# Patient Record
Sex: Female | Born: 1965 | Race: White | Hispanic: No | Marital: Married | Smoking: Former smoker
Health system: Southern US, Community
[De-identification: ages and names within clinical notes are randomized; demographics above are authoritative.]

## PROBLEM LIST (undated history)

## (undated) DIAGNOSIS — K5792 Diverticulitis of intestine, part unspecified, without perforation or abscess without bleeding: Secondary | ICD-10-CM

## (undated) DIAGNOSIS — G40919 Epilepsy, unspecified, intractable, without status epilepticus: Secondary | ICD-10-CM

## (undated) DIAGNOSIS — F32A Depression, unspecified: Secondary | ICD-10-CM

## (undated) DIAGNOSIS — R296 Repeated falls: Secondary | ICD-10-CM

## (undated) DIAGNOSIS — K5190 Ulcerative colitis, unspecified, without complications: Secondary | ICD-10-CM

## (undated) DIAGNOSIS — F329 Major depressive disorder, single episode, unspecified: Secondary | ICD-10-CM

## (undated) DIAGNOSIS — K219 Gastro-esophageal reflux disease without esophagitis: Secondary | ICD-10-CM

## (undated) DIAGNOSIS — R569 Unspecified convulsions: Secondary | ICD-10-CM

## (undated) DIAGNOSIS — R2681 Unsteadiness on feet: Secondary | ICD-10-CM

## (undated) DIAGNOSIS — F4321 Adjustment disorder with depressed mood: Secondary | ICD-10-CM

## (undated) HISTORY — PX: OTHER SURGICAL HISTORY: SHX169

## (undated) HISTORY — PX: APPENDECTOMY: SHX54

## (undated) HISTORY — PX: TONSILLECTOMY: SUR1361

## (undated) HISTORY — PX: CHOLECYSTECTOMY: SHX55

---
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
## 1999-04-26 ENCOUNTER — Emergency Department (HOSPITAL_COMMUNITY): Admission: EM | Admit: 1999-04-26 | Discharge: 1999-04-26 | Payer: Self-pay | Admitting: Emergency Medicine

## 1999-06-02 ENCOUNTER — Inpatient Hospital Stay (HOSPITAL_COMMUNITY): Admission: EM | Admit: 1999-06-02 | Discharge: 1999-06-04 | Payer: Self-pay | Admitting: *Deleted

## 1999-06-02 ENCOUNTER — Encounter: Payer: Self-pay | Admitting: Emergency Medicine

## 2000-01-27 ENCOUNTER — Emergency Department (HOSPITAL_COMMUNITY): Admission: EM | Admit: 2000-01-27 | Discharge: 2000-01-27 | Payer: Self-pay | Admitting: Emergency Medicine

## 2003-01-18 ENCOUNTER — Encounter: Payer: Self-pay | Admitting: Emergency Medicine

## 2003-01-18 ENCOUNTER — Emergency Department (HOSPITAL_COMMUNITY): Admission: EM | Admit: 2003-01-18 | Discharge: 2003-01-18 | Payer: Self-pay | Admitting: Emergency Medicine

## 2003-04-11 ENCOUNTER — Emergency Department (HOSPITAL_COMMUNITY): Admission: EM | Admit: 2003-04-11 | Discharge: 2003-04-11 | Payer: Self-pay | Admitting: Emergency Medicine

## 2003-05-24 ENCOUNTER — Emergency Department (HOSPITAL_COMMUNITY): Admission: EM | Admit: 2003-05-24 | Discharge: 2003-05-24 | Payer: Self-pay | Admitting: Emergency Medicine

## 2003-07-13 ENCOUNTER — Emergency Department (HOSPITAL_COMMUNITY): Admission: AD | Admit: 2003-07-13 | Discharge: 2003-07-13 | Payer: Self-pay | Admitting: Family Medicine

## 2004-01-03 ENCOUNTER — Emergency Department (HOSPITAL_COMMUNITY): Admission: EM | Admit: 2004-01-03 | Discharge: 2004-01-04 | Payer: Self-pay | Admitting: Emergency Medicine

## 2004-01-25 ENCOUNTER — Emergency Department (HOSPITAL_COMMUNITY): Admission: EM | Admit: 2004-01-25 | Discharge: 2004-01-25 | Payer: Self-pay | Admitting: Emergency Medicine

## 2004-06-19 ENCOUNTER — Emergency Department (HOSPITAL_COMMUNITY): Admission: EM | Admit: 2004-06-19 | Discharge: 2004-06-19 | Payer: Self-pay | Admitting: Emergency Medicine

## 2005-02-12 ENCOUNTER — Emergency Department (HOSPITAL_COMMUNITY): Admission: EM | Admit: 2005-02-12 | Discharge: 2005-02-12 | Payer: Self-pay | Admitting: Emergency Medicine

## 2005-05-30 ENCOUNTER — Emergency Department (HOSPITAL_COMMUNITY): Admission: EM | Admit: 2005-05-30 | Discharge: 2005-05-30 | Payer: Self-pay | Admitting: Emergency Medicine

## 2005-07-02 ENCOUNTER — Emergency Department (HOSPITAL_COMMUNITY): Admission: EM | Admit: 2005-07-02 | Discharge: 2005-07-02 | Payer: Self-pay | Admitting: Emergency Medicine

## 2006-10-05 ENCOUNTER — Emergency Department (HOSPITAL_COMMUNITY): Admission: EM | Admit: 2006-10-05 | Discharge: 2006-10-06 | Payer: Self-pay | Admitting: Emergency Medicine

## 2009-06-06 HISTORY — PX: CRANIECTOMY / CRANIOTOMY FOR IMPLANTATION NEUROSTIMULATOR ELECTRODES: SUR321

## 2009-06-06 HISTORY — PX: OTHER SURGICAL HISTORY: SHX169

## 2013-02-10 ENCOUNTER — Emergency Department: Payer: Self-pay | Admitting: Emergency Medicine

## 2013-03-09 ENCOUNTER — Emergency Department: Payer: Self-pay | Admitting: Emergency Medicine

## 2014-02-03 ENCOUNTER — Emergency Department: Payer: Self-pay | Admitting: Emergency Medicine

## 2015-01-29 ENCOUNTER — Emergency Department
Admission: EM | Admit: 2015-01-29 | Discharge: 2015-01-29 | Disposition: A | Discharge status: Home / Self Care | Payer: Medicaid Other | Attending: Emergency Medicine | Admitting: Emergency Medicine

## 2015-01-29 ENCOUNTER — Emergency Department: Payer: Medicaid Other

## 2015-01-29 ENCOUNTER — Encounter: Payer: Self-pay | Admitting: Emergency Medicine

## 2015-01-29 DIAGNOSIS — Y998 Other external cause status: Secondary | ICD-10-CM | POA: Insufficient documentation

## 2015-01-29 DIAGNOSIS — Y9289 Other specified places as the place of occurrence of the external cause: Secondary | ICD-10-CM | POA: Diagnosis not present

## 2015-01-29 DIAGNOSIS — Y9389 Activity, other specified: Secondary | ICD-10-CM | POA: Insufficient documentation

## 2015-01-29 DIAGNOSIS — X58XXXA Exposure to other specified factors, initial encounter: Secondary | ICD-10-CM | POA: Diagnosis not present

## 2015-01-29 DIAGNOSIS — S0990XA Unspecified injury of head, initial encounter: Secondary | ICD-10-CM | POA: Diagnosis not present

## 2015-01-29 DIAGNOSIS — S0093XA Contusion of unspecified part of head, initial encounter: Secondary | ICD-10-CM | POA: Diagnosis not present

## 2015-01-29 DIAGNOSIS — R569 Unspecified convulsions: Secondary | ICD-10-CM

## 2015-01-29 HISTORY — DX: Diverticulitis of intestine, part unspecified, without perforation or abscess without bleeding: K57.92

## 2015-01-29 HISTORY — DX: Gastro-esophageal reflux disease without esophagitis: K21.9

## 2015-01-29 HISTORY — DX: Unspecified convulsions: R56.9

## 2015-01-29 LAB — URINALYSIS COMPLETE WITH MICROSCOPIC (ARMC ONLY)
Bacteria, UA: NONE SEEN
Bilirubin Urine: NEGATIVE
Glucose, UA: NEGATIVE mg/dL
Hgb urine dipstick: NEGATIVE
Ketones, ur: NEGATIVE mg/dL
Leukocytes, UA: NEGATIVE
Nitrite: NEGATIVE
Protein, ur: NEGATIVE mg/dL
Specific Gravity, Urine: 1.025 (ref 1.005–1.030)
pH: 6 (ref 5.0–8.0)

## 2015-01-29 NOTE — Discharge Instructions (Signed)
Head Injury You have a head injury. Headaches and throwing up (vomiting) are common after a head injury. It should be easy to wake up from sleeping. Sometimes you must stay in the hospital. Most problems happen within the first 24 hours. Side effects may occur up to 7-10 days after the injury.  WHAT ARE THE TYPES OF HEAD INJURIES? Head injuries can be as minor as a bump. Some head injuries can be more severe. More severe head injuries include:  A jarring injury to the brain (concussion).  A bruise of the brain (contusion). This mean there is bleeding in the brain that can cause swelling.  A cracked skull (skull fracture).  Bleeding in the brain that collects, clots, and forms a bump (hematoma). WHEN SHOULD I GET HELP RIGHT AWAY?   You are confused or sleepy.  You cannot be woken up.  You feel sick to your stomach (nauseous) or keep throwing up (vomiting).  Your dizziness or unsteadiness is getting worse.  You have very bad, lasting headaches that are not helped by medicine. Take medicines only as told by your doctor.  You cannot use your arms or legs like normal.  You cannot walk.  You notice changes in the black spots in the center of the colored part of your eye (pupil).  You have clear or bloody fluid coming from your nose or ears.  You have trouble seeing. During the next 24 hours after the injury, you must stay with someone who can watch you. This person should get help right away (call 911 in the [place] if you start to shake and are not able to control it (have seizures), you pass out, or you are unable to wake up. HOW CAN I PREVENT A HEAD INJURY IN THE FUTURE?  Wear seat belts.  Wear a helmet while bike riding and playing sports like football.  Stay away from dangerous activities around the house. WHEN CAN I RETURN TO NORMAL ACTIVITIES AND ATHLETICS? See your doctor before doing these activities. You should not do normal activities or play contact sports until 1 week  after the following symptoms have stopped:  Headache that does not go away.  Dizziness.  Poor attention.  Confusion.  Memory problems.  Sickness to your stomach or throwing up.  Tiredness.  Fussiness.  Bothered by bright lights or loud noises.  Anxiousness or depression.  Restless sleep. MAKE SURE YOU:   Understand these instructions.  Will watch your condition.  Will get help right away if you are not doing well or get worse. Document Released: 05/05/2008 Document Revised: 10/07/2013 Document Reviewed: 01/28/2013 [facility] Patient Information 2015 [place], [place]. This information is not intended to replace advice given to you by your health care provider. Make sure you discuss any questions you have with your health care provider.  Seizure, Adult A seizure is abnormal electrical activity in the brain. Seizures usually last from 30 seconds to 2 minutes. There are various types of seizures. Before a seizure, you may have a warning sensation (aura) that a seizure is about to occur. An aura may include the following symptoms:   Fear or anxiety.  Nausea.  Feeling like the room is spinning (vertigo).  Vision changes, such as seeing flashing lights or spots. Common symptoms during a seizure include:  A change in attention or behavior (altered mental status).  Convulsions with rhythmic jerking movements.  Drooling.  Rapid eye movements.  Grunting.  Loss of bladder and bowel control.  Bitter taste in the mouth.  Tongue biting. After a seizure, you may feel confused and sleepy. You may also have an injury resulting from convulsions during the seizure. HOME CARE INSTRUCTIONS   If you are given medicines, take them exactly as prescribed by your health care provider.  Keep all follow-up appointments as directed by your health care provider.  Do not swim or drive or engage in risky activity during which a seizure could cause further injury to you or others until  your health care provider says it is OK.  Get adequate rest.  Teach friends and family what to do if you have a seizure. They should:  Lay you on the ground to prevent a fall.  Put a cushion under your head.  Loosen any tight clothing around your neck.  Turn you on your side. If vomiting occurs, this helps keep your airway clear.  Stay with you until you recover.  Know whether or not you need emergency care. SEEK IMMEDIATE MEDICAL CARE IF:  The seizure lasts longer than 5 minutes.  The seizure is severe or you do not wake up immediately after the seizure.  You have an altered mental status after the seizure.  You are having more frequent or worsening seizures. Someone should drive you to the emergency department or call local emergency services (911 in [place] MAKE SURE YOU:  Understand these instructions.  Will watch your condition.  Will get help right away if you are not doing well or get worse. Document Released: 05/20/2000 Document Revised: 03/13/2013 Document Reviewed: 01/02/2013 [facility] Patient Information 2015 [place], [place]. This information is not intended to replace advice given to you by your health care provider. Make sure you discuss any questions you have with your health care provider.

## 2015-01-29 NOTE — ED Notes (Signed)
Pt. Walked into triage with no difficulty.  Pt. States having 3 seizures today, last one was at 7 pm tonight.  Pt. States she saw her primary today for hives today.  Pt. States she has 5-10 seizures a month.  Pt. States seeing her neurologist within last month.  Pt. States she took hydroxyzine today at around 10 am this morning.  Pt. States she uses a [place] nerve stimulator to help control seizures.

## 2015-01-29 NOTE — ED Provider Notes (Addendum)
[facility] Emergency Department Provider Note  ____________________________________________  Time seen: Approximately 9 PM  I have reviewed the triage vital signs and the nursing notes.   HISTORY  Chief Complaint Seizures    HPI Mandy Deleon is a 49 y.o. female with a history of cluster seizures who is presenting tonight with 4 seizures today as well as left wrist and left sided temple pain. She says that she has multiple types of seizures. Some seizures where she loses consciousness others where she is awake.She says that today she is not sure how she hurt her head. She says that she remembers having a seizure while she was in a chair and then the next time she was aware of her surroundings she was a different close. She suspects that she soiled herself. The patient is on multiple seizure medications as well as having a vagal nerve stimulator. She is seen at [facility] by Dr. Craig Staggers. She says that she hurt her head at about 11 AM this morning and then continued to seize 3 more times after that. She is at her baseline mental status now and is not confused. She says that she also has left-sided wrist pain where she thinks she fell.   Past Medical History  Diagnosis Date  . Seizures   . Diverticulitis   . Acid reflux     There are no active problems to display for this patient.   Past Surgical History  Procedure Laterality Date  . Cholecystectomy    . Vagas nerve stimulator  2011  . Appendectomy    . Tonsillectomy      No current outpatient prescriptions on file.  Allergies Review of patient's allergies indicates not on file.  No family history on file.  Social History Social History  Substance Use Topics  . Smoking status: Never Smoker   . Smokeless tobacco: None  . Alcohol Use: No    Review of Systems Constitutional: No fever/chills Eyes: No visual changes. ENT: No sore throat. Cardiovascular: Denies chest pain. Respiratory: Denies shortness  of breath. Gastrointestinal: No abdominal pain.  No nausea, no vomiting.  No diarrhea.  No constipation. Genitourinary: Negative for dysuria. Musculoskeletal: Negative for back pain. Skin: Excoriations to the bilateral upper extremities. Placed on hydroxyzine and took last dose at 10 AM this morning. Suspects that these lesions are irritation from swimming in a pool. Neurological: Negative for headaches, focal weakness or numbness.  10-point ROS otherwise negative.  ____________________________________________   PHYSICAL EXAM:  VITAL SIGNS: ED Triage Vitals  Enc Vitals Group     BP 01/29/15 2030 113/88 mmHg     Pulse Rate 01/29/15 2030 101     Resp 01/29/15 2030 18     Temp 01/29/15 2030 98.8 F (37.1 C)     Temp Source 01/29/15 2030 Oral     SpO2 01/29/15 2030 96 %     Weight 01/29/15 2030 165 lb (74.844 kg)     Height 01/29/15 2030  (1.6 m)     Head Cir --      Peak Flow --      Pain Score 01/29/15 2032 5     Pain Loc --      Pain Edu? --      Excl. in GC? --     Constitutional: Alert and oriented. Well appearing and in no acute distress. Eyes: Conjunctivae are normal. PERRL. EOMI. Head: Atraumatic. Nose: No congestion/rhinnorhea. Mouth/Throat: Mucous membranes are moist.  Oropharynx non-erythematous. Neck: No stridor.  No tenderness to palpation to the midline C-spine. Ranges freely without any neurologic deficits. Cardiovascular: Normal rate, regular rhythm. Grossly normal heart sounds.  Good peripheral circulation. Respiratory: Normal respiratory effort.  No retractions. Lungs CTAB. Gastrointestinal: Soft with mild suprapubic tenderness palpation. No distention. No abdominal bruits. No CVA tenderness. Musculoskeletal: No lower extremity tenderness nor edema.  No joint effusions. Left sided forearm and wrist without any signs of trauma. 5 out of 5 strength to bilateral upper extremities. No tenderness over the anatomic snuffbox or pain with axial load to the  thumbs. Neurologic:  Normal speech and language. No gross focal neurologic deficits are appreciated. No gait instability. Skin:  Skin is warm, dry and intact. Excoriations which are mild are noted to the bilateral upper extremities. There are no lesions on the trunk. No tracking of the lesions or mite like markings. Psychiatric: Mood and affect are normal. Speech and behavior are normal.  ____________________________________________   LABS (all labs ordered are listed, but only abnormal results are displayed)  Labs Reviewed  URINALYSIS COMPLETEWITH MICROSCOPIC ([facility] ONLY) - Abnormal; Notable for the following:    Color, Urine YELLOW (*)    APPearance HAZY (*)    Squamous Epithelial / LPF 0-5 (*)    All other components within normal limits  CBG MONITORING, ED   CBG of 93. ____________________________________________  EKG   ____________________________________________  RADIOLOGY  Normal head CT. ____________________________________________   PROCEDURES    ____________________________________________   INITIAL IMPRESSION / ASSESSMENT AND PLAN / ED COURSE  Pertinent labs & imaging results that were available during my care of the patient were reviewed by me and considered in my medical decision making (see chart for details).  Ordered urinalysis for this patient. She is not having any abdominal pain that she was aware of until I palpated the suprapubic region. She does have a history of diverticulitis but does not have any left lower quadrant or right lower quadrant tenderness at this time. Denies any nausea vomiting or diarrhea. Has had no appendectomy in the past.  ----------------------------------------- 9:38 PM on 01/29/2015 -----------------------------------------  Discussed the patient's case with Dr.Demarchena of [facility] neurology who reviewed the patient's records. He said that the patient does have clusters of seizures and that when this has happened in the past  she has not had her medications adjusted. He recommended follow-up at the [facility] clinic with Dr. Craig Staggers.   ----------------------------------------- 10:59 PM on 01/29/2015 -----------------------------------------  Patient without any seizures in the emergency department. Resting comfortably, reading on the emergency department bed. Discussed lab as well as imaging results with the patient. We also discussed the need for follow-up with her neurologist at [facility]. She is aware that the on-call neurologist did not recommend any medication changes. We also discussed not swimming as it would be dangerous for her since she has so frequent seizures. The patient understands the plan and is willing to comply. Heart rate 78 on recheck. Patient with no further comply to abdominal pain. Unclear cause. However patient is sipping soda in the room and has no signs of GI distress. ____________________________________________   FINAL CLINICAL IMPRESSION(S) / ED DIAGNOSES  Acute seizure. Acute head contusion. Initial visit.    Myrna Blazer, MD 01/29/15 2300  Myrna Blazer, MD 01/29/15 [phone]

## 2015-01-29 NOTE — ED Notes (Signed)
Pt. 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  Pt. States pain of 7/10.  Pt. Also states landing on lt. Wrist, pt. States pain of 5/10 to lt. Wrist.

## 2015-01-29 NOTE — ED Notes (Signed)
CBG 93 

## 2015-01-29 NOTE — ED Notes (Signed)
Patient transported to CT 

## 2015-01-29 NOTE — ED Notes (Signed)
Seizure pads in place, suction and O2 set up checked

## 2015-01-30 LAB — GLUCOSE, CAPILLARY: GLUCOSE-CAPILLARY: 93 mg/dL (ref 65–99)

## 2015-04-23 LAB — HM COLONOSCOPY

## 2015-08-21 ENCOUNTER — Encounter: Payer: Self-pay | Admitting: Emergency Medicine

## 2015-08-21 ENCOUNTER — Emergency Department
Admission: EM | Admit: 2015-08-21 | Discharge: 2015-08-21 | Disposition: A | Discharge status: Home / Self Care | Payer: Medicaid Other | Attending: Emergency Medicine | Admitting: Emergency Medicine

## 2015-08-21 DIAGNOSIS — Y999 Unspecified external cause status: Secondary | ICD-10-CM | POA: Insufficient documentation

## 2015-08-21 DIAGNOSIS — S01311A Laceration without foreign body of right ear, initial encounter: Secondary | ICD-10-CM | POA: Diagnosis not present

## 2015-08-21 DIAGNOSIS — Y929 Unspecified place or not applicable: Secondary | ICD-10-CM | POA: Insufficient documentation

## 2015-08-21 DIAGNOSIS — W19XXXA Unspecified fall, initial encounter: Secondary | ICD-10-CM | POA: Insufficient documentation

## 2015-08-21 DIAGNOSIS — Y939 Activity, unspecified: Secondary | ICD-10-CM | POA: Insufficient documentation

## 2015-08-21 MED ORDER — LIDOCAINE-EPINEPHRINE (PF) 1 %-1:200000 IJ SOLN
10.0000 mL | Freq: Once | INTRAMUSCULAR | Status: AC
  Administered 2015-08-21: 10 mL
  Filled 2015-08-21: qty 30

## 2015-08-21 NOTE — Discharge Instructions (Signed)
Laceration Care, Adult  A laceration is a cut that goes through all layers of the skin. The cut also goes into the tissue that is right under the skin. Some cuts heal on their own. Others need to be closed with stitches (sutures), staples, skin adhesive strips, or wound glue. Taking care of your cut lowers your risk of infection and helps your cut to heal better.  HOW TO TAKE CARE OF YOUR CUT  For stitches or staples:  · Keep the wound clean and dry.  · If you were given a bandage (dressing), you should change it at least one time per day or as told by your doctor. You should also change it if it gets wet or dirty.  · Keep the wound completely dry for the first 24 hours or as told by your doctor. After that time, you may take a shower or a bath. However, make sure that the wound is not soaked in water until after the stitches or staples have been removed.  · Clean the wound one time each day or as told by your doctor:    Wash the wound with soap and water.    Rinse the wound with water until all of the soap comes off.    Pat the wound dry with a clean towel. Do not rub the wound.  · After you clean the wound, put a thin layer of antibiotic ointment on it as told by your doctor. This ointment:    Helps to prevent infection.    Keeps the bandage from sticking to the wound.  · Have your stitches or staples removed as told by your doctor.  If your doctor used skin adhesive strips:   · Keep the wound clean and dry.  · If you were given a bandage, you should change it at least one time per day or as told by your doctor. You should also change it if it gets dirty or wet.  · Do not get the skin adhesive strips wet. You can take a shower or a bath, but be careful to keep the wound dry.  · If the wound gets wet, pat it dry with a clean towel. Do not rub the wound.  · Skin adhesive strips fall off on their own. You can trim the strips as the wound heals. Do not remove any strips that are still stuck to the wound. They will  fall off after a while.  If your doctor used wound glue:  · Try to keep your wound dry, but you may briefly wet it in the shower or bath. Do not soak the wound in water, such as by swimming.  · After you take a shower or a bath, gently pat the wound dry with a clean towel. Do not rub the wound.  · Do not do any activities that will make you really sweaty until the skin glue has fallen off on its own.  · Do not apply liquid, cream, or ointment medicine to your wound while the skin glue is still on.  · If you were given a bandage, you should change it at least one time per day or as told by your doctor. You should also change it if it gets dirty or wet.  · If a bandage is placed over the wound, do not let the tape for the bandage touch the skin glue.  · Do not pick at the glue. The skin glue usually stays on for 5-10 days. Then, it   or when wound glue stays in place and the wound is healed. Make sure to wear a sunscreen of at least 30 SPF.  Take over-the-counter and prescription medicines only as told by your doctor.  If you were given antibiotic medicine or ointment, take or apply it as told by your doctor. Do not stop using the antibiotic even if your wound is getting better.  Do not scratch or pick at the wound.  Keep all follow-up visits as told by your doctor. This is important.  Check your wound every day for signs of infection. Watch for:  Redness, swelling, or pain.  Fluid, blood, or pus.  Raise (elevate) the injured area above the level of your heart while you are sitting or lying down, if possible. GET HELP IF:  You got a tetanus shot and you have any of these problems at the injection site:  Swelling.  Very bad pain.  Redness.  Bleeding.  You have a fever.  A wound that was  closed breaks open.  You notice a bad smell coming from your wound or your bandage.  You notice something coming out of the wound, such as wood or glass.  Medicine does not help your pain.  You have more redness, swelling, or pain at the site of your wound.  You have fluid, blood, or pus coming from your wound.  You notice a change in the color of your skin near your wound.  You need to change the bandage often because fluid, blood, or pus is coming from the wound.  You start to have a new rash.  You start to have numbness around the wound. GET HELP RIGHT AWAY IF:  You have very bad swelling around the wound.  Your pain suddenly gets worse and is very bad.  You notice painful lumps near the wound or on skin that is anywhere on your body.  You have a red streak going away from your wound.  The wound is on your hand or foot and you cannot move a finger or toe like you usually can.  The wound is on your hand or foot and you notice that your fingers or toes look pale or bluish.   This information is not intended to replace advice given to you by your health care provider. Make sure you discuss any questions you have with your health care provider.   Document Released: 11/09/2007 Document Revised: 10/07/2014 Document Reviewed: 05/19/2014 [employer] Interactive Patient Education 2016 [employer]    WOUND CARE Please return in 10 days to have your stitches/staples removed or sooner if you have concerns.  Keep area clean and dry for 24 hours. Do not remove bandage, if applied.  After 24 hours, remove bandage and wash wound gently with mild soap and warm water. Reapply a new bandage after cleaning wound, if directed.  Continue daily cleansing with soap and water until stitches/staples are removed.  Do not apply any ointments or creams to the wound while stitches/staples are in place, as this may cause delayed healing.  Notify the office if you experience any of the  following signs of infection: Swelling, redness, pus drainage, streaking, fever >101.0 F  Notify the office if you experience excessive bleeding that does not stop after 15-20 minutes of constant, firm pressure.

## 2015-08-21 NOTE — ED Notes (Signed)
States she has a hx of sz and had 1 this afternoon   Laceration to right ear  And pain to right arm

## 2015-08-21 NOTE — ED Notes (Signed)
Pt sts that she cut L ear during seizure this AM.  Pt sts that it is not uncommon for her to have seizures, that she is compliant w/ medications

## 2015-08-21 NOTE — ED Provider Notes (Signed)
[facility] Emergency Department Provider Note ____________________________________________  Time seen: Approximately 3:08 PM  I have reviewed the triage vital signs and the nursing notes.   HISTORY  Chief Complaint Fall   HPI Mandy Deleon is a 50 y.o. female is here after having a seizure this morning. Patient states that she has had some right elbow pain which is more bruised than anything and she still able to move her elbow without any discomfort or restrictions. She does have a laceration to her right ear and and it continues to bleed somewhat. Denies any other injuries.Patient states that her epilepsy has been very difficult to control even with medication. She states that she had as many as over 200 seizures per month and currently with a vagal stimulator implant she is down to 7 per month.   Past Medical History  Diagnosis Date  . Seizures (HCC)   . Diverticulitis   . Acid reflux     There are no active problems to display for this patient.   Past Surgical History  Procedure Laterality Date  . Cholecystectomy    . Vagas nerve stimulator  2011  . Appendectomy    . Tonsillectomy      No current outpatient prescriptions on file.  Allergies Codeine  No family history on file.  Social History Social History  Substance Use Topics  . Smoking status: Never Smoker   . Smokeless tobacco: None  . Alcohol Use: No    Review of Systems Constitutional: No fever/chills Eyes: No visual changes. ENT: No sore throat. Cardiovascular: Denies chest pain. Respiratory: Denies shortness of breath. Gastrointestinal: No abdominal pain.  No nausea, no vomiting.   Musculoskeletal: Negative for back pain.Positive right elbow pain. Skin: Negative for rash. Positive for laceration Neurological: Negative for headaches, focal weakness or numbness.  10-point ROS otherwise negative.  ____________________________________________   PHYSICAL EXAM:  VITAL  SIGNS: ED Triage Vitals  Enc Vitals Group     BP 08/21/15 1451 120/73 mmHg     Pulse Rate 08/21/15 1451 98     Resp 08/21/15 1451 20     Temp 08/21/15 1451 98 F (36.7 C)     Temp Source 08/21/15 1451 Oral     SpO2 08/21/15 1451 100 %     Weight 08/21/15 1451 165 lb (74.844 kg)     Height 08/21/15 1451 5\' 3"  (1.6 m)     Head Cir --      Peak Flow --      Pain Score --      Pain Loc --      Pain Edu? --      Excl. in GC? --     Constitutional: Alert and oriented. Well appearing and in no acute distress. Eyes: Conjunctivae are normal. PERRL. EOMI. Head: Atraumatic. Nose: No congestion/rhinnorhea. Mouth/Throat: Mucous membranes are moist.  Oropharynx non-erythematous. Neck: No stridor.  No cervical tenderness on palpation posteriorly. Range of motion is without pain or restriction. Cardiovascular: Normal rate, regular rhythm. Grossly normal heart sounds.  Good peripheral circulation. Respiratory: Normal respiratory effort.  No retractions. Lungs CTAB. Gastrointestinal: Soft and nontender. No distention.  Musculoskeletal: Patient moves upper and lower extremities without any difficulty. There is a small ecchymotic area to the posterior portion of the right elbow with minimal tenderness on palpation. Range of motion is without restriction or pain. There is no soft tissue edema present. Neurologic:  Normal speech and language. No gross focal neurologic deficits are appreciated. No gait instability.  Skin:  Skin is warm, dry and intact. Laceration to the right earlobe without active bleeding at this time. Psychiatric: Mood and affect are normal. Speech and behavior are normal.  ____________________________________________   LABS (all labs ordered are listed, but only abnormal results are displayed)  Labs Reviewed - No data to display  PROCEDURES  Procedure(s) performed: LACERATION REPAIR Performed by: Tommi Rumps Authorized by: Tommi Rumps Consent: Verbal consent  obtained. Risks and benefits: risks, benefits and alternatives were discussed Consent given by: patient Patient identity confirmed: provided demographic data Prepped and Draped in normal sterile fashion Wound explored  Laceration Location: Right earlobe  Laceration Length: 2 cm  No Foreign Bodies seen or palpated  Anesthesia: local infiltration  Local anesthetic: lidocaine 1 % with epinephrine  Anesthetic total: 2 ml  Irrigation method: syringe Amount of cleaning: standard  Skin closure: 5-0 Ethilon   Number of sutures: 7  Technique: Simple interrupted   Patient tolerance: Patient tolerated the procedure well with no immediate complications.  Critical Care performed: No  ____________________________________________   INITIAL IMPRESSION / ASSESSMENT AND PLAN / ED COURSE  Pertinent labs & imaging results that were available during my care of the patient were reviewed by me and considered in my medical decision making (see chart for details).  Patient is return for suture removal if she is unable to get her family doctor to remove these in 10 days. She'll take Tylenol as needed and use ice for any bruises. ____________________________________________   FINAL CLINICAL IMPRESSION(S) / ED DIAGNOSES  Final diagnoses:  Laceration of right ear lobe, initial encounter      Tommi Rumps, PA-C 08/21/15 1646  Phineas Semen, MD 08/21/15 [phone]

## 2015-09-01 ENCOUNTER — Emergency Department
Admission: EM | Admit: 2015-09-01 | Discharge: 2015-09-01 | Disposition: A | Discharge status: Home / Self Care | Payer: Medicaid Other | Attending: Emergency Medicine | Admitting: Emergency Medicine

## 2015-09-01 ENCOUNTER — Encounter: Payer: Self-pay | Admitting: Emergency Medicine

## 2015-09-01 DIAGNOSIS — Z4802 Encounter for removal of sutures: Secondary | ICD-10-CM | POA: Insufficient documentation

## 2015-09-01 DIAGNOSIS — R569 Unspecified convulsions: Secondary | ICD-10-CM | POA: Diagnosis not present

## 2015-09-01 NOTE — ED Provider Notes (Signed)
[facility] Emergency Department Provider Note  ____________________________________________  Time seen: Approximately 2:14 PM  I have reviewed the triage vital signs and the nursing notes.   HISTORY  Chief Complaint Suture / Staple Removal    HPI Mandy Deleon is a 50 y.o. female presents for suture removal. Patient had 7 stitches placed in her right ear proximal wound control. Denies any complaints at this time.   Past Medical History  Diagnosis Date  . Seizures (HCC)   . Diverticulitis   . Acid reflux     There are no active problems to display for this patient.   Past Surgical History  Procedure Laterality Date  . Cholecystectomy    . Vagas nerve stimulator  2011  . Appendectomy    . Tonsillectomy      No current outpatient prescriptions on file.  Allergies Codeine  No family history on file.  Social History Social History  Substance Use Topics  . Smoking status: Never Smoker   . Smokeless tobacco: None  . Alcohol Use: No    Review of Systems Constitutional: No fever/chills Skin: Positive for stitches right ear. Neurological: Negative for headaches, focal weakness or numbness.  10-point ROS otherwise negative.  ____________________________________________   PHYSICAL EXAM:  VITAL SIGNS: ED Triage Vitals  Enc Vitals Group     BP 09/01/15 1333 123/67 mmHg     Pulse Rate 09/01/15 1333 95     Resp 09/01/15 1333 16     Temp 09/01/15 1333 98.3 F (36.8 C)     Temp Source 09/01/15 1333 Oral     SpO2 09/01/15 1333 100 %     Weight 09/01/15 1333 173 lb (78.472 kg)     Height 09/01/15 1333 5\' 3"  (1.6 m)     Head Cir --      Peak Flow --      Pain Score --      Pain Loc --      Pain Edu? --      Excl. in GC? --     Constitutional: Alert and oriented. Well appearing and in no acute distress. Head: Atraumatic. Right ear with 7 sutures intact. No erythema or drainage or redness noted. Nose: No  congestion/rhinnorhea. Skin:  Skin is warm, dry and intact. No rash noted. Psychiatric: Mood and affect are normal. Speech and behavior are normal.  ____________________________________________   LABS (all labs ordered are listed, but only abnormal results are displayed)  Labs Reviewed - No data to display ____________________________________________    PROCEDURES  Procedure(s) performed: None  Critical Care performed: No  ____________________________________________   INITIAL IMPRESSION / ASSESSMENT AND PLAN / ED COURSE  Pertinent labs & imaging results that were available during my care of the patient were reviewed by me and considered in my medical decision making (see chart for details).  Suture removal right ear. Patient follow-up PCP or return to the ER with any new illnesses. Patient tolerated procedure well. Sutures were removed by ER nurse. ____________________________________________   FINAL CLINICAL IMPRESSION(S) / ED DIAGNOSES  Final diagnoses:  Visit for suture removal     This chart was dictated using voice recognition software/Dragon. Despite best efforts to proofread, errors can occur which can change the meaning. Any change was purely unintentional.   Evangeline Dakinharles M Princessa Lesmeister, PA-C 09/01/15 1706  Rockne MenghiniAnne-Caroline Norman, MD 09/02/15 [phone]

## 2015-09-01 NOTE — Discharge Instructions (Signed)

## 2015-09-01 NOTE — ED Notes (Signed)
Pt here for suture removal

## 2016-06-01 ENCOUNTER — Emergency Department: Payer: Medicaid Other

## 2016-06-01 ENCOUNTER — Emergency Department
Admission: EM | Admit: 2016-06-01 | Discharge: 2016-06-01 | Disposition: A | Discharge status: Home / Self Care | Payer: Medicaid Other | Attending: Emergency Medicine | Admitting: Emergency Medicine

## 2016-06-01 ENCOUNTER — Encounter: Payer: Self-pay | Admitting: Emergency Medicine

## 2016-06-01 DIAGNOSIS — Y929 Unspecified place or not applicable: Secondary | ICD-10-CM | POA: Insufficient documentation

## 2016-06-01 DIAGNOSIS — S0181XA Laceration without foreign body of other part of head, initial encounter: Secondary | ICD-10-CM | POA: Insufficient documentation

## 2016-06-01 DIAGNOSIS — W1800XA Striking against unspecified object with subsequent fall, initial encounter: Secondary | ICD-10-CM | POA: Insufficient documentation

## 2016-06-01 DIAGNOSIS — S0990XA Unspecified injury of head, initial encounter: Secondary | ICD-10-CM | POA: Diagnosis present

## 2016-06-01 DIAGNOSIS — Y999 Unspecified external cause status: Secondary | ICD-10-CM | POA: Diagnosis not present

## 2016-06-01 DIAGNOSIS — G40909 Epilepsy, unspecified, not intractable, without status epilepticus: Secondary | ICD-10-CM | POA: Insufficient documentation

## 2016-06-01 DIAGNOSIS — Y9389 Activity, other specified: Secondary | ICD-10-CM | POA: Insufficient documentation

## 2016-06-01 DIAGNOSIS — Z87891 Personal history of nicotine dependence: Secondary | ICD-10-CM | POA: Insufficient documentation

## 2016-06-01 HISTORY — DX: Ulcerative colitis, unspecified, without complications: K51.90

## 2016-06-01 LAB — CBC
HEMATOCRIT: 40.8 % (ref 35.0–47.0)
Hemoglobin: 14.1 g/dL (ref 12.0–16.0)
MCH: 32.7 pg (ref 26.0–34.0)
MCHC: 34.6 g/dL (ref 32.0–36.0)
MCV: 94.3 fL (ref 80.0–100.0)
PLATELETS: 286 10*3/uL (ref 150–440)
RBC: 4.32 MIL/uL (ref 3.80–5.20)
RDW: 13.2 % (ref 11.5–14.5)
WBC: 7.7 10*3/uL (ref 3.6–11.0)

## 2016-06-01 LAB — BASIC METABOLIC PANEL
Anion gap: 6 (ref 5–15)
BUN: 10 mg/dL (ref 6–20)
CHLORIDE: 103 mmol/L (ref 101–111)
CO2: 26 mmol/L (ref 22–32)
CREATININE: 0.78 mg/dL (ref 0.44–1.00)
Calcium: 8.4 mg/dL — ABNORMAL LOW (ref 8.9–10.3)
GFR calc Af Amer: >60 mL/min (ref >60)
GLUCOSE: 101 mg/dL — AB (ref 65–99)
POTASSIUM: 3.6 mmol/L (ref 3.5–5.1)
Sodium: 135 mmol/L (ref 135–145)

## 2016-06-01 LAB — URINALYSIS, COMPLETE (UACMP) WITH MICROSCOPIC
Bilirubin Urine: NEGATIVE
Glucose, UA: NEGATIVE mg/dL
Ketones, ur: NEGATIVE mg/dL
Leukocytes, UA: NEGATIVE
Nitrite: NEGATIVE
PH: 6 (ref 5.0–8.0)
Protein, ur: NEGATIVE mg/dL
SPECIFIC GRAVITY, URINE: 1.011 (ref 1.005–1.030)

## 2016-06-01 LAB — POC URINE PREG, ED: PREG TEST UR: NEGATIVE

## 2016-06-01 MED ORDER — LEVETIRACETAM 500 MG/5ML IV SOLN
1000.0000 mg | INTRAVENOUS | Status: AC
  Administered 2016-06-01: 1000 mg via INTRAVENOUS
  Filled 2016-06-01: qty 10

## 2016-06-01 NOTE — ED Triage Notes (Signed)
Patient states that she has had 2 seizures since 18:45 tonight. Patient states that she has a history of seizures and that her last seizure was about 2 weeks ago. Patient states that she hit her head during the first seizure. Patient with lacartion above right eye and bruising to right eye. Patient denies taking any anticoagulants.

## 2016-06-01 NOTE — ED Provider Notes (Signed)
[facility] Emergency Department Provider Note   ____________________________________________   First MD Initiated Contact with Patient 06/01/16 2307     (approximate)  I have reviewed the triage vital signs and the nursing notes.   HISTORY  Chief Complaint Seizures and Head Injury    HPI Mandy Deleon is a 50 y.o. female who presents to the ED from home with a chief complaint of seizure. Patient has a long-standing history of seizures, on 3 antiepileptics as well as vagal nerve stimulator. Reports 2 seizures since 82645 PMwhich is not unusual for her. Struck her head above her right eyebrow and presents a laceration and bruising to her right eye. Denies taking anticoagulants. Reports taking medications as directed by her neurologist. Denies recent fever, chills, chest pain, shortness of breath, abdominal pain, nausea, vomiting, diarrhea. Denies recent travel. Nothing makes her symptoms better or worse. Tetanus is up-to-date.   Past Medical History:  Diagnosis Date  . Acid reflux   . Diverticulitis   . Seizures (HCC)   . Ulcerative colitis (HCC)     There are no active problems to display for this patient.   Past Surgical History:  Procedure Laterality Date  . APPENDECTOMY    . CHOLECYSTECTOMY    . TONSILLECTOMY    . vagas nerve stimulator  2011    Prior to Admission medications   Not on File    Allergies Codeine and Morphine and related  No family history on file.  Social History Social History  Substance Use Topics  . Smoking status: Former Games developermoker  . Smokeless tobacco: Never Used  . Alcohol use No    Review of Systems  Constitutional: No fever/chills. Eyes: No visual changes. ENT: Positive for facial laceration. No sore throat. Cardiovascular: Denies chest pain. Respiratory: Denies shortness of breath. Gastrointestinal: No abdominal pain.  No nausea, no vomiting.  No diarrhea.  No constipation. Genitourinary: Negative for  dysuria. Musculoskeletal: Negative for back pain. Skin: Negative for rash. Neurological: Positive for seizure. Negative for headaches, focal weakness or numbness.  10-point ROS otherwise negative.  ____________________________________________   PHYSICAL EXAM:  VITAL SIGNS: ED Triage Vitals  Enc Vitals Group     BP 06/01/16 2037 138/83     Pulse Rate 06/01/16 2037 (!) 114     Resp 06/01/16 2037 18     Temp 06/01/16 2037 98.3 F (36.8 C)     Temp Source 06/01/16 2037 Oral     SpO2 06/01/16 2037 99 %     Weight 06/01/16 2042 170 lb (77.1 kg)     Height 06/01/16 2042 5\' 3"  (1.6 m)     Head Circumference --      Peak Flow --      Pain Score 06/01/16 2042 7     Pain Loc --      Pain Edu? --      Excl. in GC? --     Constitutional: Alert and oriented. Well appearing and in no acute distress. Eyes: Conjunctivae are normal. PERRL. EOMI. Head: 1 cm well approximated superficial laceration above right lateral eyebrow which is nonbleeding and non-gaping. Nose: No congestion/rhinnorhea. Mouth/Throat: Mucous membranes are moist.  Oropharynx non-erythematous. Neck: No stridor.  No cervical spine tenderness to palpation. Cardiovascular: Normal rate, regular rhythm. Grossly normal heart sounds.  Good peripheral circulation. Respiratory: Normal respiratory effort.  No retractions. Lungs CTAB. Gastrointestinal: Soft and nontender. No distention. No abdominal bruits. No CVA tenderness. Musculoskeletal: No lower extremity tenderness nor edema.  No  joint effusions. Neurologic:  Normal speech and language. No gross focal neurologic deficits are appreciated. No gait instability. Ambulates to commode with steady gait. Skin:  Skin is warm, dry and intact. No rash noted. Psychiatric: Mood and affect are normal. Speech and behavior are normal.  ____________________________________________   LABS (all labs ordered are listed, but only abnormal results are displayed)  Labs Reviewed  BASIC  METABOLIC PANEL - Abnormal; Notable for the following:       Result Value   Glucose, Bld 101 (*)    Calcium 8.4 (*)    All other components within normal limits  URINALYSIS, COMPLETE (UACMP) WITH MICROSCOPIC - Abnormal; Notable for the following:    Color, Urine YELLOW (*)    APPearance CLEAR (*)    Hgb urine dipstick SMALL (*)    Bacteria, UA RARE (*)    Squamous Epithelial / LPF 0-5 (*)    All other components within normal limits  CBC  POC URINE PREG, ED   ____________________________________________  EKG  ED ECG REPORT I, Blakley Michna J, the attending physician, personally viewed and interpreted this ECG.   Date: 06/01/2016  EKG Time: 2128  Rate: 93  Rhythm: normal EKG, normal sinus rhythm  Axis: Normal  Intervals:none  ST&T Change: Nonspecific  ____________________________________________  RADIOLOGY  CT head without contrast interpreted per Dr. Manus GunningEhinger: No acute intracranial abnormality. ____________________________________________   PROCEDURES  Procedure(s) performed: None  Procedures  Critical Care performed: No  ____________________________________________   INITIAL IMPRESSION / ASSESSMENT AND PLAN / ED COURSE  Pertinent labs & imaging results that were available during my care of the patient were reviewed by me and considered in my medical decision making (see chart for details).  50 year old female with an extensive history of seizure disorder who presents with seizure. Laboratory, urinalysis and imaging studies are unremarkable. She was given IV Keppra prior to my arrival. Discussed with patient increasing her Keppra dose; she is unwilling to change any of her medications at this time. She tells me her neurologist is trying to titrate her off of Keppra while increasing her other medications. She has a scheduled follow-up appointment on January 10 and wishes to maintain her current medications until she sees her neurologist. Superficial laceration was  cleansed and Steri-Strip applied by nurse. Strict return precautions given. Patient verbalizes understanding and agrees with plan of care.  Clinical Course      ____________________________________________   FINAL CLINICAL IMPRESSION(S) / ED DIAGNOSES  Final diagnoses:  Seizure disorder (HCC)  Minor head injury, initial encounter  Facial laceration, initial encounter      NEW MEDICATIONS STARTED DURING THIS VISIT:  New Prescriptions   No medications on file     Note:  This document was prepared using Dragon voice recognition software and may include unintentional dictation errors.    Irean HongJade J Laasya Peyton, MD 06/02/16 [phone]

## 2016-06-01 NOTE — ED Notes (Signed)
Dr. Sung at bedside.  

## 2016-06-01 NOTE — ED Notes (Signed)
Pt up to toilet and reports dizziness, pt reports dizziness earlier today as well

## 2016-06-01 NOTE — ED Notes (Signed)
Blankets placed on side rails for safety, suction and oxygen available in room

## 2016-06-01 NOTE — Discharge Instructions (Signed)
Continue all medications as directed by your neurologist. Steri-Strip will fall off by itself. Return to the ER for recurrent or worsening symptoms, persistent vomiting, difficult breathing or other concerns.

## 2016-06-01 NOTE — ED Notes (Signed)
Forehead cleaned with sterile saline, 2 steri-strips applied

## 2016-06-27 ENCOUNTER — Emergency Department: Payer: Medicaid Other

## 2016-06-27 ENCOUNTER — Emergency Department
Admission: EM | Admit: 2016-06-27 | Discharge: 2016-06-28 | Disposition: A | Discharge status: Home / Self Care | Payer: Medicaid Other | Attending: Emergency Medicine | Admitting: Emergency Medicine

## 2016-06-27 ENCOUNTER — Encounter: Payer: Self-pay | Admitting: Emergency Medicine

## 2016-06-27 DIAGNOSIS — W1809XA Striking against other object with subsequent fall, initial encounter: Secondary | ICD-10-CM | POA: Insufficient documentation

## 2016-06-27 DIAGNOSIS — G40909 Epilepsy, unspecified, not intractable, without status epilepticus: Secondary | ICD-10-CM | POA: Diagnosis not present

## 2016-06-27 DIAGNOSIS — Z87891 Personal history of nicotine dependence: Secondary | ICD-10-CM | POA: Diagnosis not present

## 2016-06-27 DIAGNOSIS — Y999 Unspecified external cause status: Secondary | ICD-10-CM | POA: Insufficient documentation

## 2016-06-27 DIAGNOSIS — S0990XA Unspecified injury of head, initial encounter: Secondary | ICD-10-CM | POA: Diagnosis not present

## 2016-06-27 DIAGNOSIS — R569 Unspecified convulsions: Secondary | ICD-10-CM

## 2016-06-27 DIAGNOSIS — Y92031 Bathroom in apartment as the place of occurrence of the external cause: Secondary | ICD-10-CM | POA: Insufficient documentation

## 2016-06-27 DIAGNOSIS — Y939 Activity, unspecified: Secondary | ICD-10-CM | POA: Diagnosis not present

## 2016-06-27 LAB — CBC WITH DIFFERENTIAL/PLATELET
BASOS ABS: 0 10*3/uL (ref 0–0.1)
Basophils Relative: 1 %
EOS ABS: 0 10*3/uL (ref 0–0.7)
EOS PCT: 0 %
HCT: 41.4 % (ref 35.0–47.0)
Hemoglobin: 14.2 g/dL (ref 12.0–16.0)
Lymphocytes Relative: 35 %
Lymphs Abs: 2 10*3/uL (ref 1.0–3.6)
MCH: 32.5 pg (ref 26.0–34.0)
MCHC: 34.3 g/dL (ref 32.0–36.0)
MCV: 94.8 fL (ref 80.0–100.0)
MONO ABS: 0.6 10*3/uL (ref 0.2–0.9)
Monocytes Relative: 11 %
Neutro Abs: 3.1 10*3/uL (ref 1.4–6.5)
Neutrophils Relative %: 53 %
PLATELETS: 296 10*3/uL (ref 150–440)
RBC: 4.37 MIL/uL (ref 3.80–5.20)
RDW: 12.9 % (ref 11.5–14.5)
WBC: 5.7 10*3/uL (ref 3.6–11.0)

## 2016-06-27 LAB — URINALYSIS, COMPLETE (UACMP) WITH MICROSCOPIC
Bilirubin Urine: NEGATIVE
GLUCOSE, UA: NEGATIVE mg/dL
KETONES UR: NEGATIVE mg/dL
Leukocytes, UA: NEGATIVE
Nitrite: NEGATIVE
Protein, ur: NEGATIVE mg/dL
SPECIFIC GRAVITY, URINE: 1.017 (ref 1.005–1.030)
pH: 5 (ref 5.0–8.0)

## 2016-06-27 LAB — BASIC METABOLIC PANEL
ANION GAP: 10 (ref 5–15)
BUN: 11 mg/dL (ref 6–20)
CO2: 22 mmol/L (ref 22–32)
Calcium: 8.5 mg/dL — ABNORMAL LOW (ref 8.9–10.3)
Chloride: 101 mmol/L (ref 101–111)
Creatinine, Ser: 0.73 mg/dL (ref 0.44–1.00)
GFR calc Af Amer: >60 mL/min (ref >60)
Glucose, Bld: 95 mg/dL (ref 65–99)
POTASSIUM: 3.3 mmol/L — AB (ref 3.5–5.1)
SODIUM: 133 mmol/L — AB (ref 135–145)

## 2016-06-27 MED ORDER — LEVETIRACETAM 500 MG PO TABS
1500.0000 mg | ORAL_TABLET | Freq: Once | ORAL | Status: AC
  Administered 2016-06-27: 1500 mg via ORAL
  Filled 2016-06-27: qty 3

## 2016-06-27 NOTE — ED Provider Notes (Signed)
[facility] Emergency Department Provider Note   ____________________________________________   First MD Initiated Contact with Patient 06/27/16 2224     (approximate)  I have reviewed the triage vital signs and the nursing notes.   HISTORY  Chief Complaint Seizures and Head Injury    HPI Mandy Deleon is a 51 y.o. female who presents to the emergency department for evaluation after having 2 seizures within the past 12 hours. One seizure was last night where she must have fallen and hit her head in the bathroom on the counter or bathtub. She has a long-standing history of epilepsy and denies missing any doses of her medications. She does state that her neurologist is attempting to decrease her dose of Keppra, but denies any recent changes to her medication regimen. She states that she did follow up with him on January 10 as scheduled. No medication changes at that visit. Since the most recent seizure at around 420 this afternoon, she has returned to her baseline and now denies confusion, nausea, vomiting, dizziness, or any symptom outside of her normal postictal state. She is mainly concerned about a contusion and "dent" on the left side of her forehead.   Past Medical History:  Diagnosis Date  . Acid reflux   . Diverticulitis   . Seizures (HCC)   . Ulcerative colitis (HCC)     There are no active problems to display for this patient.   Past Surgical History:  Procedure Laterality Date  . APPENDECTOMY    . CHOLECYSTECTOMY    . TONSILLECTOMY    . vagas nerve stimulator  2011    Prior to Admission medications   Not on File    Allergies Adhesive [tape]; Codeine; Morphine and related; and Iodine  No family history on file.  Social History Social History  Substance Use Topics  . Smoking status: Former Games developer  . Smokeless tobacco: Never Used  . Alcohol use No    Review of Systems Constitutional: No fever/chills Eyes: No visual  changes. ENT: No sore throat. Cardiovascular: Denies chest pain. Respiratory: Denies shortness of breath. Gastrointestinal: No abdominal pain.  No nausea, no vomiting.  No diarrhea. Genitourinary: Negative for dysuria. Musculoskeletal: Negative for back pain. Skin: Negative for rash, lesion, or wound. Neurological: Negative for headaches, focal weakness or numbness. ____________________________________________   PHYSICAL EXAM:  VITAL SIGNS: ED Triage Vitals [06/27/16 1849]  Enc Vitals Group     BP 127/67     Pulse Rate 95     Resp 18     Temp 98.1 F (36.7 C)     Temp Source Oral     SpO2 100 %     Weight 170 lb (77.1 kg)     Height 5\' 3"  (1.6 m)     Head Circumference      Peak Flow      Pain Score 5     Pain Loc      Pain Edu?      Excl. in GC?     Constitutional: Alert and oriented. Well appearing and in no acute distress. Eyes: Conjunctivae are normal. PERRL. EOMI. Head: Small contusion divided by a depression in the center. No bony  Nose: No congestion/rhinnorhea. Mouth/Throat: Mucous membranes are moist.  Oropharynx non-erythematous. Neck: No stridor.   Cardiovascular: Normal rate, regular rhythm. Grossly normal heart sounds.  Good peripheral circulation. Respiratory: Normal respiratory effort.  No retractions. Lungs CTAB. Gastrointestinal: Soft and nontender. No distention. No abdominal bruits. No CVA tenderness.  Musculoskeletal: No lower extremity tenderness nor edema.  No joint effusions. Neurologic:  Normal speech and language. No gross focal neurologic deficits are appreciated. No gait instability. Skin:  Skin is warm, dry and intact. No rash noted. Psychiatric: Mood and affect are normal. Speech and behavior are normal.  ____________________________________________   LABS (all labs ordered are listed, but only abnormal results are displayed)  Labs Reviewed  BASIC METABOLIC PANEL - Abnormal; Notable for the following:       Result Value   Sodium 133  (*)    Potassium 3.3 (*)    Calcium 8.5 (*)    All other components within normal limits  URINALYSIS, COMPLETE (UACMP) WITH MICROSCOPIC - Abnormal; Notable for the following:    Color, Urine YELLOW (*)    APPearance CLEAR (*)    Hgb urine dipstick SMALL (*)    Bacteria, UA RARE (*)    Squamous Epithelial / LPF 0-5 (*)    All other components within normal limits  CBC WITH DIFFERENTIAL/PLATELET   ____________________________________________  EKG   ____________________________________________  RADIOLOGY  Facial bones negative for displaced skull fracture per radiology. ____________________________________________   PROCEDURES  Procedure(s) performed: None  Procedures  Critical Care performed: No  ____________________________________________   INITIAL IMPRESSION / ASSESSMENT AND PLAN / ED COURSE  Pertinent labs & imaging results that were available during my care of the patient were reviewed by me and considered in my medical decision making (see chart for details).  Patient was given 1500 mg of Keppra while in the emergency department and advised that she will need to contact her neurologist for further instruction on any medication adjustments. She states that she is unable to tolerate diazepam and most other aniti-seizure medications. She states it causes her to feel like she is floating. She was instructed to return to the ER for symptoms that change or worsen if unable to schedule an appointment.      ____________________________________________   FINAL CLINICAL IMPRESSION(S) / ED DIAGNOSES  Final diagnoses:  Seizure (HCC)  Closed head injury, initial encounter      NEW MEDICATIONS STARTED DURING THIS VISIT:  There are no discharge medications for this patient.    Note:  This document was prepared using Dragon voice recognition software and may include unintentional dictation errors.    Chinita PesterCari B Crist Kruszka, FNP 06/29/16 1124    Jennye MoccasinBrian S Quigley,  MD 07/01/16 1444

## 2016-06-27 NOTE — ED Notes (Signed)
Pt. States during seizure last night must have fallen onto counter or tub in bathroom across from of throat, c/o pain, no difficulty swallowing. Appears WNL  Pt. States hitting head today during seizure, bruising present to left forehead.

## 2016-06-27 NOTE — ED Triage Notes (Signed)
Patient states she has had two seizures in the past 12 hours.  Last one was today around 1620.  Patient states today she hit head.  Patient has history of epilepsy and usually will have 7-8 seizures per month.  Reports no medicine changes.

## 2016-06-27 NOTE — Discharge Instructions (Signed)
Please call your neurologist's office tomorrow and advise him of your most recent seizure events. Return to the ER for symptoms that change or worsen or for new concerns.

## 2019-02-16 ENCOUNTER — Emergency Department
Admission: EM | Admit: 2019-02-16 | Discharge: 2019-02-16 | Disposition: A | Discharge status: Home / Self Care | Payer: Medicare Other | Attending: Emergency Medicine | Admitting: Emergency Medicine

## 2019-02-16 ENCOUNTER — Other Ambulatory Visit: Payer: Self-pay

## 2019-02-16 ENCOUNTER — Emergency Department: Payer: Medicare Other

## 2019-02-16 DIAGNOSIS — X58XXXA Exposure to other specified factors, initial encounter: Secondary | ICD-10-CM | POA: Diagnosis not present

## 2019-02-16 DIAGNOSIS — Z87891 Personal history of nicotine dependence: Secondary | ICD-10-CM | POA: Diagnosis not present

## 2019-02-16 DIAGNOSIS — S0181XA Laceration without foreign body of other part of head, initial encounter: Secondary | ICD-10-CM | POA: Diagnosis not present

## 2019-02-16 DIAGNOSIS — Y929 Unspecified place or not applicable: Secondary | ICD-10-CM | POA: Insufficient documentation

## 2019-02-16 DIAGNOSIS — R569 Unspecified convulsions: Secondary | ICD-10-CM | POA: Diagnosis not present

## 2019-02-16 DIAGNOSIS — Y999 Unspecified external cause status: Secondary | ICD-10-CM | POA: Diagnosis not present

## 2019-02-16 DIAGNOSIS — S01311A Laceration without foreign body of right ear, initial encounter: Secondary | ICD-10-CM | POA: Diagnosis not present

## 2019-02-16 DIAGNOSIS — Y9389 Activity, other specified: Secondary | ICD-10-CM | POA: Insufficient documentation

## 2019-02-16 LAB — CBC WITH DIFFERENTIAL/PLATELET
Abs Immature Granulocytes: 0.03 10*3/uL (ref 0.00–0.07)
Basophils Absolute: 0 10*3/uL (ref 0.0–0.1)
Basophils Relative: 0 %
Eosinophils Absolute: 0.1 10*3/uL (ref 0.0–0.5)
Eosinophils Relative: 1 %
HCT: 38.7 % (ref 36.0–46.0)
Hemoglobin: 13.1 g/dL (ref 12.0–15.0)
Immature Granulocytes: 0 %
Lymphocytes Relative: 12 %
Lymphs Abs: 1.1 10*3/uL (ref 0.7–4.0)
MCH: 32.4 pg (ref 26.0–34.0)
MCHC: 33.9 g/dL (ref 30.0–36.0)
MCV: 95.8 fL (ref 80.0–100.0)
Monocytes Absolute: 0.7 10*3/uL (ref 0.1–1.0)
Monocytes Relative: 8 %
Neutro Abs: 7.1 10*3/uL (ref 1.7–7.7)
Neutrophils Relative %: 79 %
Platelets: 283 10*3/uL (ref 150–400)
RBC: 4.04 MIL/uL (ref 3.87–5.11)
RDW: 12.5 % (ref 11.5–15.5)
WBC: 9.1 10*3/uL (ref 4.0–10.5)
nRBC: 0 % (ref 0.0–0.2)

## 2019-02-16 LAB — BASIC METABOLIC PANEL
Anion gap: 8 (ref 5–15)
BUN: 17 mg/dL (ref 6–20)
CO2: 23 mmol/L (ref 22–32)
Calcium: 9.2 mg/dL (ref 8.9–10.3)
Chloride: 106 mmol/L (ref 98–111)
Creatinine, Ser: 0.95 mg/dL (ref 0.44–1.00)
GFR calc Af Amer: >60 mL/min (ref >60)
GFR calc non Af Amer: >60 mL/min (ref >60)
Glucose, Bld: 104 mg/dL — ABNORMAL HIGH (ref 70–99)
Potassium: 4.4 mmol/L (ref 3.5–5.1)
Sodium: 137 mmol/L (ref 135–145)

## 2019-02-16 NOTE — ED Triage Notes (Signed)
As per ems patient called ems with c/o of having sz. Upon ems arrival patient sitting on front porch awaiting ems. Non postictal upon ed arrival. Patient reports had a sz about 1800 today. Presents with small lac to right eyebrow was also bleeding from her tongue. Patient reports compliant with sz meds.

## 2019-02-16 NOTE — ED Provider Notes (Signed)
[facility] Emergency Department Provider Note  ____________________________________________   First MD Initiated Contact with Patient 02/16/19 1952     (approximate)  I have reviewed the triage vital signs and the nursing notes.   HISTORY  Chief Complaint Seizures    HPI Mandy Deleon is a 53 y.o. female with history of seizure disorder, ulcerative colitis, vagus nerve stimulator, here with seizure.  The patient states she has an approximately 1 seizure per week.  Her last seizure was approximately 2 weeks ago.  She was in her usual state of health.  She states that she took her dog for a walk.  She returned home.  She then felt something in her ear and face, as well as some mild neck pain, so she  checked in the mirror and noticed that she had bleeding from her right forehead and ear.  She reports associated sharp, stabbing pain whenever she touches it.  Chest is mild anterior neck pain that seems slightly worse with swallowing but no actual difficulty swallowing.  No difficulty breathing.  She is otherwise well-appearing.  She has not had any preceding fever, chills, or infectious symptoms.  She denies any recent missed doses of her medication.  As mentioned, she has seizures approximately once per week, and states this is not necessarily abnormal for her.  She would like to defer any changes in her medication.       Past Medical History:  Diagnosis Date  . Acid reflux   . Diverticulitis   . Seizures ([place])   . Ulcerative colitis ([place])     There are no active problems to display for this patient.   Past Surgical History:  Procedure Laterality Date  . APPENDECTOMY    . CHOLECYSTECTOMY    . TONSILLECTOMY    . vagas nerve stimulator  2011    Prior to Admission medications   Not on File    Allergies Adhesive [tape], Codeine, Morphine and related, and Iodine  History reviewed. No pertinent family history.  Social History Social History    Tobacco Use  . Smoking status: Former Research scientist (life sciences)  . Smokeless tobacco: Never Used  Substance Use Topics  . Alcohol use: No  . Drug use: No    Review of Systems  Review of Systems  Constitutional: Negative for fatigue and fever.  HENT: Negative for congestion and sore throat.   Eyes: Negative for visual disturbance.  Respiratory: Negative for cough and shortness of breath.   Cardiovascular: Negative for chest pain.  Gastrointestinal: Negative for abdominal pain, diarrhea, nausea and vomiting.  Genitourinary: Negative for flank pain.  Musculoskeletal: Negative for back pain and neck pain.  Skin: Positive for wound. Negative for rash.  Neurological: Positive for seizures. Negative for weakness.  All other systems reviewed and are negative.    ____________________________________________  PHYSICAL EXAM:      VITAL SIGNS: ED Triage Vitals [02/16/19 1957]  Enc Vitals Group     BP 125/78     Pulse Rate 94     Resp 18     Temp 98.6 F (37 C)     Temp Source Oral     SpO2 100 %     Weight 165 lb (74.8 kg)     Height 5\' 3"  (1.6 m)     Head Circumference      Peak Flow      Pain Score 5     Pain Loc      Pain Edu?  Excl. in GC?      Physical Exam Vitals signs and nursing note reviewed.  Constitutional:      General: She is not in acute distress.    Appearance: She is well-developed.  HENT:     Head: Normocephalic and atraumatic.     Comments: Approximately 1.5 cm linear laceration on the right lateral brow.  No obvious crepitance or deformity.  A avulsion/superficial laceration to the right earlobe at the area of her previous earring.  No active bleeding.  No laceration on the posterior aspect of the ear. Eyes:     Conjunctiva/sclera: Conjunctivae normal.  Neck:     Musculoskeletal: Neck supple.     Comments: Mild tenderness to palpation over the paraspinal areas.  No midline tenderness.  No anterior neck bruising, ecchymosis, hematoma, or abnormality on exam.  Cardiovascular:     Rate and Rhythm: Normal rate and regular rhythm.     Heart sounds: Normal heart sounds. No murmur. No friction rub.  Pulmonary:     Effort: Pulmonary effort is normal. No respiratory distress.     Breath sounds: Normal breath sounds. No wheezing or rales.  Abdominal:     General: There is no distension.     Palpations: Abdomen is soft.     Tenderness: There is no abdominal tenderness.  Skin:    General: Skin is warm.     Capillary Refill: Capillary refill takes less than 2 seconds.  Neurological:     Mental Status: She is alert and oriented to person, place, and time.     Motor: No abnormal muscle tone.       ____________________________________________   LABS (all labs ordered are listed, but only abnormal results are displayed)  Labs Reviewed  BASIC METABOLIC PANEL - Abnormal; Notable for the following components:      Result Value   Glucose, Bld 104 (*)    All other components within normal limits  CBC WITH DIFFERENTIAL/PLATELET    ____________________________________________  EKG: None ________________________________________  RADIOLOGY All imaging, including plain films, CT scans, and ultrasounds, independently reviewed by me, and interpretations confirmed via formal radiology reads.  ED MD interpretation:   CT Head: Neg CT Spine: Neg  Official radiology report(s): Ct Head Wo Contrast  Result Date: 02/16/2019 CLINICAL DATA:  Headache seizure EXAM: CT HEAD WITHOUT CONTRAST CT CERVICAL SPINE WITHOUT CONTRAST TECHNIQUE: Multidetector CT imaging of the head and cervical spine was performed following the standard protocol without intravenous contrast. Multiplanar CT image reconstructions of the cervical spine were also generated. COMPARISON:  CT 06/02/2016 FINDINGS: CT HEAD FINDINGS Brain: Extensive artifact from right-sided stimulator generator limits the exam. No gross territorial infarct hemorrhage or intracranial mass. Lead tips visible at the  medial temporal lobes. Suspicion of small amount of hypodensity/encephalomalacia at the right temporal lobe. Mildly prominent ventricles, similar compared to prior. Mild atrophy. Vascular: No hyperdense vessels.  No unexpected calcification. Skull: Interval right craniotomy changes and placement of generator. Sinuses/Orbits: No acute finding. Other: None CT CERVICAL SPINE FINDINGS Alignment: Straightening of the cervical spine. No subluxation. Facet alignment within normal limits. Skull base and vertebrae: No acute fracture. No primary bone lesion or focal pathologic process. Soft tissues and spinal canal: Within normal limits Disc levels:  No significant disc space narrowing Upper chest: Negative. Other: None IMPRESSION: 1. Limited by extensive artifact from right-sided generator. Negative for acute intracranial hemorrhage or mass. 2. Right-sided generator has been placed in the interim. There are 2 intracranial leads that enter via posterior  burr holes with the lead tips terminating at the medial temporal lobes bilaterally. Suspect small amount of encephalomalacia in the right temporal lobe. Interval extensive right craniotomy changes. 3. Straightening of the cervical spine. No acute osseous abnormality. Electronically Signed   By: Jasmine Pang M.D.   On: 02/16/2019 21:54   Ct Cervical Spine Wo Contrast  Result Date: 02/16/2019 CLINICAL DATA:  Headache seizure EXAM: CT HEAD WITHOUT CONTRAST CT CERVICAL SPINE WITHOUT CONTRAST TECHNIQUE: Multidetector CT imaging of the head and cervical spine was performed following the standard protocol without intravenous contrast. Multiplanar CT image reconstructions of the cervical spine were also generated. COMPARISON:  CT 06/02/2016 FINDINGS: CT HEAD FINDINGS Brain: Extensive artifact from right-sided stimulator generator limits the exam. No gross territorial infarct hemorrhage or intracranial mass. Lead tips visible at the medial temporal lobes. Suspicion of small amount  of hypodensity/encephalomalacia at the right temporal lobe. Mildly prominent ventricles, similar compared to prior. Mild atrophy. Vascular: No hyperdense vessels.  No unexpected calcification. Skull: Interval right craniotomy changes and placement of generator. Sinuses/Orbits: No acute finding. Other: None CT CERVICAL SPINE FINDINGS Alignment: Straightening of the cervical spine. No subluxation. Facet alignment within normal limits. Skull base and vertebrae: No acute fracture. No primary bone lesion or focal pathologic process. Soft tissues and spinal canal: Within normal limits Disc levels:  No significant disc space narrowing Upper chest: Negative. Other: None IMPRESSION: 1. Limited by extensive artifact from right-sided generator. Negative for acute intracranial hemorrhage or mass. 2. Right-sided generator has been placed in the interim. There are 2 intracranial leads that enter via posterior burr holes with the lead tips terminating at the medial temporal lobes bilaterally. Suspect small amount of encephalomalacia in the right temporal lobe. Interval extensive right craniotomy changes. 3. Straightening of the cervical spine. No acute osseous abnormality. Electronically Signed   By: Jasmine Pang M.D.   On: 02/16/2019 21:54    ____________________________________________  PROCEDURES   Procedure(s) performed (including Critical Care):  Marland KitchenMarland KitchenLaceration Repair  Date/Time: 02/17/2019 1:17 AM Performed by: Shaune Pollack, MD Authorized by: Jene Every, MD   Consent:    Consent obtained:  Verbal   Consent given by:  Patient   Risks discussed:  Infection, need for additional repair, pain, tendon damage, retained foreign body, vascular damage, poor cosmetic result, poor wound healing and nerve damage   Alternatives discussed:  Referral and delayed treatment Anesthesia (see MAR for exact dosages):    Anesthesia method:  None Laceration details:    Location: Right brow.   Length (cm):  1.5 Repair  type:    Repair type:  Simple Pre-procedure details:    Preparation:  Patient was prepped and draped in usual sterile fashion and imaging obtained to evaluate for foreign bodies Exploration:    Hemostasis achieved with:  Direct pressure   Wound exploration: wound explored through full range of motion and entire depth of wound probed and visualized     Wound extent: no areolar tissue violation noted, no fascia violation noted, no nerve damage noted, no tendon damage noted and no underlying fracture noted   Treatment:    Area cleansed with:  Betadine   Amount of cleaning:  Extensive   Irrigation solution:  Sterile water   Irrigation method:  Pressure wash Skin repair:    Repair method:  Tissue adhesive Approximation:    Approximation:  Close Post-procedure details:    Dressing:  Open (no dressing)   Patient tolerance of procedure:  Tolerated well, no immediate complications  ____________________________________________  INITIAL IMPRESSION / MDM / ASSESSMENT AND PLAN / ED COURSE  As part of my medical decision making, I reviewed the following data within the electronic MEDICAL RECORD NUMBER Notes from prior ED visits and [place] Controlled Substance Database      *Toney RakesMartha L Brill was evaluated in Emergency Department on 02/17/2019 for the symptoms described in the history of present illness. She was evaluated in the context of the global COVID-19 pandemic, which necessitated consideration that the patient might be at risk for infection with the SARS-CoV-2 virus that causes COVID-19. Institutional protocols and algorithms that pertain to the evaluation of patients at risk for COVID-19 are in a state of rapid change based on information released by regulatory bodies including the CDC and federal and state organizations. These policies and algorithms were followed during the patient's care in the ED.  Some ED evaluations and interventions may be delayed as a result of limited staffing during the  pandemic.*      Medical Decision Making: 53 yo F here with seizure, R brow lac and wound to R ear. History of recurrent seizures, approx once weekly. No signs of traumatic fx or intracranial injury. She is back to her baseline. Her labs are reassuring. Wounds repaired and tetanus is UTD. Given her very complex seizure history, she would like to discuss w/ neurologist re: any med changes. Return precautions given.   ____________________________________________  FINAL CLINICAL IMPRESSION(S) / ED DIAGNOSES  Final diagnoses:  Seizure (HCC)  Laceration of forehead, initial encounter  Laceration of right ear lobe, initial encounter     MEDICATIONS GIVEN DURING THIS VISIT:  Medications - No data to display   ED Discharge Orders    None       Note:  This document was prepared using Dragon voice recognition software and may include unintentional dictation errors.   Shaune PollackIsaacs, Ozetta Flatley, MD 02/17/19 0120

## 2019-06-07 HISTORY — PX: PARS PLANA VITRECTOMY: SHX2166

## 2019-09-12 ENCOUNTER — Ambulatory Visit (INDEPENDENT_AMBULATORY_CARE_PROVIDER_SITE_OTHER): Payer: Medicare Other | Admitting: Obstetrics and Gynecology

## 2019-09-12 ENCOUNTER — Other Ambulatory Visit (HOSPITAL_COMMUNITY)
Admission: RE | Admit: 2019-09-12 | Discharge: 2019-09-12 | Disposition: A | Discharge status: Home / Self Care | Payer: Medicare Other | Source: Ambulatory Visit | Attending: Certified Nurse Midwife | Admitting: Certified Nurse Midwife

## 2019-09-12 ENCOUNTER — Encounter: Payer: Self-pay | Admitting: Obstetrics and Gynecology

## 2019-09-12 ENCOUNTER — Other Ambulatory Visit: Payer: Self-pay

## 2019-09-12 VITALS — BP 101/76 | HR 67 | Ht 63.0 in | Wt 166.3 lb

## 2019-09-12 DIAGNOSIS — N950 Postmenopausal bleeding: Secondary | ICD-10-CM

## 2019-09-12 NOTE — Addendum Note (Signed)
Addended by: Dorian Pod on: 09/12/2019 11:20 AM   Modules accepted: Orders

## 2019-09-12 NOTE — Progress Notes (Signed)
HPI:      Mandy Deleon is a 54 y.o. G0P0000 who LMP was No LMP recorded. (Menstrual status: Perimenopausal).  Subjective:   She presents today because she has had greater than 6 months of postmenopausal bleeding.  She says her bleeding stopped 2 years ago and she went for a year and a half without any bleeding and was diagnosed as being in menopause.  In October 2020 patient had a significant worsening of her epilepsy and was hospitalized for a medication change.  During this time she began having vaginal bleeding.  She has had bleeding off and on each month since that time.  She says sometimes it is a pink discharge and sometimes it is very heavy worse than a menstrual period. 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    Hx: The following portions of the patient's history were reviewed and updated as appropriate:             She  has a past medical history of Acid reflux, Diverticulitis, Seizures (HCC), and Ulcerative colitis (HCC). She does not have a problem list on file. She  has a past surgical history that includes Cholecystectomy; vagas nerve stimulator (2011); Appendectomy; and Tonsillectomy. Her family history is not on file. She  reports that she has quit smoking. She has never used smokeless tobacco. She reports that she does not drink alcohol or use drugs. She has a current medication list which includes the following prescription(s): acetaminophen, cetirizine, famotidine, levetiracetam, melatonin, celontin, oxcarbazepine, and zonisamide. She is allergic to adhesive [tape]; codeine; morphine and related; and iodine.       Review of Systems:  Review of Systems  Constitutional: Denied constitutional symptoms, night sweats, recent illness, fatigue, fever, insomnia and weight loss.  Eyes: Denied eye symptoms, eye pain, photophobia, vision change  and visual disturbance.  Ears/Nose/Throat/Neck: Denied ear, nose, throat or neck symptoms, hearing loss, nasal discharge, sinus congestion and sore throat.  Cardiovascular: Denied cardiovascular symptoms, arrhythmia, chest pain/pressure, edema, exercise intolerance, orthopnea and palpitations.  Respiratory: Denied pulmonary symptoms, asthma, pleuritic pain, productive sputum, cough, dyspnea and wheezing.  Gastrointestinal: Denied, gastro-esophageal reflux, melena, nausea and vomiting.  Genitourinary: See HPI for additional information.  Musculoskeletal: Denied musculoskeletal symptoms, stiffness, swelling, muscle weakness and myalgia.  Dermatologic: Denied dermatology symptoms, rash and scar.  Neurologic: Denied neurology symptoms, dizziness, headache, neck pain and syncope.  Psychiatric: Denied psychiatric symptoms, anxiety and depression.  Endocrine: Denied endocrine symptoms including hot flashes and night sweats.   Meds:   Current Outpatient Medications on File Prior to Visit  Medication Sig Dispense Refill  . acetaminophen (TYLENOL) 500 MG tablet Take by mouth.    . cetirizine (ZYRTEC) 10 MG tablet Take by mouth.    . famotidine (PEPCID) 20 MG tablet Take by mouth.    . levETIRAcetam (KEPPRA) 750 MG tablet Take by mouth.    . Melatonin 5 MG CAPS Take by mouth.    . Methsuximide (CELONTIN) 300 MG CAPS Take by mouth.    . Oxcarbazepine (TRILEPTAL) 300 MG tablet Take by mouth.    . zonisamide (ZONEGRAN) 100 MG capsule Take by mouth.     No current facility-administered medications on file prior to visit.    Objective:     Vitals:   09/12/19 1017  BP: 101/76  Pulse: 67  Physical examination   Pelvic:   Vulva: Normal appearance.  No lesions.  Vagina: No lesions or abnormalities noted.  Moderate vaginal atrophy  Support: Normal pelvic support.  Urethra No masses tenderness or scarring.  Meatus Normal size without lesions or prolapse.  Cervix: Normal appearance.   No lesions.  Cervical stenosis  Anus: Normal exam.  No lesions.  Perineum: Normal exam.  No lesions.   Endometrial Biopsy After discussion with the patient regarding her abnormal uterine bleeding I recommended that she proceed with an endometrial biopsy for further diagnosis. The risks, benefits, alternatives, and indications for an endometrial biopsy were discussed with the patient in detail. She understood the risks including infection, bleeding, cervical laceration and uterine perforation.  Verbal consent was obtained.   PROCEDURE NOTE:  Vacurette endometrial biopsy was performed using aseptic technique with iodine preparation.  The uterus was sounded to a length of 7 cm.  Adequate sampling was obtained with minimal blood loss.  Despite good biopsy scant tissue was obtained.  The patient tolerated the procedure well.  Disposition will be pending pathology    Assessment:    G0P0000 There are no problems to display for this patient.    1. Postmenopausal bleeding     Patient has significant cervical stenosis but no endocervical polyps.  She has vaginal atrophy but it does not appear to be the source of her bleeding.   Plan:            1.  Endometrial biopsy performed when results return we will contact the patient and discuss future management based on those results.  Orders No orders of the defined types were placed in this encounter.   No orders of the defined types were placed in this encounter.     F/U  Return for We will contact her with any abnormal test results. I spent 33 minutes involved in the care of this patient preparing to see the patient by obtaining and reviewing her medical history (including labs, imaging tests and prior procedures), documenting clinical information in the electronic health record (EHR), counseling and coordinating care plans, writing and sending prescriptions, ordering tests or procedures and directly communicating with the patient by discussing  pertinent items from her history and physical exam as well as detailing my assessment and plan as noted above so that she has an informed understanding.  All of her questions were answered.  Finis Bud, M.D. 09/12/2019 11:10 AM

## 2019-09-13 LAB — SURGICAL PATHOLOGY

## 2019-09-17 ENCOUNTER — Encounter: Payer: Self-pay | Admitting: Obstetrics and Gynecology

## 2019-09-20 ENCOUNTER — Other Ambulatory Visit: Payer: Self-pay

## 2019-09-20 ENCOUNTER — Ambulatory Visit (INDEPENDENT_AMBULATORY_CARE_PROVIDER_SITE_OTHER): Payer: Medicare Other | Admitting: Obstetrics and Gynecology

## 2019-09-20 ENCOUNTER — Encounter: Payer: Self-pay | Admitting: Obstetrics and Gynecology

## 2019-09-20 VITALS — BP 108/74 | HR 78 | Ht 63.0 in | Wt 169.3 lb

## 2019-09-20 DIAGNOSIS — N840 Polyp of corpus uteri: Secondary | ICD-10-CM | POA: Diagnosis not present

## 2019-09-20 DIAGNOSIS — N950 Postmenopausal bleeding: Secondary | ICD-10-CM

## 2019-09-20 DIAGNOSIS — N939 Abnormal uterine and vaginal bleeding, unspecified: Secondary | ICD-10-CM

## 2019-09-20 NOTE — Progress Notes (Signed)
HPI:      Ms. KATALEAH BEJAR is a 54 y.o. G0P0000 who LMP was No LMP recorded. (Menstrual status: Perimenopausal).  Subjective:   She presents today for follow-up of postmenopausal bleeding.  She underwent endometrial biopsy.    Hx: The following portions of the patient's history were reviewed and updated as appropriate:             She  has a past medical history of Acid reflux, Diverticulitis, Seizures (HCC), and Ulcerative colitis (HCC). She does not have a problem list on file. She  has a past surgical history that includes Cholecystectomy; vagas nerve stimulator (2011); Appendectomy; and Tonsillectomy. Her family history is not on file. She  reports that she has quit smoking. She has never used smokeless tobacco. She reports that she does not drink alcohol or use drugs. She has a current medication list which includes the following prescription(s): acetaminophen, cetirizine, famotidine, levetiracetam, melatonin, celontin, oxcarbazepine, and zonisamide. She is allergic to adhesive [tape]; codeine; morphine and related; and iodine.       Review of Systems:  Review of Systems  Constitutional: Denied constitutional symptoms, night sweats, recent illness, fatigue, fever, insomnia and weight loss.  Eyes: Denied eye symptoms, eye pain, photophobia, vision change and visual disturbance.  Ears/Nose/Throat/Neck: Denied ear, nose, throat or neck symptoms, hearing loss, nasal discharge, sinus congestion and sore throat.  Cardiovascular: Denied cardiovascular symptoms, arrhythmia, chest pain/pressure, edema, exercise intolerance, orthopnea and palpitations.  Respiratory: Denied pulmonary symptoms, asthma, pleuritic pain, productive sputum, cough, dyspnea and wheezing.  Gastrointestinal: Denied, gastro-esophageal reflux, melena, nausea and vomiting.  Genitourinary: Denied genitourinary symptoms including symptomatic vaginal discharge, pelvic relaxation issues, and urinary complaints.   Musculoskeletal: Denied musculoskeletal symptoms, stiffness, swelling, muscle weakness and myalgia.  Dermatologic: Denied dermatology symptoms, rash and scar.  Neurologic: Denied neurology symptoms, dizziness, headache, neck pain and syncope.  Psychiatric: Denied psychiatric symptoms, anxiety and depression.  Endocrine: Denied endocrine symptoms including hot flashes and night sweats.   Meds:   Current Outpatient Medications on File Prior to Visit  Medication Sig Dispense Refill  . acetaminophen (TYLENOL) 500 MG tablet Take by mouth.    . cetirizine (ZYRTEC) 10 MG tablet Take by mouth.    . famotidine (PEPCID) 20 MG tablet Take by mouth.    . levETIRAcetam (KEPPRA) 750 MG tablet Take by mouth.    . Melatonin 5 MG CAPS Take by mouth.    . Methsuximide (CELONTIN) 300 MG CAPS Take by mouth.    . Oxcarbazepine (TRILEPTAL) 300 MG tablet Take by mouth.    . zonisamide (ZONEGRAN) 100 MG capsule Take by mouth.     No current facility-administered medications on file prior to visit.    Objective:     Vitals:   09/20/19 1131  BP: 108/74  Pulse: 78              Endometrial biopsy results reveal endometrial polyp  Assessment:    G0P0000 There are no problems to display for this patient.    1. Postmenopausal bleeding   2. Abnormal uterine bleeding due to endometrial polyp        Plan:            1.  Because the patient is menopausal recommend D&C/hysteroscopy/polypectomy.  Patient to schedule preop exam at her convenience.  2.  Anesthesia to be made aware of patient's history of epilepsy.   Orders No orders of the defined types were placed in this encounter.   No orders of  the defined types were placed in this encounter.     F/U  Return in about 4 weeks (around 10/18/2019) for Patient to schedule preop at her convenience within the next 4 weeks. I spent 13 minutes involved in the care of this patient preparing to see the patient by obtaining and reviewing her medical  history (including labs, imaging tests and prior procedures), documenting clinical information in the electronic health record (EHR), counseling and coordinating care plans, writing and sending prescriptions, ordering tests or procedures and directly communicating with the patient by discussing pertinent items from her history and physical exam as well as detailing my assessment and plan as noted above so that she has an informed understanding.  All of her questions were answered.  Finis Bud, M.D. 09/20/2019 11:41 AM

## 2019-10-07 ENCOUNTER — Encounter: Payer: Self-pay | Admitting: Certified Nurse Midwife

## 2019-10-18 ENCOUNTER — Encounter: Payer: Self-pay | Admitting: Obstetrics and Gynecology

## 2019-10-18 ENCOUNTER — Ambulatory Visit (INDEPENDENT_AMBULATORY_CARE_PROVIDER_SITE_OTHER): Payer: Medicare Other | Admitting: Obstetrics and Gynecology

## 2019-10-18 ENCOUNTER — Other Ambulatory Visit: Payer: Self-pay

## 2019-10-18 VITALS — BP 108/71 | HR 73 | Ht 63.0 in | Wt 171.3 lb

## 2019-10-18 DIAGNOSIS — N950 Postmenopausal bleeding: Secondary | ICD-10-CM

## 2019-10-18 DIAGNOSIS — N939 Abnormal uterine and vaginal bleeding, unspecified: Secondary | ICD-10-CM | POA: Diagnosis not present

## 2019-10-18 DIAGNOSIS — N840 Polyp of corpus uteri: Secondary | ICD-10-CM | POA: Diagnosis not present

## 2019-10-18 NOTE — Progress Notes (Signed)
PRE-OPERATIVE HISTORY AND PHYSICAL EXAM  PCP:  Zolotor, Coralee North, MD Subjective:   HPI:  Mandy Deleon is a 54 y.o. G0P0000.  No LMP recorded. (Menstrual status: Perimenopausal).  She presents today for a pre-op discussion and PE.  She has the following symptoms: Patient was found to have postmenopausal bleeding and endometrial biopsy revealed endometrial polyp.  Patient is not currently having further bleeding.  Review of Systems:   Constitutional: Denied constitutional symptoms, night sweats, recent illness, fatigue, fever, insomnia and weight loss.  Eyes: Denied eye symptoms, eye pain, photophobia, vision change and visual disturbance.  Ears/Nose/Throat/Neck: Denied ear, nose, throat or neck symptoms, hearing loss, nasal discharge, sinus congestion and sore throat.  Cardiovascular: Denied cardiovascular symptoms, arrhythmia, chest pain/pressure, edema, exercise intolerance, orthopnea and palpitations.  Respiratory: Denied pulmonary symptoms, asthma, pleuritic pain, productive sputum, cough, dyspnea and wheezing.  Gastrointestinal: Denied, gastro-esophageal reflux, melena, nausea and vomiting.  Genitourinary: Denied genitourinary symptoms including symptomatic vaginal discharge, pelvic relaxation issues, and urinary complaints.  Musculoskeletal: Denied musculoskeletal symptoms, stiffness, swelling, muscle weakness and myalgia.  Dermatologic: Denied dermatology symptoms, rash and scar.  Neurologic: Denied neurology symptoms, dizziness, headache, neck pain and syncope.  Psychiatric: Denied psychiatric symptoms, anxiety and depression.  Endocrine: Denied endocrine symptoms including hot flashes and night sweats.   OB History  Gravida Para Term Preterm AB Living  0 0 0 0 0 0  SAB TAB Ectopic Multiple Live Births  0 0 0 0      Past Medical History:  Diagnosis Date  . Acid reflux   . Diverticulitis   . Seizures (HCC)   . Ulcerative colitis [facility])     Past Surgical History:    Procedure Laterality Date  . APPENDECTOMY    . CHOLECYSTECTOMY    . TONSILLECTOMY    . vagas nerve stimulator  2011      SOCIAL HISTORY:  Social History   Tobacco Use  Smoking Status Former Smoker  Smokeless Tobacco Never Used   Social History   Substance and Sexual Activity  Alcohol Use No    Social History   Substance and Sexual Activity  Drug Use No    History reviewed. No pertinent family history.  ALLERGIES:  Adhesive [tape], Codeine, Morphine and related, and Iodine  MEDS:   Current Outpatient Medications on File Prior to Visit  Medication Sig Dispense Refill  . acetaminophen (TYLENOL) 500 MG tablet Take by mouth.    . cetirizine (ZYRTEC) 10 MG tablet Take by mouth.    . levETIRAcetam (KEPPRA) 750 MG tablet Take by mouth.    . Melatonin 5 MG CAPS Take by mouth.    . Methsuximide (CELONTIN) 300 MG CAPS Take by mouth.    . Oxcarbazepine (TRILEPTAL) 300 MG tablet Take by mouth.    . zonisamide (ZONEGRAN) 100 MG capsule Take by mouth.    . famotidine (PEPCID) 20 MG tablet Take by mouth.     No current facility-administered medications on file prior to visit.    No orders of the defined types were placed in this encounter.    Physical examination BP 108/71   Pulse 73   Ht 5\' 3"  (1.6 m)   Wt 171 lb 4.8 oz (77.7 kg)   BMI 30.34 kg/m   General NAD, Conversant  HEENT Atraumatic; Op clear with mmm.  Normo-cephalic. Pupils reactive. Anicteric sclerae  Thyroid/Neck Smooth without nodularity or enlargement. Normal ROM.  Neck Supple.  Skin No rashes, lesions or ulceration.  Normal palpated skin turgor. No nodularity.  Breasts: No masses or discharge.  Symmetric.  No axillary adenopathy.  Lungs: Clear to auscultation.No rales or wheezes. Normal Respiratory effort, no retractions.  Heart: NSR.  No murmurs or rubs appreciated. No periferal edema  Abdomen: Soft.  Non-tender.  No masses.  No HSM. No hernia  Extremities: Moves all appropriately.  Normal ROM for  age. No lymphadenopathy.  Neuro: Oriented to PPT.  Normal mood. Normal affect.   Pelvic: See previous examination with endometrial biopsy.  Pelvic exam deferred to OR immediately prior to [facility] hysteroscopy  Assessment:   G0P0000 There are no problems to display for this patient.   1. Postmenopausal bleeding   2. Abnormal uterine bleeding due to endometrial polyp      Plan:   Orders: No orders of the defined types were placed in this encounter.    1.  D&C hysteroscopy for postmenopausal bleeding endometrial polyp. D&C The procedure and the risks and benefits of dilation and curettage/evacuation have been explained to the patient.  The specific risks of bleeding, infection, anesthesia, uterine perforation, and damage to bowel or bladder  have been specifically discussed.  I have answered all of her questions and I believe that she has an adequate and informed understanding of this procedure.  Covid testing discussed-NPO before surgery discussed.  I spent 32 minutes involved in the care of this patient preparing to see the patient by obtaining and reviewing her medical history (including labs, imaging tests and prior procedures), documenting clinical information in the electronic health record (EHR), counseling and coordinating care plans, writing and sending prescriptions, ordering tests or procedures and directly communicating with the patient by discussing pertinent items from her history and physical exam as well as detailing my assessment and plan as noted above so that she has an informed understanding.  All of her questions were answered.    Elonda Husky, M.D. 10/18/2019 12:10 PM

## 2019-10-18 NOTE — H&P (View-Only) (Signed)
PRE-OPERATIVE HISTORY AND PHYSICAL EXAM  PCP:  Zolotor, Coralee North, MD Subjective:   HPI:  Mandy Deleon is a 54 y.o. G0P0000.  No LMP recorded. (Menstrual status: Perimenopausal).  She presents today for a pre-op discussion and PE.  She has the following symptoms: Patient was found to have postmenopausal bleeding and endometrial biopsy revealed endometrial polyp.  Patient is not currently having further bleeding.  Review of Systems:   Constitutional: Denied constitutional symptoms, night sweats, recent illness, fatigue, fever, insomnia and weight loss.  Eyes: Denied eye symptoms, eye pain, photophobia, vision change and visual disturbance.  Ears/Nose/Throat/Neck: Denied ear, nose, throat or neck symptoms, hearing loss, nasal discharge, sinus congestion and sore throat.  Cardiovascular: Denied cardiovascular symptoms, arrhythmia, chest pain/pressure, edema, exercise intolerance, orthopnea and palpitations.  Respiratory: Denied pulmonary symptoms, asthma, pleuritic pain, productive sputum, cough, dyspnea and wheezing.  Gastrointestinal: Denied, gastro-esophageal reflux, melena, nausea and vomiting.  Genitourinary: Denied genitourinary symptoms including symptomatic vaginal discharge, pelvic relaxation issues, and urinary complaints.  Musculoskeletal: Denied musculoskeletal symptoms, stiffness, swelling, muscle weakness and myalgia.  Dermatologic: Denied dermatology symptoms, rash and scar.  Neurologic: Denied neurology symptoms, dizziness, headache, neck pain and syncope.  Psychiatric: Denied psychiatric symptoms, anxiety and depression.  Endocrine: Denied endocrine symptoms including hot flashes and night sweats.   OB History  Gravida Para Term Preterm AB Living  0 0 0 0 0 0  SAB TAB Ectopic Multiple Live Births  0 0 0 0      Past Medical History:  Diagnosis Date  . Acid reflux   . Diverticulitis   . Seizures (HCC)   . Ulcerative colitis [facility])     Past Surgical History:    Procedure Laterality Date  . APPENDECTOMY    . CHOLECYSTECTOMY    . TONSILLECTOMY    . vagas nerve stimulator  2011      SOCIAL HISTORY:  Social History   Tobacco Use  Smoking Status Former Smoker  Smokeless Tobacco Never Used   Social History   Substance and Sexual Activity  Alcohol Use No    Social History   Substance and Sexual Activity  Drug Use No    History reviewed. No pertinent family history.  ALLERGIES:  Adhesive [tape], Codeine, Morphine and related, and Iodine  MEDS:   Current Outpatient Medications on File Prior to Visit  Medication Sig Dispense Refill  . acetaminophen (TYLENOL) 500 MG tablet Take by mouth.    . cetirizine (ZYRTEC) 10 MG tablet Take by mouth.    . levETIRAcetam (KEPPRA) 750 MG tablet Take by mouth.    . Melatonin 5 MG CAPS Take by mouth.    . Methsuximide (CELONTIN) 300 MG CAPS Take by mouth.    . Oxcarbazepine (TRILEPTAL) 300 MG tablet Take by mouth.    . zonisamide (ZONEGRAN) 100 MG capsule Take by mouth.    . famotidine (PEPCID) 20 MG tablet Take by mouth.     No current facility-administered medications on file prior to visit.    No orders of the defined types were placed in this encounter.    Physical examination BP 108/71   Pulse 73   Ht 5\' 3"  (1.6 m)   Wt 171 lb 4.8 oz (77.7 kg)   BMI 30.34 kg/m   General NAD, Conversant  HEENT Atraumatic; Op clear with mmm.  Normo-cephalic. Pupils reactive. Anicteric sclerae  Thyroid/Neck Smooth without nodularity or enlargement. Normal ROM.  Neck Supple.  Skin No rashes, lesions or ulceration.  Normal palpated skin turgor. No nodularity.  Breasts: No masses or discharge.  Symmetric.  No axillary adenopathy.  Lungs: Clear to auscultation.No rales or wheezes. Normal Respiratory effort, no retractions.  Heart: NSR.  No murmurs or rubs appreciated. No periferal edema  Abdomen: Soft.  Non-tender.  No masses.  No HSM. No hernia  Extremities: Moves all appropriately.  Normal ROM for  age. No lymphadenopathy.  Neuro: Oriented to PPT.  Normal mood. Normal affect.   Pelvic: See previous examination with endometrial biopsy.  Pelvic exam deferred to OR immediately prior to [facility] hysteroscopy  Assessment:   G0P0000 There are no problems to display for this patient.   1. Postmenopausal bleeding   2. Abnormal uterine bleeding due to endometrial polyp      Plan:   Orders: No orders of the defined types were placed in this encounter.    1.  D&C hysteroscopy for postmenopausal bleeding endometrial polyp. D&C The procedure and the risks and benefits of dilation and curettage/evacuation have been explained to the patient.  The specific risks of bleeding, infection, anesthesia, uterine perforation, and damage to bowel or bladder  have been specifically discussed.  I have answered all of her questions and I believe that she has an adequate and informed understanding of this procedure.  Covid testing discussed-NPO before surgery discussed.

## 2019-10-18 NOTE — Progress Notes (Signed)
Patient comes in today for pre op appointment.  

## 2019-10-18 NOTE — H&P (Signed)
PRE-OPERATIVE HISTORY AND PHYSICAL EXAM  PCP:  Zolotor, Coralee North, MD Subjective:   HPI:  Mandy Deleon is a 54 y.o. G0P0000.  No LMP recorded. (Menstrual status: Perimenopausal).  She presents today for a pre-op discussion and PE.  She has the following symptoms: Patient was found to have postmenopausal bleeding and endometrial biopsy revealed endometrial polyp.  Patient is not currently having further bleeding.  Review of Systems:   Constitutional: Denied constitutional symptoms, night sweats, recent illness, fatigue, fever, insomnia and weight loss.  Eyes: Denied eye symptoms, eye pain, photophobia, vision change and visual disturbance.  Ears/Nose/Throat/Neck: Denied ear, nose, throat or neck symptoms, hearing loss, nasal discharge, sinus congestion and sore throat.  Cardiovascular: Denied cardiovascular symptoms, arrhythmia, chest pain/pressure, edema, exercise intolerance, orthopnea and palpitations.  Respiratory: Denied pulmonary symptoms, asthma, pleuritic pain, productive sputum, cough, dyspnea and wheezing.  Gastrointestinal: Denied, gastro-esophageal reflux, melena, nausea and vomiting.  Genitourinary: Denied genitourinary symptoms including symptomatic vaginal discharge, pelvic relaxation issues, and urinary complaints.  Musculoskeletal: Denied musculoskeletal symptoms, stiffness, swelling, muscle weakness and myalgia.  Dermatologic: Denied dermatology symptoms, rash and scar.  Neurologic: Denied neurology symptoms, dizziness, headache, neck pain and syncope.  Psychiatric: Denied psychiatric symptoms, anxiety and depression.  Endocrine: Denied endocrine symptoms including hot flashes and night sweats.   OB History  Gravida Para Term Preterm AB Living  0 0 0 0 0 0  SAB TAB Ectopic Multiple Live Births  0 0 0 0      Past Medical History:  Diagnosis Date  . Acid reflux   . Diverticulitis   . Seizures (HCC)   . Ulcerative colitis [facility])     Past Surgical History:    Procedure Laterality Date  . APPENDECTOMY    . CHOLECYSTECTOMY    . TONSILLECTOMY    . vagas nerve stimulator  2011      SOCIAL HISTORY:  Social History   Tobacco Use  Smoking Status Former Smoker  Smokeless Tobacco Never Used   Social History   Substance and Sexual Activity  Alcohol Use No    Social History   Substance and Sexual Activity  Drug Use No    History reviewed. No pertinent family history.  ALLERGIES:  Adhesive [tape], Codeine, Morphine and related, and Iodine  MEDS:   Current Outpatient Medications on File Prior to Visit  Medication Sig Dispense Refill  . acetaminophen (TYLENOL) 500 MG tablet Take by mouth.    . cetirizine (ZYRTEC) 10 MG tablet Take by mouth.    . levETIRAcetam (KEPPRA) 750 MG tablet Take by mouth.    . Melatonin 5 MG CAPS Take by mouth.    . Methsuximide (CELONTIN) 300 MG CAPS Take by mouth.    . Oxcarbazepine (TRILEPTAL) 300 MG tablet Take by mouth.    . zonisamide (ZONEGRAN) 100 MG capsule Take by mouth.    . famotidine (PEPCID) 20 MG tablet Take by mouth.     No current facility-administered medications on file prior to visit.    No orders of the defined types were placed in this encounter.    Physical examination BP 108/71   Pulse 73   Ht 5\' 3"  (1.6 m)   Wt 171 lb 4.8 oz (77.7 kg)   BMI 30.34 kg/m   General NAD, Conversant  HEENT Atraumatic; Op clear with mmm.  Normo-cephalic. Pupils reactive. Anicteric sclerae  Thyroid/Neck Smooth without nodularity or enlargement. Normal ROM.  Neck Supple.  Skin No rashes, lesions or ulceration.  Normal palpated skin turgor. No nodularity.  Breasts: No masses or discharge.  Symmetric.  No axillary adenopathy.  Lungs: Clear to auscultation.No rales or wheezes. Normal Respiratory effort, no retractions.  Heart: NSR.  No murmurs or rubs appreciated. No periferal edema  Abdomen: Soft.  Non-tender.  No masses.  No HSM. No hernia  Extremities: Moves all appropriately.  Normal ROM for  age. No lymphadenopathy.  Neuro: Oriented to PPT.  Normal mood. Normal affect.   Pelvic: See previous examination with endometrial biopsy.  Pelvic exam deferred to OR immediately prior to D&C hysteroscopy  Assessment:   G0P0000 There are no problems to display for this patient.   1. Postmenopausal bleeding   2. Abnormal uterine bleeding due to endometrial polyp      Plan:   Orders: No orders of the defined types were placed in this encounter.    1.  D&C hysteroscopy for postmenopausal bleeding endometrial polyp. D&C The procedure and the risks and benefits of dilation and curettage/evacuation have been explained to the patient.  The specific risks of bleeding, infection, anesthesia, uterine perforation, and damage to bowel or bladder  have been specifically discussed.  I have answered all of her questions and I believe that she has an adequate and informed understanding of this procedure.  Covid testing discussed-NPO before surgery discussed.      

## 2019-10-30 ENCOUNTER — Other Ambulatory Visit: Payer: Medicare Other

## 2019-11-05 ENCOUNTER — Other Ambulatory Visit: Payer: Self-pay

## 2019-11-05 ENCOUNTER — Encounter
Admission: RE | Admit: 2019-11-05 | Discharge: 2019-11-05 | Disposition: A | Discharge status: Home / Self Care | Payer: Medicare Other | Source: Ambulatory Visit | Attending: Obstetrics and Gynecology | Admitting: Obstetrics and Gynecology

## 2019-11-05 HISTORY — DX: Adjustment disorder with depressed mood: F43.21

## 2019-11-05 HISTORY — DX: Repeated falls: R29.6

## 2019-11-05 HISTORY — DX: Epilepsy, unspecified, intractable, without status epilepticus: G40.919

## 2019-11-05 HISTORY — DX: Unsteadiness on feet: R26.81

## 2019-11-05 HISTORY — DX: Depression, unspecified: F32.A

## 2019-11-05 NOTE — Pre-Procedure Instructions (Signed)
ECG 12 Lead11/01/2019 [facility] Component Name Value Ref Range  EKG Systolic BP  mmHg  EKG Diastolic BP  mmHg  EKG Ventricular Rate 71 BPM  EKG Atrial Rate 71 BPM  EKG P-R Interval 136 ms  EKG QRS Duration 80 ms  EKG Q-T Interval 398 ms  EKG QTC Calculation 432 ms  EKG Calculated P Axis 39 degrees  EKG Calculated R Axis 22 degrees  EKG Calculated T Axis 50 degrees  QTC Fredericia 421 ms  Result Narrative  NORMAL SINUS RHYTHM NORMAL ECG WHEN COMPARED WITH ECG OF 05-Nov-2015 22:42, NO SIGNIFICANT CHANGE WAS FOUND Confirmed by Schuyler Amor ([id number]) on 04/14/2019 10:36:04 AM  Other Result Information  Interface, Rad Results In - 04/14/2019 10:36 AM EST NORMAL SINUS RHYTHM NORMAL ECG WHEN COMPARED WITH ECG OF 05-Nov-2015 22:42, NO SIGNIFICANT CHANGE WAS FOUND Confirmed by Schuyler Amor ([id number]) on 04/14/2019 10:36:04 AM

## 2019-11-05 NOTE — Pre-Procedure Instructions (Signed)
Contacted Dr. Noralyn Pick to inform him of the needs of this patient in regards to her seizures and the VNS and RNS.  Explained that her neurologists from [facility] are very involved in her care and are aware of her upcoming surgery.  I will

## 2019-11-05 NOTE — Patient Instructions (Addendum)
INSTRUCTIONS FOR SURGERY     Your surgery is scheduled for:   Friday, June 4TH     To find out your arrival time for the day of surgery,          please call [phone] between 1 pm and 3 pm on : Thursday, June 3RD     When you arrive for surgery, report to the [facility].       Do NOT stop on the first floor to register.    REMEMBER: Instructions that are not followed completely may result in serious medical risk,  up to and including death, or upon the discretion of your surgeon and anesthesiologist,            your surgery may need to be rescheduled.  __X__ 1. Do not eat food after midnight the night before your procedure.                    No gum, candy, lozenger, tic tacs, tums or hard candies.                  ABSOLUTELY NOTHING SOLID IN YOUR MOUTH AFTER MIDNIGHT                    You may drink unlimited clear liquids up to 2 hours before you are scheduled to arrive for surgery.                   Do not drink anything within those 2 hours unless you need to take medicine, then take the                   smallest amount you need.  Clear liquids include:  water, apple juice without pulp,                   any flavor Gatorade, Black coffee, black tea.  Sugar may be added but no dairy/ honey /lemon.                        Broth and jello is not considered a clear liquid.  __x__  2. On the morning of surgery, please brush your teeth with toothpaste and water. You may rinse with                  mouthwash if you wish but DO NOT SWALLOW TOOTHPASTE OR MOUTHWASH  __X___3. NO alcohol for 24 hours before or after surgery.  __x___ 4.  Do NOT smoke or use e-cigarettes for 24 HOURS PRIOR TO SURGERY.                      DO NOT Use any chewable tobacco products for at least 6 hours prior to surgery.  __x___ 5. If you start any new medication after this appointment and prior to surgery, please  Bring it with you on the day of surgery.  ___x__ 6. Notify your doctor if there is any change in your medical condition, such as fever, infection, vomitting,  Diarrhea or any open sores.  __x___ 7.  USE antibacterial SOAP as instructed, the night before surgery and the day of surgery.                   Once you have washed with this soap, do NOT use any of the following: Powders, perfumes                    or lotions. Please do not wear make up, hairpins, clips or nail polish. You may wear deodorant.                   Men may shave their face and neck.  Women need to shave 48 hours prior to surgery.                   DO NOT wear ANY jewelry on the day of surgery. If there are rings that are too tight to                    remove easily, please address this prior to the surgery day. Piercings need to be removed.                                                                     NO METAL ON YOUR BODY.                    Do NOT bring any valuables.  If you came to Pre-Admit testing then you will not need license,                     insurance card or credit card.  If you will be staying overnight, please either leave your things in                     the car or have your family be responsible for these items.                     [facility] IS NOT RESPONSIBLE FOR BELONGINGS OR VALUABLES.  ___X__ 8. DO NOT wear contact lenses on surgery day.  You may not have dentures,                     Hearing aides, contacts or glasses in the operating room. These items can be                    Placed in the Recovery Room to receive immediately after surgery.  __x___ 9. IF YOU ARE SCHEDULED TO GO HOME ON THE SAME DAY, YOU MUST                   Have someone to drive you home and to stay with you  for the first 24 hours.                    Have an arrangement prior to arriving on surgery day.  ___x__ 10. Take the following medications on the morning of surgery with a sip of water:  1. PEPCID                     2. KEPPRA                     3. ZONEGRAN                     4. CELONTIN                               __X___ 11.  Follow any instructions provided to you by your surgeon.                        Such as enema, clear liquid bowel prep                      ##PLEASE CONSUME CARBOHYDRATE SURGICAL DRINK ON THE MORNING                           OF SURGERY.  HAVE IT COMPLETED 2 HOURS PRIOR TO ARRIVAL TO                           THE HOSPITAL.##  __X__  12. STOP ALL ASPIRIN PRODUCTS IMMEDIATELY.   (11/05/19) __x___ 13. STOP Anti-inflammatories IMMEDIATELY   (11/05/19)                      This includes IBUPROFEN / MOTRIN / ADVIL / ALEVE/ NAPROXYN                    YOU MAY TAKE TYLENOL ANY TIME PRIOR TO SURGERY.  ______18. If staying overnight, please have appropriate shoes to wear to be able to walk around the unit.                   Wear clean and comfortable clothing to the hospital.  HAVE LOOSE CLOTHING AROUND YOUR BELLY. BRING PHONE NUMBERS FOR CONTACTS.   CONTINUE TAKING ALL EVENING MEDICATIONS AS USUAL.

## 2019-11-06 ENCOUNTER — Other Ambulatory Visit
Admission: RE | Admit: 2019-11-06 | Discharge: 2019-11-06 | Disposition: A | Discharge status: Home / Self Care | Payer: Medicare Other | Source: Ambulatory Visit | Attending: Obstetrics and Gynecology | Admitting: Obstetrics and Gynecology

## 2019-11-06 DIAGNOSIS — Z20822 Contact with and (suspected) exposure to covid-19: Secondary | ICD-10-CM | POA: Diagnosis not present

## 2019-11-06 DIAGNOSIS — Z01812 Encounter for preprocedural laboratory examination: Secondary | ICD-10-CM | POA: Insufficient documentation

## 2019-11-06 LAB — CBC
HCT: 41.4 % (ref 36.0–46.0)
Hemoglobin: 14 g/dL (ref 12.0–15.0)
MCH: 33.1 pg (ref 26.0–34.0)
MCHC: 33.8 g/dL (ref 30.0–36.0)
MCV: 97.9 fL (ref 80.0–100.0)
Platelets: 263 10*3/uL (ref 150–400)
RBC: 4.23 MIL/uL (ref 3.87–5.11)
RDW: 13.1 % (ref 11.5–15.5)
WBC: 5.8 10*3/uL (ref 4.0–10.5)
nRBC: 0 % (ref 0.0–0.2)

## 2019-11-06 LAB — TYPE AND SCREEN
ABO/RH(D): A POS
Antibody Screen: NEGATIVE

## 2019-11-06 LAB — SARS CORONAVIRUS 2 (TAT 6-24 HRS): SARS Coronavirus 2: NEGATIVE

## 2019-11-07 ENCOUNTER — Telehealth: Payer: Self-pay | Admitting: Obstetrics and Gynecology

## 2019-11-07 NOTE — Telephone Encounter (Signed)
Pt called back in and stated that she hasnt heard anything. I called back to JW. JW stated that she is is aware and that Dr. Logan Bores has to check the staff. Jw stated she will call the pt when she finds out. I told the pt what JW stated. The pt verbally understood that someone will give them a call.

## 2019-11-07 NOTE — Telephone Encounter (Signed)
Pt called she has surgery tomorrow morning. The pt is requesting that Dr. Logan Bores keeps her in the hospital.  The pt stated she has no one to watch or take care of her after the surgery. I informed the pt that a nurse would be in touch. Please advise

## 2019-11-07 NOTE — Telephone Encounter (Signed)
Spoke with patient and her brother is going to take her home from the procedure. Dr. Logan Bores is aware that procedure is still on.

## 2019-11-08 ENCOUNTER — Other Ambulatory Visit: Payer: Self-pay

## 2019-11-08 ENCOUNTER — Encounter
Admission: RE | Disposition: A | Discharge status: Home / Self Care | Payer: Self-pay | Source: Home / Self Care | Attending: Obstetrics and Gynecology

## 2019-11-08 ENCOUNTER — Ambulatory Visit: Payer: Medicare Other | Admitting: Anesthesiology

## 2019-11-08 ENCOUNTER — Telehealth: Payer: Self-pay | Admitting: Obstetrics and Gynecology

## 2019-11-08 ENCOUNTER — Encounter: Payer: Self-pay | Admitting: Obstetrics and Gynecology

## 2019-11-08 ENCOUNTER — Ambulatory Visit
Admission: RE | Admit: 2019-11-08 | Discharge: 2019-11-08 | Disposition: A | Discharge status: Home / Self Care | Payer: Medicare Other | Attending: Obstetrics and Gynecology | Admitting: Obstetrics and Gynecology

## 2019-11-08 DIAGNOSIS — Z79899 Other long term (current) drug therapy: Secondary | ICD-10-CM | POA: Insufficient documentation

## 2019-11-08 DIAGNOSIS — E669 Obesity, unspecified: Secondary | ICD-10-CM | POA: Insufficient documentation

## 2019-11-08 DIAGNOSIS — F329 Major depressive disorder, single episode, unspecified: Secondary | ICD-10-CM | POA: Insufficient documentation

## 2019-11-08 DIAGNOSIS — N950 Postmenopausal bleeding: Secondary | ICD-10-CM | POA: Diagnosis present

## 2019-11-08 DIAGNOSIS — N840 Polyp of corpus uteri: Secondary | ICD-10-CM | POA: Insufficient documentation

## 2019-11-08 DIAGNOSIS — G40909 Epilepsy, unspecified, not intractable, without status epilepticus: Secondary | ICD-10-CM | POA: Insufficient documentation

## 2019-11-08 DIAGNOSIS — F4321 Adjustment disorder with depressed mood: Secondary | ICD-10-CM | POA: Insufficient documentation

## 2019-11-08 DIAGNOSIS — Z87891 Personal history of nicotine dependence: Secondary | ICD-10-CM | POA: Diagnosis not present

## 2019-11-08 DIAGNOSIS — K219 Gastro-esophageal reflux disease without esophagitis: Secondary | ICD-10-CM | POA: Insufficient documentation

## 2019-11-08 DIAGNOSIS — Z6829 Body mass index (BMI) 29.0-29.9, adult: Secondary | ICD-10-CM | POA: Insufficient documentation

## 2019-11-08 HISTORY — PX: HYSTEROSCOPY WITH D & C: SHX1775

## 2019-11-08 LAB — POCT PREGNANCY, URINE: Preg Test, Ur: NEGATIVE

## 2019-11-08 SURGERY — DILATATION AND CURETTAGE /HYSTEROSCOPY
Anesthesia: General | Laterality: Bilateral

## 2019-11-08 MED ORDER — ONDANSETRON HCL 4 MG/2ML IJ SOLN
INTRAMUSCULAR | Status: DC | PRN
  Administered 2019-11-08: 4 mg via INTRAVENOUS

## 2019-11-08 MED ORDER — FENTANYL CITRATE (PF) 100 MCG/2ML IJ SOLN
25.0000 ug | INTRAMUSCULAR | Status: DC | PRN
  Administered 2019-11-08: 25 ug via INTRAVENOUS

## 2019-11-08 MED ORDER — LACTATED RINGERS IV SOLN: INTRAVENOUS | Status: DC

## 2019-11-08 MED ORDER — ONDANSETRON HCL 4 MG/2ML IJ SOLN
INTRAMUSCULAR | Status: AC
  Filled 2019-11-08: qty 2

## 2019-11-08 MED ORDER — KETOROLAC TROMETHAMINE 30 MG/ML IJ SOLN
INTRAMUSCULAR | Status: AC
  Filled 2019-11-08: qty 1

## 2019-11-08 MED ORDER — LIDOCAINE HCL (CARDIAC) PF 100 MG/5ML IV SOSY
PREFILLED_SYRINGE | INTRAVENOUS | Status: DC | PRN
  Administered 2019-11-08: 60 mg via INTRAVENOUS

## 2019-11-08 MED ORDER — KETOROLAC TROMETHAMINE 30 MG/ML IJ SOLN
30.0000 mg | Freq: Once | INTRAMUSCULAR | Status: AC
  Administered 2019-11-08: 30 mg via INTRAVENOUS

## 2019-11-08 MED ORDER — DEXTROSE IN LACTATED RINGERS 5 % IV SOLN: INTRAVENOUS | Status: DC

## 2019-11-08 MED ORDER — CHLORHEXIDINE GLUCONATE 0.12 % MT SOLN
OROMUCOSAL | Status: AC
  Filled 2019-11-08: qty 15

## 2019-11-08 MED ORDER — FENTANYL CITRATE (PF) 100 MCG/2ML IJ SOLN
INTRAMUSCULAR | Status: DC | PRN
  Administered 2019-11-08: 25 ug via INTRAVENOUS
  Administered 2019-11-08: 50 ug via INTRAVENOUS
  Administered 2019-11-08: 25 ug via INTRAVENOUS

## 2019-11-08 MED ORDER — CHLORHEXIDINE GLUCONATE 0.12 % MT SOLN: 15.0000 mL | Freq: Once | OROMUCOSAL | Status: DC

## 2019-11-08 MED ORDER — ONDANSETRON HCL 4 MG/2ML IJ SOLN
4.0000 mg | Freq: Once | INTRAMUSCULAR | Status: AC | PRN
  Administered 2019-11-08: 4 mg via INTRAVENOUS

## 2019-11-08 MED ORDER — ORAL CARE MOUTH RINSE: 15.0000 mL | Freq: Once | OROMUCOSAL | Status: DC

## 2019-11-08 MED ORDER — ONDANSETRON HCL 4 MG/2ML IJ SOLN: 4.0000 mg | Freq: Four times a day (QID) | INTRAMUSCULAR | Status: DC | PRN

## 2019-11-08 MED ORDER — PROPOFOL 10 MG/ML IV BOLUS
INTRAVENOUS | Status: AC
  Filled 2019-11-08: qty 40

## 2019-11-08 MED ORDER — PROPOFOL 10 MG/ML IV BOLUS
INTRAVENOUS | Status: DC | PRN
  Administered 2019-11-08: 50 mg via INTRAVENOUS
  Administered 2019-11-08: 150 mg via INTRAVENOUS

## 2019-11-08 MED ORDER — FENTANYL CITRATE (PF) 100 MCG/2ML IJ SOLN
INTRAMUSCULAR | Status: AC
  Administered 2019-11-08: 25 ug via INTRAVENOUS
  Filled 2019-11-08: qty 2

## 2019-11-08 MED ORDER — FENTANYL CITRATE (PF) 100 MCG/2ML IJ SOLN
INTRAMUSCULAR | Status: AC
  Filled 2019-11-08: qty 2

## 2019-11-08 MED ORDER — ONDANSETRON 4 MG PO TBDP: 4.0000 mg | ORAL_TABLET | Freq: Four times a day (QID) | ORAL | Status: DC | PRN

## 2019-11-08 SURGICAL SUPPLY — 19 items
COVER WAND RF STERILE (DRAPES) IMPLANT
DRSG TELFA 3X8 NADH (GAUZE/BANDAGES/DRESSINGS) ×3 IMPLANT
GAUZE 4X4 16PLY RFD (DISPOSABLE) ×3 IMPLANT
GLOVE BIOGEL PI ORTHO PRO 7.5 (GLOVE) ×2
GLOVE PI ORTHO PRO STRL 7.5 (GLOVE) ×1 IMPLANT
GOWN STRL REUS W/ TWL LRG LVL3 (GOWN DISPOSABLE) ×1 IMPLANT
GOWN STRL REUS W/TWL LRG LVL3 (GOWN DISPOSABLE) ×3
KIT PROCEDURE FLUENT (KITS) ×3 IMPLANT
KIT TURNOVER CYSTO (KITS) ×3 IMPLANT
NS IRRIG 500ML POUR BTL (IV SOLUTION) ×3 IMPLANT
PACK DNC HYST (MISCELLANEOUS) ×3 IMPLANT
PAD DRESSING TELFA 3X8 NADH (GAUZE/BANDAGES/DRESSINGS) ×1 IMPLANT
PAD OB MATERNITY 4.3X12.25 (PERSONAL CARE ITEMS) ×3 IMPLANT
PAD PREP 24X41 OB/GYN DISP (PERSONAL CARE ITEMS) ×3 IMPLANT
SEAL ROD LENS SCOPE MYOSURE (ABLATOR) ×3 IMPLANT
SOL .9 NS 3000ML IRR  AL (IV SOLUTION) ×3
SOL .9 NS 3000ML IRR AL (IV SOLUTION) ×1
SOL .9 NS 3000ML IRR UROMATIC (IV SOLUTION) ×1 IMPLANT
TOWEL OR 17X26 4PK STRL BLUE (TOWEL DISPOSABLE) ×3 IMPLANT

## 2019-11-08 NOTE — Anesthesia Procedure Notes (Signed)
Procedure Name: LMA Insertion Date/Time: 11/08/2019 8:23 AM Performed by: Clyde Lundborg, CRNA Pre-anesthesia Checklist: Patient identified, Emergency Drugs available, Suction available and Patient being monitored Patient Re-evaluated:Patient Re-evaluated prior to induction Oxygen Delivery Method: Circle system utilized Preoxygenation: Pre-oxygenation with 100% oxygen Induction Type: IV induction Ventilation: Mask ventilation without difficulty LMA: LMA flexible inserted LMA Size: 3.5 Tube type: Oral Number of attempts: 1 Placement Confirmation: positive ETCO2,  breath sounds checked- equal and bilateral and CO2 detector Tube secured with: Tape Dental Injury: Teeth and Oropharynx as per pre-operative assessment

## 2019-11-08 NOTE — Discharge Instructions (Signed)
RESUME REGULAR ACTIVITIES TOMORROW    Hysteroscopy, Care After This sheet gives you information about how to care for yourself after your procedure. Your health care provider may also give you more specific instructions. If you have problems or questions, contact your health care provider. What can I expect after the procedure? After the procedure, it is common to have:  Cramping.  Bleeding. This can vary from light spotting to menstrual-like bleeding. Follow these instructions at home: Activity  Rest for 1-2 days after the procedure.  Do not douche, use tampons, or have sex for 2 weeks after the procedure, or until your health care provider approves.  Do not drive for 24 hours after the procedure, or for as long as told by your health care provider.  Do not drive, use heavy machinery, or drink alcohol while taking prescription pain medicines. Medicines   Take over-the-counter and prescription medicines only as told by your health care provider.  Do not take aspirin during recovery. It can increase the risk of bleeding. General instructions  Do not take baths, swim, or use a hot tub until your health care provider approves. Take showers instead of baths for 2 weeks, or for as long as told by your health care provider.  To prevent or treat constipation while you are taking prescription pain medicine, your health care provider may recommend that you: ? Drink enough fluid to keep your urine clear or pale yellow. ? Take over-the-counter or prescription medicines. ? Eat foods that are high in fiber, such as fresh fruits and vegetables, whole grains, and beans. ? Limit foods that are high in fat and processed sugars, such as fried and sweet foods.  Keep all follow-up visits as told by your health care provider. This is important. Contact a health care provider if:  You feel dizzy or lightheaded.  You feel nauseous.  You have abnormal vaginal discharge.  You have a  rash.  You have pain that does not get better with medicine.  You have chills. Get help right away if:  You have bleeding that is heavier than a normal menstrual period.  You have a fever.  You have pain or cramps that get worse.  You develop new abdominal pain.  You faint.  You have pain in your shoulders.  You have shortness of breath. Summary  After the procedure, you may have cramping and some vaginal bleeding.  Do not douche, use tampons, or have sex for 2 weeks after the procedure, or until your health care provider approves.  Do not take baths, swim, or use a hot tub until your health care provider approves. Take showers instead of baths for 2 weeks, or for as long as told by your health care provider.  Report any unusual symptoms to your health care provider.  Keep all follow-up visits as told by your health care provider. This is important. This information is not intended to replace advice given to you by your health care provider. Make sure you discuss any questions you have with your health care provider. Document Revised: 05/05/2017 Document Reviewed: 06/21/2016 Elsevier Patient Education  2020 [employer]    AMBULATORY SURGERY  DISCHARGE INSTRUCTIONS   1) The drugs that you were given will stay in your system until tomorrow so for the next 24 hours you should not:  A) Drive an automobile B) Make any legal decisions C) Drink any alcoholic beverage   2) You may resume regular meals tomorrow.  Today it is better to  start with liquids and gradually work up to solid foods.  You may eat anything you prefer, but it is better to start with liquids, then soup and crackers, and gradually work up to solid foods.   3) Please notify your doctor immediately if you have any unusual bleeding, trouble breathing, redness and pain at the surgery site, drainage, fever, or pain not relieved by medication.    4) Additional Instructions:       Please contact  your physician with any problems or Same Day Surgery at [phone], Monday through Friday 6 am to 4 pm, or [place] at [facility] number at [phone].

## 2019-11-08 NOTE — Anesthesia Postprocedure Evaluation (Signed)
Anesthesia Post Note  Patient: Mandy Deleon  Procedure(s) Performed: DILATATION AND CURETTAGE /HYSTEROSCOPY (Bilateral )  Patient location during evaluation: PACU Anesthesia Type: General Level of consciousness: awake and alert and oriented Pain management: pain level controlled Vital Signs Assessment: post-procedure vital signs reviewed and stable Respiratory status: spontaneous breathing, nonlabored ventilation and respiratory function stable Cardiovascular status: blood pressure returned to baseline and stable Postop Assessment: no signs of nausea or vomiting Anesthetic complications: no     Last Vitals:  Vitals:   11/08/19 1004 11/08/19 1023  BP: 95/62 108/67  Pulse: 72 68  Resp: 12 18  Temp:  (!) 36.2 C  SpO2: 98% 100%    Last Pain:  Vitals:   11/08/19 1023  TempSrc: Temporal  PainSc: 3                  Chrysta Fulcher

## 2019-11-08 NOTE — Op Note (Signed)
    OPERATIVE NOTE 11/08/2019 9:12 AM  PRE-OPERATIVE DIAGNOSIS:  1) Postmenopausal bleeding-endometrial polyp  POST-OPERATIVE DIAGNOSIS:  same  OPERATION:  D&C  SURGEON(S): Surgeon(s) and Role:    Linzie Collin, MD - Primary   ANESTHESIA: General  ESTIMATED BLOOD LOSS: 10 mL  OPERATIVE FINDINGS: endometrial currettings  SPECIMEN:  ID Type Source Tests Collected by Time Destination  1 : endocervical currettings Tissue PATH Gyn biopsy SURGICAL PATHOLOGY Linzie Collin, MD 11/08/2019 [id number]   2 : endometrial currettings Tissue PATH Gyn biopsy SURGICAL PATHOLOGY Linzie Collin, MD 11/08/2019 [phone]     COMPLICATIONS: None  DRAINS: Foley to gravity  DISPOSITION: Stable to recovery room  DESCRIPTION OF PROCEDURE:      The patient was prepped and draped in the dorsal lithotomy position and placed under general anesthesia. Her cervix was grasped with a Jacob's tenaculum. A curettage of the endocervical canal was performed. Then respecting the position and curvature of her uterus, the cervix was dilated to size 8. The hysteroscope was used to view the endometrial cavity. A systematic curettage was performed in all quadrants until a gritty texture was accomplished and no additional tissue was noted. This was sent as a separate specimen.  The tenaculum was removed from the cervix and hemostasis was noted. The weighted speculum was removed and the patient went to recovery room in stable condition.  No follow-up provider specified.  Elonda Husky, M.D. 11/08/2019 9:12 AM

## 2019-11-08 NOTE — Telephone Encounter (Signed)
Error

## 2019-11-08 NOTE — Transfer of Care (Signed)
Immediate Anesthesia Transfer of Care Note  Patient: Mandy Deleon  Procedure(s) Performed: DILATATION AND CURETTAGE /HYSTEROSCOPY (Bilateral )  Patient Location: PACU  Anesthesia Type:General  Level of Consciousness: awake, alert  and oriented  Airway & Oxygen Therapy: Patient Spontanous Breathing and Patient connected to face mask oxygen  Post-op Assessment: Report given to RN and Post -op Vital signs reviewed and stable  Post vital signs: Reviewed and stable  Last Vitals:  Vitals Value Taken Time  BP    Temp    Pulse    Resp    SpO2      Last Pain:  Vitals:   11/08/19 0635  TempSrc: Tympanic  PainSc: 0-No pain         Complications: No apparent anesthesia complications

## 2019-11-08 NOTE — Interval H&P Note (Signed)
History and Physical Interval Note:  11/08/2019 7:15 AM  Mandy Deleon  has presented today for surgery, with the diagnosis of Postmenopausal bleeding-endometrial polyp.  The various methods of treatment have been discussed with the patient and family. After consideration of risks, benefits and other options for treatment, the patient has consented to  Procedure(s): DILATATION AND CURETTAGE /HYSTEROSCOPY (Bilateral) as a surgical intervention.  The patient's history has been reviewed, patient examined, no change in status, stable for surgery.  I have reviewed the patient's chart and labs.  Questions were answered to the patient's satisfaction.     Brennan Bailey

## 2019-11-08 NOTE — Anesthesia Preprocedure Evaluation (Addendum)
Anesthesia Evaluation  Patient identified by MRN, date of birth, ID band Patient awake    Reviewed: Allergy & Precautions, NPO status , Patient's Chart, lab work & pertinent test results  History of Anesthesia Complications Negative for: history of anesthetic complications  Airway Mallampati: II  TM Distance: >3 FB Neck ROM: Full    Dental no notable dental hx.    Pulmonary neg sleep apnea, neg COPD, former smoker,    breath sounds clear to auscultation- rhonchi (-) wheezing      Cardiovascular Exercise Tolerance: Good (-) hypertension(-) CAD, (-) Past MI, (-) Cardiac Stents and (-) CABG  Rhythm:Regular Rate:Normal - Systolic murmurs and - Diastolic murmurs    Neuro/Psych Seizures - (reports a couple seizures per month),  PSYCHIATRIC DISORDERS Depression    GI/Hepatic Neg liver ROS, PUD, GERD  ,  Endo/Other  negative endocrine ROSneg diabetes  Renal/GU negative Renal ROS     Musculoskeletal negative musculoskeletal ROS (+)   Abdominal (+) - obese,   Peds  Hematology negative hematology ROS (+)   Anesthesia Other Findings Past Medical History: No date: Acid reflux No date: Adjustment disorder with depressed mood No date: Depression No date: Diverticulitis No date: 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 No date: Frequent falls     Comment:  d/t seizures No date: Gait instability     Comment:  specifically w/ seizure 1980: History of viral encephalitis     Comment:  causing current seizure disorder No date: Seizures (HCC) No date: Ulcerative colitis (HCC)   Reproductive/Obstetrics                           Anesthesia Physical Anesthesia Plan  ASA: III  Anesthesia Plan: General   Post-op Pain Management:    Induction: Intravenous  PONV Risk Score and Plan: 2 and Ondansetron, Dexamethasone and Midazolam  Airway  Management Planned: LMA  Additional Equipment:   Intra-op Plan:   Post-operative Plan:   Informed Consent: I have reviewed the patients History and Physical, chart, labs and discussed the procedure including the risks, benefits and alternatives for the proposed anesthesia with the patient or authorized representative who has indicated his/her understanding and acceptance.     Dental advisory given  Plan Discussed with: CRNA and Anesthesiologist  Anesthesia Plan Comments:         Anesthesia Quick Evaluation

## 2019-11-11 LAB — SURGICAL PATHOLOGY

## 2019-11-26 ENCOUNTER — Other Ambulatory Visit: Payer: Self-pay

## 2019-11-26 ENCOUNTER — Encounter: Payer: Self-pay | Admitting: Obstetrics and Gynecology

## 2019-11-26 ENCOUNTER — Ambulatory Visit (INDEPENDENT_AMBULATORY_CARE_PROVIDER_SITE_OTHER): Payer: Medicare Other | Admitting: Obstetrics and Gynecology

## 2019-11-26 VITALS — BP 109/72 | HR 80 | Ht 63.0 in | Wt 177.1 lb

## 2019-11-26 DIAGNOSIS — Z9889 Other specified postprocedural states: Secondary | ICD-10-CM

## 2019-11-26 NOTE — Progress Notes (Signed)
HPI:      Mandy Deleon is a 54 y.o. G0P0000 who LMP was No LMP recorded. (Menstrual status: Perimenopausal).  Subjective:   Mandy Deleon presents today approximately 2 weeks postop from [facility] for this menopausal bleeding and polyp.  Pathology reveals no evidence of malignancy.  Patient says Mandy Deleon is not having any bleeding at this time. 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 Mandy Deleon does not have a problem list on file. Mandy Deleon  has a past surgical history that includes Cholecystectomy; vagas nerve stimulator (2011); Appendectomy; Tonsillectomy; Craniectomy / craniotomy for implantation neurostimulator electrodes (2011); Pars plana vitrectomy (Left, 2021); RNS; and Hysteroscopy with D & C (Bilateral, 11/08/2019). Her family history is not on file. Mandy Deleon  reports that Mandy Deleon quit smoking about 46 years ago. Her smoking use included cigarettes. Mandy Deleon has never used smokeless tobacco. Mandy Deleon reports that Mandy Deleon does not drink alcohol and does not use drugs. Mandy Deleon has a current medication list which includes the following prescription(s): acetaminophen, xcopri, cetirizine, famotidine, levetiracetam, melatonin, celontin, nitrofurantoin, oxcarbazepine, and zonisamide. Mandy Deleon is allergic to avocado, aspirin, divalproex sodium, hydroxyzine, lamotrigine, morphine and related, phenobarbital, phenytoin sodium extended, prednisone, zoloft [sertraline], adhesive [tape], ativan [lorazepam], carbamazepine, codeine, iodine, montelukast, and topiramate.       Review of Systems:  Review of  Systems  Constitutional: Denied constitutional symptoms, night sweats, recent illness, fatigue, fever, insomnia and weight loss.  Eyes: Denied eye symptoms, eye pain, photophobia, vision change and visual disturbance.  Ears/Nose/Throat/Neck: Denied ear, nose, throat or neck symptoms, hearing loss, nasal discharge, sinus congestion and sore throat.  Cardiovascular: Denied cardiovascular symptoms, arrhythmia, chest pain/pressure, edema, exercise intolerance, orthopnea and palpitations.  Respiratory: Denied pulmonary symptoms, asthma, pleuritic pain, productive sputum, cough, dyspnea and wheezing.  Gastrointestinal: Denied, gastro-esophageal reflux, melena, nausea and vomiting.  Genitourinary: Denied genitourinary symptoms including symptomatic vaginal discharge, pelvic relaxation issues, and urinary complaints.  Musculoskeletal: Denied musculoskeletal symptoms, stiffness, swelling, muscle weakness and myalgia.  Dermatologic: Denied dermatology symptoms, rash and scar.  Neurologic: Denied neurology symptoms, dizziness, headache, neck pain and syncope.  Psychiatric: Denied psychiatric symptoms, anxiety and depression.  Endocrine: Denied endocrine symptoms including hot flashes and night sweats.   Meds:   Current Outpatient Medications on File Prior to Visit  Medication Sig Dispense Refill  . acetaminophen (TYLENOL) 500 MG tablet Take 1,000 mg by mouth every 6 (six) hours as needed (pain.).     Marland Kitchen Cenobamate (XCOPRI) 100 MG TABS Take 100 mg by mouth at bedtime.    . cetirizine (ZYRTEC) 10 MG tablet Take 10 mg by mouth at bedtime.     . famotidine (PEPCID) 20 MG tablet Take 20 mg by mouth in the morning and at bedtime.     . levETIRAcetam (KEPPRA) 750 MG tablet Take 750 mg by mouth in the morning and at bedtime.     . Melatonin 5 MG CAPS Take 5 mg by mouth at bedtime.     . Methsuximide (CELONTIN) 300 MG CAPS Take 300 mg by mouth 2 (two) times daily.     Marland Kitchen  nitrofurantoin (MACRODANTIN) 50 MG  capsule Take 50 mg by mouth at bedtime. Used to prevent uti's.    . Oxcarbazepine (TRILEPTAL) 300 MG tablet Take 150 mg by mouth at bedtime.     Marland Kitchen zonisamide (ZONEGRAN) 100 MG capsule Take 100 mg by mouth in the morning, at noon, and at bedtime.      No current facility-administered medications on file prior to visit.    Objective:     Vitals:   11/26/19 1044  BP: 109/72  Pulse: 80                Assessment:    G0P0000 There are no problems to display for this patient.    1. Post-operative state     Patient doing well postop   Plan:            1.  Reviewed pathology results in detail with the patient.  2.  Patient to follow-up if Mandy Deleon again begins having vaginal bleeding Orders No orders of the defined types were placed in this encounter.   No orders of the defined types were placed in this encounter.     F/U  Return for Annual Physical, Pt to contact us if symptoms worsen.  Finis Bud, M.D. 11/26/2019 11:00 AM

## 2019-12-04 ENCOUNTER — Other Ambulatory Visit: Payer: Self-pay

## 2019-12-04 ENCOUNTER — Emergency Department: Payer: Medicare Other

## 2019-12-04 ENCOUNTER — Emergency Department
Admission: EM | Admit: 2019-12-04 | Discharge: 2019-12-05 | Disposition: A | Discharge status: Home / Self Care | Payer: Medicare Other | Attending: Emergency Medicine | Admitting: Emergency Medicine

## 2019-12-04 DIAGNOSIS — Y929 Unspecified place or not applicable: Secondary | ICD-10-CM | POA: Diagnosis not present

## 2019-12-04 DIAGNOSIS — Y999 Unspecified external cause status: Secondary | ICD-10-CM | POA: Diagnosis not present

## 2019-12-04 DIAGNOSIS — Y9301 Activity, walking, marching and hiking: Secondary | ICD-10-CM | POA: Diagnosis not present

## 2019-12-04 DIAGNOSIS — M545 Low back pain: Secondary | ICD-10-CM | POA: Diagnosis not present

## 2019-12-04 DIAGNOSIS — W19XXXA Unspecified fall, initial encounter: Secondary | ICD-10-CM | POA: Insufficient documentation

## 2019-12-04 DIAGNOSIS — S0990XA Unspecified injury of head, initial encounter: Secondary | ICD-10-CM | POA: Diagnosis present

## 2019-12-04 DIAGNOSIS — Z87891 Personal history of nicotine dependence: Secondary | ICD-10-CM | POA: Diagnosis not present

## 2019-12-04 DIAGNOSIS — G40909 Epilepsy, unspecified, not intractable, without status epilepticus: Secondary | ICD-10-CM | POA: Insufficient documentation

## 2019-12-04 DIAGNOSIS — S0191XA Laceration without foreign body of unspecified part of head, initial encounter: Secondary | ICD-10-CM | POA: Diagnosis not present

## 2019-12-04 DIAGNOSIS — R569 Unspecified convulsions: Secondary | ICD-10-CM

## 2019-12-04 NOTE — ED Notes (Signed)
Pt refusing EKG because "I'm allergic".

## 2019-12-04 NOTE — ED Notes (Signed)
Pt reports she has a VNS and HNS

## 2019-12-04 NOTE — ED Provider Notes (Signed)
[facility] Emergency Department Provider Note   ____________________________________________   First MD Initiated Contact with Patient 12/04/19 2229     (approximate)  I have reviewed the triage vital signs and the nursing notes.   HISTORY  Chief Complaint Seizures and Head Injury    HPI Mandy Deleon is a 54 y.o. female who has a history of seizures and allergies to multiple antiepileptics.  She also has a VNS and a brain stimulator.  She is seen by [facility] neurology.  She reports she usually has 1 seizure a month.  She had one seizure earlier this month but she does not remember when and had another seizure today.  After having the seizure she got up and was walking and fell and hit her head with loss of consciousness.  She had low back pain earlier but when I see her does not complain of low back pain and is sitting up without any difficulty.         Past Medical History:  Diagnosis Date  . Acid reflux   . Adjustment disorder with depressed mood   . Depression   . Diverticulitis   . 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 Frequent falls    d/t seizures  . Gait instability    specifically w/ seizure  . 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 Seizures (HCC)   . Ulcerative colitis (HCC)     There are no problems to display for this patient.   Past Surgical History:  Procedure Laterality Date  . APPENDECTOMY    . CHOLECYSTECTOMY    . CRANIECTOMY / CRANIOTOMY FOR IMPLANTATION NEUROSTIMULATOR ELECTRODES  2011  . HYSTEROSCOPY WITH D & C Bilateral 11/08/2019   Procedure: DILATATION AND CURETTAGE /HYSTEROSCOPY;  Surgeon: Linzie Collin, MD;  Location: [facility] ORS;  Service: Gynecology;  Laterality: Bilateral;  . PARS PLANA VITRECTOMY Left 2021  . RNS     responsive neurostimulation for seizures. (brain electrodes)  . TONSILLECTOMY    . vagas nerve stimulator   2011    Prior to Admission medications   Medication Sig Start Date End Date Taking? Authorizing Provider  acetaminophen (TYLENOL) 500 MG tablet Take 1,000 mg by mouth every 6 (six) hours as needed (pain.).     [provider]  Cenobamate (XCOPRI) 100 MG TABS Take 100 mg by mouth at bedtime.    [provider]  cetirizine (ZYRTEC) 10 MG tablet Take 10 mg by mouth at bedtime.  03/07/18   [provider]  famotidine (PEPCID) 20 MG tablet Take 20 mg by mouth in the morning and at bedtime.  10/01/18 10/28/20  [provider]  levETIRAcetam (KEPPRA) 750 MG tablet Take 750 mg by mouth in the morning and at bedtime.  04/19/19 04/13/20  [provider]  Melatonin 5 MG CAPS Take 5 mg by mouth at bedtime.     [provider]  Methsuximide (CELONTIN) 300 MG CAPS Take 300 mg by mouth 2 (two) times daily.  05/13/19   [provider]  nitrofurantoin (MACRODANTIN) 50 MG capsule Take 50 mg by mouth at bedtime. Used to prevent uti's.    [provider]  Oxcarbazepine (TRILEPTAL) 300 MG tablet Take 150 mg by mouth at bedtime.  06/29/19 06/23/20  [provider]  zonisamide (ZONEGRAN) 100 MG capsule Take 100 mg by mouth in the morning, at noon, and at bedtime.  05/13/19  05/07/20  [provider]    Allergies Avocado, Aspirin, Divalproex sodium, Hydroxyzine, Lamotrigine, Morphine and related, Phenobarbital, Phenytoin sodium extended, Prednisone, Zoloft [sertraline], Adhesive [tape], Ativan [lorazepam], Carbamazepine, Codeine, Iodine, Montelukast, and Topiramate  No family history on file.  Social History Social History   Tobacco Use  . Smoking status: Former Smoker    Types: Cigarettes    Quit date: 1975    Years since quitting: 46.5  . Smokeless tobacco: Never Used  Vaping Use  . Vaping Use: Never used  Substance Use Topics  . Alcohol use: No  . Drug use: No    Review of Systems  Constitutional: No  fever/chills Eyes: No visual changes. ENT: No sore throat. Cardiovascular: Denies chest pain. Respiratory: Denies shortness of breath. Gastrointestinal: No abdominal pain.  No nausea, no vomiting.  No diarrhea.  No constipation. Genitourinary: Negative for dysuria. Musculoskeletal: Negative for back pain. Skin: Negative for rash. Neurological: Negative for  focal weakness    ____________________________________________   PHYSICAL EXAM:  VITAL SIGNS: ED Triage Vitals  Enc Vitals Group     BP 12/04/19 2220 118/88     Pulse Rate 12/04/19 2220 94     Resp 12/04/19 2220 17     Temp 12/04/19 2220 99 F (37.2 C)     Temp Source 12/04/19 2220 Oral     SpO2 12/04/19 2220 100 %     Weight 12/04/19 2213 165 lb (74.8 kg)     Height 12/04/19 2213 5\' 3"  (1.6 m)     Head Circumference --      Peak Flow --      Pain Score 12/04/19 2213 8     Pain Loc --      Pain Edu? --      Excl. in GC? --     Constitutional: Alert and oriented. Well appearing and in no acute distress. Eyes: Conjunctivae are normal. PER. EOMI. Head: Laceration right anterior head. Nose: No congestion/rhinnorhea. Mouth/Throat: Mucous membranes are moist.  Oropharynx non-erythematous. Neck: No stridor. Cardiovascular: Normal rate, regular rhythm. Grossly normal heart sounds.  Good peripheral circulation. Respiratory: Normal respiratory effort.  No retractions. Lungs CTAB. Gastrointestinal: Soft and nontender. No distention. No abdominal bruits. No CVA tenderness. Musculoskeletal: No lower extremity tenderness nor edema. Neurologic:  Normal speech and language. No gross focal neurologic deficits are appreciated.  Annual nerves II through XII are intact cerebellar finger-nose rapid alternating movements are normal motor strength is 5/5 throughout patient does not report any numbness. Skin:  Skin is warm, dry and intact. No rash noted. Psychiatric: Mood and affect are normal. Speech and behavior are  normal.  ____________________________________________   LABS (all labs ordered are listed, but only abnormal results are displayed)  Labs Reviewed  BASIC METABOLIC PANEL  CBC   ____________________________________________  EKG  EKG read interpreted by me shows normal sinus rhythm rate of 73 normal axis no acute ST-T wave changes ____________________________________________  RADIOLOGY  ED MD interpretation: CT of the head neck read by radiology reviewed by me showed no acute disease  Official radiology report(s): CT Head Wo Contrast  Result Date: 12/04/2019 CLINICAL DATA:  Seizure, fall. EXAM: CT HEAD WITHOUT CONTRAST TECHNIQUE: Contiguous axial images were obtained from the base of the skull through the vertex without intravenous contrast. COMPARISON:  02/16/2019 FINDINGS: Brain: Extensive artifact again noted from right-sided stimulator limits the exam. Lead tips in the temporal lobes bilaterally. No visible acute intracranial process. No visible hemorrhage, hydrocephalus or acute infarct. Vascular: No hyperdense  vessel or unexpected calcification. Skull: Prior right craniotomy.  No acute calvarial abnormality. Sinuses/Orbits: Gas seen within the left globe, presumably postoperative. Recommend clinical correlation. Paranasal sinuses are clear. Other: None IMPRESSION: Neural stimulators within the temporal lobes, stable. Artifact from the stimulator pack on the right causes artifact and limits study. No definite acute intracranial abnormality. Electronically Signed   By: Charlett Nose M.D.   On: 12/04/2019 23:28   CT Cervical Spine Wo Contrast  Result Date: 12/04/2019 CLINICAL DATA:  Mental status changes, seizure EXAM: CT CERVICAL SPINE WITHOUT CONTRAST TECHNIQUE: Multidetector CT imaging of the cervical spine was performed without intravenous contrast. Multiplanar CT image reconstructions were also generated. COMPARISON:  None. FINDINGS: Alignment: Normal Skull base and vertebrae: No  acute fracture. No primary bone lesion or focal pathologic process. Soft tissues and spinal canal: No prevertebral fluid or swelling. No visible canal hematoma. Disc levels: Early spurring anteriorly. Mild to moderate diffuse degenerative facet disease bilaterally. Upper chest: Negative Other: None IMPRESSION: Degenerative changes.  No acute bony abnormality. Electronically Signed   By: Charlett Nose M.D.   On: 12/04/2019 23:23    ____________________________________________   PROCEDURES  Procedure(s) performed (including Critical Care): Patient with a 1-1/2 cm vertical laceration on the right side of the forehead.  This was anesthetized with 1% lidocaine with epi.  Patient reports she gets lightheaded with epi but she did well with this injection of epinephrine.  The wound was irrigated with saline and explored I did not see any foreign bodies.  The galea was not penetrated.  The wound was closed with 4 stitches of 4-0 nylon.  Patient tolerated very well.  Wound came together nicely.  Procedures   ____________________________________________   INITIAL IMPRESSION / ASSESSMENT AND PLAN / ED COURSE  Patient reports her tetanus shot is up-to-date.  She is taking all of her medicine. I am waiting for [facility] to call back to double check with them and make sure that there is no changes they want to make in her antiepileptic regimen.  I will sign the patient out to Dr. Colon Branch.  Aside from talking to [facility] she should be ready to go.             ____________________________________________   FINAL CLINICAL IMPRESSION(S) / ED DIAGNOSES  Final diagnoses:  Seizure (HCC)  Laceration of head without foreign body, unspecified part of head, initial encounter     ED Discharge Orders    None       Note:  This document was prepared using Dragon voice recognition software and may include unintentional dictation errors.    Arnaldo Natal, MD 12/05/19 [phone]

## 2019-12-04 NOTE — ED Triage Notes (Signed)
Pt arrives to ED via [facility] from home with c/o seizures and head injury. Pt reports h/x of seizures and takes medications as directed. Pt reports falling and hitting her head with (+) LOC. Pt also with c/o lower back pain. Pt is A&O, in NAD; RR even, regular, and unlabored. No recent illness; pt denies N/V/D or fever.

## 2019-12-04 NOTE — ED Notes (Signed)
Had seizure today hx of epilepsy, fall with lac to right eye.

## 2019-12-05 ENCOUNTER — Emergency Department: Payer: Medicare Other

## 2019-12-05 DIAGNOSIS — S0191XA Laceration without foreign body of unspecified part of head, initial encounter: Secondary | ICD-10-CM | POA: Diagnosis not present

## 2019-12-05 LAB — CBC
HCT: 38.1 % (ref 36.0–46.0)
Hemoglobin: 13.1 g/dL (ref 12.0–15.0)
MCH: 32.9 pg (ref 26.0–34.0)
MCHC: 34.4 g/dL (ref 30.0–36.0)
MCV: 95.7 fL (ref 80.0–100.0)
Platelets: 256 10*3/uL (ref 150–400)
RBC: 3.98 MIL/uL (ref 3.87–5.11)
RDW: 12.7 % (ref 11.5–15.5)
WBC: 9.8 10*3/uL (ref 4.0–10.5)
nRBC: 0 % (ref 0.0–0.2)

## 2019-12-05 LAB — BASIC METABOLIC PANEL
Anion gap: 9 (ref 5–15)
BUN: 26 mg/dL — ABNORMAL HIGH (ref 6–20)
CO2: 21 mmol/L — ABNORMAL LOW (ref 22–32)
Calcium: 8.7 mg/dL — ABNORMAL LOW (ref 8.9–10.3)
Chloride: 109 mmol/L (ref 98–111)
Creatinine, Ser: 1.35 mg/dL — ABNORMAL HIGH (ref 0.44–1.00)
GFR calc Af Amer: 51 mL/min — ABNORMAL LOW (ref >60)
GFR calc non Af Amer: 44 mL/min — ABNORMAL LOW (ref >60)
Glucose, Bld: 104 mg/dL — ABNORMAL HIGH (ref 70–99)
Potassium: 4.4 mmol/L (ref 3.5–5.1)
Sodium: 139 mmol/L (ref 135–145)

## 2019-12-05 MED ORDER — BACITRACIN ZINC 500 UNIT/GM EX OINT
TOPICAL_OINTMENT | CUTANEOUS | Status: AC
  Administered 2019-12-05: 1
  Filled 2019-12-05: qty 0.9

## 2019-12-05 MED ORDER — BACITRACIN-NEOMYCIN-POLYMYXIN 400-5-5000 EX OINT: TOPICAL_OINTMENT | Freq: Once | CUTANEOUS | Status: DC

## 2019-12-05 MED ORDER — ACETAMINOPHEN 500 MG PO TABS
1000.0000 mg | ORAL_TABLET | Freq: Once | ORAL | Status: AC
  Administered 2019-12-05: 1000 mg via ORAL
  Filled 2019-12-05: qty 2

## 2019-12-05 MED ORDER — LIDOCAINE-EPINEPHRINE 2 %-1:100000 IJ SOLN
20.0000 mL | Freq: Once | INTRAMUSCULAR | Status: AC
  Administered 2019-12-05: 20 mL via INTRADERMAL

## 2019-12-05 NOTE — ED Notes (Signed)
Pt sister Kriste Basque in chart contacted, will transport home. ETA 40 min

## 2019-12-05 NOTE — Discharge Instructions (Addendum)
Keep the wound clean and dry.  You can use triple antibiotic ointment to cover it.  Have the wound checked in 2 days to make sure there is no signs of infection like increasing swelling pain redness or pus.  Stitches out in 5 days.  You can shower and pat the area dry.  Do not rub it.  Do not submerge in standing water.  Please give your neurologist a call in the morning and arrange follow-up.

## 2019-12-05 NOTE — ED Provider Notes (Signed)
XR of the hip is unremarkable.  Discussed case with [facility] Neurology.  At this time, they do not recommend any immediate changes to her medication regimen.  They will follow up in clinic.  Updated patient on results and plan of care.  She voices understanding and is comfortable with the plan and discharge.  Given return precautions.   Miguel Aschoff., MD 12/05/19 [phone]

## 2019-12-13 ENCOUNTER — Ambulatory Visit (INDEPENDENT_AMBULATORY_CARE_PROVIDER_SITE_OTHER): Payer: Medicare Other | Admitting: Internal Medicine

## 2019-12-13 ENCOUNTER — Other Ambulatory Visit: Payer: Self-pay

## 2019-12-13 ENCOUNTER — Encounter: Payer: Self-pay | Admitting: Internal Medicine

## 2019-12-13 VITALS — BP 103/66 | HR 88 | Ht 63.0 in | Wt 176.8 lb

## 2019-12-13 DIAGNOSIS — S060X1A Concussion with loss of consciousness of 30 minutes or less, initial encounter: Secondary | ICD-10-CM | POA: Insufficient documentation

## 2019-12-13 DIAGNOSIS — S060X1D Concussion with loss of consciousness of 30 minutes or less, subsequent encounter: Secondary | ICD-10-CM

## 2019-12-13 DIAGNOSIS — S062X9A Diffuse traumatic brain injury with loss of consciousness of unspecified duration, initial encounter: Secondary | ICD-10-CM | POA: Insufficient documentation

## 2019-12-13 DIAGNOSIS — S062X1D Diffuse traumatic brain injury with loss of consciousness of 30 minutes or less, subsequent encounter: Secondary | ICD-10-CM | POA: Diagnosis not present

## 2019-12-13 DIAGNOSIS — G40309 Generalized idiopathic epilepsy and epileptic syndromes, not intractable, without status epilepticus: Secondary | ICD-10-CM | POA: Diagnosis not present

## 2019-12-13 DIAGNOSIS — S062XAA Diffuse traumatic brain injury with loss of consciousness status unknown, initial encounter: Secondary | ICD-10-CM | POA: Insufficient documentation

## 2019-12-13 HISTORY — DX: Concussion with loss of consciousness of 30 minutes or less, initial encounter: S06.0X1A

## 2019-12-13 NOTE — Progress Notes (Signed)
Established Patient Office Visit  SUBJECTIVE:  Subjective  Patient ID: Mandy Deleon, female    DOB: 1966/04/28  Age: 54 y.o. MRN: [id number]  CC:  Chief Complaint  Patient presents with  . Suture / Staple Removal    Pt presents today to have her sutures removed. Patient states she had a fall last Wednesday     HPI Mandy Deleon is a 54 y.o. female presenting today for stitch removal.   She had a seizure on 12/04/2019 and fell and hit her head. She had a 1.5 cm vertical laceration on the right side of her forehead. Her wound was closed with 4 stitches of 4-0 nylon. Head CT revealed neural stimulators within the temporal lobes, stable. Artifact from the stimulator pack on the right causes artifact and limits study. No definite acute intracranial abnormality. CT cervical spine revealed degenerative changes.  No acute bony abnormality. Hip xray revealed unremarkable pelvis and right hip.   She notes that she thinks she was running a slight fever the day of her seizure, which is what may have brought on her seizure.   Past Medical History:  Diagnosis Date  . Acid reflux   . Adjustment disorder with depressed mood   . Depression   . Diverticulitis   . 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 Frequent falls    d/t seizures  . Gait instability    specifically w/ seizure  . 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 Seizures (HCC)   . Ulcerative colitis [facility])     Past Surgical History:  Procedure Laterality Date  . APPENDECTOMY    . CHOLECYSTECTOMY    . CRANIECTOMY / CRANIOTOMY FOR IMPLANTATION NEUROSTIMULATOR ELECTRODES  2011  . HYSTEROSCOPY WITH D & C Bilateral 11/08/2019   Procedure: DILATATION AND CURETTAGE /HYSTEROSCOPY;  Surgeon: Linzie Collin, MD;  Location: [facility] ORS;  Service: Gynecology;  Laterality: Bilateral;  . PARS PLANA VITRECTOMY Left 2021  . RNS     responsive  neurostimulation for seizures. (brain electrodes)  . TONSILLECTOMY    . vagas nerve stimulator  2011    History reviewed. No pertinent family history.  Social History   Socioeconomic History  . Marital status: Widowed    Spouse name: Not on file  . Number of children: Not on file  . Years of education: Not on file  . Highest education level: Not on file  Occupational History    Comment: disabled  Tobacco Use  . Smoking status: Former Smoker    Types: Cigarettes    Quit date: 1975    Years since quitting: 46.5  . Smokeless tobacco: Never Used  Vaping Use  . Vaping Use: Never used  Substance and Sexual Activity  . Alcohol use: No  . Drug use: No  . Sexual activity: Not Currently  Other Topics Concern  . Not on file  Social History Narrative   Patient lives alone, with her dog.  Her older brother who lives an hour away is very helpful for her.   He will bring her to the hospital but she is not sure who will stay with her after surgery.   Social Determinants of Health   Financial Resource Strain:   . Difficulty of Paying Living Expenses:   Food Insecurity:   . Worried About Programme researcher, broadcasting/film/video in the Last Year:   . [employer] of Food in the Last  Year:   Transportation Needs:   . Freight forwarder (Medical):   Marland Kitchen Lack of Transportation (Non-Medical):   Physical Activity:   . Days of Exercise per Week:   . Minutes of Exercise per Session:   Stress:   . Feeling of Stress :   Social Connections:   . Frequency of Communication with Friends and Family:   . Frequency of Social Gatherings with Friends and Family:   . Attends Religious Services:   . Active Member of Clubs or Organizations:   . Attends Banker Meetings:   Marland Kitchen Marital Status:   Intimate Partner Violence:   . Fear of Current or Ex-Partner:   . Emotionally Abused:   Marland Kitchen Physically Abused:   . Sexually Abused:      Current Outpatient Medications:  .  acetaminophen (TYLENOL) 500 MG tablet,  Take 1,000 mg by mouth every 6 (six) hours as needed (pain.). , Disp: , Rfl:  .  Cenobamate (XCOPRI) 100 MG TABS, Take 100 mg by mouth daily. , Disp: , Rfl:  .  cetirizine (ZYRTEC) 10 MG tablet, Take 10 mg by mouth at bedtime. , Disp: , Rfl:  .  famotidine (PEPCID) 20 MG tablet, Take 20 mg by mouth in the morning and at bedtime. , Disp: , Rfl:  .  levETIRAcetam (KEPPRA) 750 MG tablet, Take 750 mg by mouth in the morning and at bedtime. , Disp: , Rfl:  .  Melatonin 5 MG CAPS, Take 5 mg by mouth at bedtime. , Disp: , Rfl:  .  Methsuximide (CELONTIN) 300 MG CAPS, Take 300 mg by mouth in the morning, at noon, and at bedtime. , Disp: , Rfl:  .  neomycin-polymyxin-dexameth (MAXITROL) 0.1 % OINT, Use four times a day in the operative eye as needed for discomfort, Disp: , Rfl:  .  nitrofurantoin (MACRODANTIN) 50 MG capsule, Take 50 mg by mouth at bedtime. Used to prevent uti's. (Patient not taking: Reported on 12/05/2019), Disp: , Rfl:  .  Oxcarbazepine (TRILEPTAL) 300 MG tablet, Take 150 mg by mouth at bedtime. , Disp: , Rfl:  .  prednisoLONE acetate (PRED FORTE) 1 % ophthalmic suspension, 1 drop in the operative eye, four times per day, Disp: , Rfl:  .  zonisamide (ZONEGRAN) 100 MG capsule, Take 100 mg by mouth in the morning, at noon, and at bedtime. , Disp: , Rfl:    Allergies  Allergen Reactions  . Avocado Anaphylaxis  . Aspirin Other (See Comments)    Aspirin and all aspirin products are not compatible with her seizure medications.  . Divalproex Sodium     Designed for genetic epilepsy, not encephalitic  . Hydroxyzine Other (See Comments)    Caused her to go into cluster seizures  . Lamotrigine Other (See Comments)    Designed for generic epilepsy, not encephalitic  . Morphine And Related Other (See Comments)    Made her feel like she was floating.  That is how she feels when having a seizure. It can cause me to go into a seizure  . Phenobarbital Other (See Comments)    Designed for genetic  epilepsy, not encephalitic  . Phenytoin Sodium Extended Other (See Comments)    Designed for genetic epilepsy, not encephalitic  . Prednisone Other (See Comments)    Caused her to go into cluster seizures  . Zoloft [Sertraline] Other (See Comments)    Made seizures worse  . Adhesive [Tape] Other (See Comments)    Welt to skin. Rash PAPER  TAPE IS OKAY  . Ativan [Lorazepam] Nausea And Vomiting  . Carbamazepine Other (See Comments)    Designed for genetic epilepsy, not encephalitic  . Codeine Other (See Comments)    "makes me loopy"  . Iodine Rash    Iodine on the skin causes hives  . Montelukast Other (See Comments)    Patient was told that it was best that she NOT take this medication  . Topiramate Other (See Comments)    Did not work for patient    ROS Review of Systems  Constitutional: Negative.   HENT: Negative.   Eyes: Negative.   Respiratory: Negative.   Cardiovascular: Negative.   Gastrointestinal: Negative.   Endocrine: Negative.   Genitourinary: Negative.   Musculoskeletal: Negative.   Skin: Negative.   Allergic/Immunologic: Negative.   Neurological: Positive for seizures (12/04/2019).  Hematological: Negative.   Psychiatric/Behavioral: Negative.   All other systems reviewed and are negative.    OBJECTIVE:    Physical Exam Vitals reviewed.  Constitutional:      Appearance: Normal appearance.  HENT:     Mouth/Throat:     Mouth: Mucous membranes are moist.  Eyes:     Pupils: Pupils are equal, round, and reactive to light.  Cardiovascular:     Rate and Rhythm: Normal rate and regular rhythm.     Pulses: Normal pulses.     Heart sounds: Normal heart sounds.  Pulmonary:     Effort: Pulmonary effort is normal.     Breath sounds: Normal breath sounds.  Abdominal:     Palpations: There is no hepatomegaly, splenomegaly or mass.     Tenderness: There is no abdominal tenderness.  Musculoskeletal:     Right lower leg: No edema.     Left lower leg: No edema.   Skin:    Findings: Bruising (w/ hematoma on R buttock, & R back) present.  Neurological:     Mental Status: She is alert and oriented to person, place, and time.     Motor: Motor function is intact.     Coordination: Coordination is intact.  Psychiatric:        Mood and Affect: Mood and affect normal.        Behavior: Behavior normal.     BP 103/66   Pulse 88   Ht 5\' 3"  (1.6 m)   Wt 176 lb 12.8 oz (80.2 kg)   BMI 31.32 kg/m  Wt Readings from Last 3 Encounters:  12/13/19 176 lb 12.8 oz (80.2 kg)  12/04/19 165 lb (74.8 kg)  11/26/19 177 lb 1.6 oz (80.3 kg)    Health Maintenance Due  Topic Date Due  . Hepatitis C Screening  Never done  . COVID-19 Vaccine (1) Never done  . HIV Screening  Never done  . PAP SMEAR-Modifier  Never done  . MAMMOGRAM  Never done  . COLONOSCOPY  Never done    There are no preventive care reminders to display for this patient.  CBC Latest Ref Rng & Units 12/05/2019 11/06/2019 02/16/2019  WBC 4.0 - 10.5 K/uL 9.8 5.8 9.1  Hemoglobin 12.0 - 15.0 g/dL 04/18/2019 64.4 03.4  Hematocrit 36 - 46 % 38.1 41.4 38.7  Platelets 150 - 400 K/uL 256 263 283   CMP Latest Ref Rng & Units 12/05/2019 02/16/2019 06/27/2016  Glucose 70 - 99 mg/dL 06/29/2016) 595(G) 95  BUN 6 - 20 mg/dL 387(F) 17 11  Creatinine 0.44 - 1.00 mg/dL 64(P) 3.29(J 1.88  Sodium 135 - 145 mmol/L 139 137  133(L)  Potassium 3.5 - 5.1 mmol/L 4.4 4.4 3.3(L)  Chloride 98 - 111 mmol/L 109 106 101  CO2 22 - 32 mmol/L 21(L) 23 22  Calcium 8.9 - 10.3 mg/dL 1.6(X8.7(L) 9.2 0.9(U8.5(L)    No results found for: TSH Lab Results  Component Value Date   ANIONGAP 9 12/05/2019   No results found for: CHOL, HDL, LDLCALC, CHOLHDL No results found for: TRIG No results found for: HGBA1C    ASSESSMENT & PLAN:   Problem List Items Addressed This Visit      Nervous and Auditory   Nonintractable generalized idiopathic epilepsy without status epilepticus (HCC)    Has been epileptic fit  7 days ago.  She fell down and lacerated  the right forehead laceration was stitched in [facility].  4 stitches were applied.  Sedation site looks okay without any infection patient did not have any recurrent seizure after that.      Laceration and contusion of  head [facility](HCC)    Patient on the right forehead.  4 stitches were removed without anesthesia patient tolerated the procedure well no sign of any infection.  Also has a contusion in the right lower back and also in the right buttock.  Small hematoma on the right buttock.  Not tender in the lower lumbar spine and gait is normal.  She did not  Have any further syncope or focal neurological sign      Concussion wth loss of consciousness of 30 minutes or less - Primary    Patient has a complete  neurological examination today which did not reveal any focal neurological sign.         No orders of the defined types were placed in this encounter.  Patient has a seizure disorder and she hit the right side of the forehead.  she was seen in the emergency room and CT scan did not show any bleeding.  CT of the back was also performed was found to have a hematoma on the right inner cheek of the buttock. Bruise right lumbar region.  Gait is normal she does not have any focal neurological sign on physical examination denies any headache or further loss of consciousness.  Follow-up: Return in about 3 months (around 03/14/2020).    Dr. Woodroe ChenJaved Uzziah Rigg [facility] [address], [place], [place] [id number]   By signing my name below, I, [employer], attest that this documentation has been prepared under the direction and in the presence of Corky DownsMasoud, Brodan Grewell, MD. Electronically Signed: Corky DownsJaved Carvell Hoeffner, MD 12/13/19, 3:24 PM  I personally performed the services described in this documentation, which was SCRIBED in my presence. The recorded information has been reviewed and considered accurate. It has been edited as necessary during review. Corky DownsJaved Nicie Milan, MD

## 2019-12-13 NOTE — Assessment & Plan Note (Signed)
Patient has a complete  neurological examination today which did not reveal any focal neurological sign.

## 2019-12-13 NOTE — Assessment & Plan Note (Signed)
Patient on the right forehead.  4 stitches were removed without anesthesia patient tolerated the procedure well no sign of any infection.  Also has a contusion in the right lower back and also in the right buttock.  Small hematoma on the right buttock.  Not tender in the lower lumbar spine and gait is normal.  She did not  Have any further syncope or focal neurological sign

## 2019-12-13 NOTE — Assessment & Plan Note (Signed)
Has been epileptic fit  7 days ago.  She fell down and lacerated the right forehead laceration was stitched in [facility].  4 stitches were applied.  Sedation site looks okay without any infection patient did not have any recurrent seizure after that.

## 2020-01-13 ENCOUNTER — Ambulatory Visit (INDEPENDENT_AMBULATORY_CARE_PROVIDER_SITE_OTHER): Payer: Medicare Other | Admitting: Internal Medicine

## 2020-01-13 ENCOUNTER — Encounter: Payer: Self-pay | Admitting: Internal Medicine

## 2020-01-13 ENCOUNTER — Other Ambulatory Visit: Payer: Self-pay

## 2020-01-13 VITALS — BP 133/87 | HR 95 | Ht 63.0 in | Wt 179.2 lb

## 2020-01-13 DIAGNOSIS — E8881 Metabolic syndrome: Secondary | ICD-10-CM

## 2020-01-13 DIAGNOSIS — S23428A Other sprain of sternum, initial encounter: Secondary | ICD-10-CM | POA: Insufficient documentation

## 2020-01-13 DIAGNOSIS — G40309 Generalized idiopathic epilepsy and epileptic syndromes, not intractable, without status epilepticus: Secondary | ICD-10-CM

## 2020-01-13 DIAGNOSIS — R1013 Epigastric pain: Secondary | ICD-10-CM | POA: Diagnosis not present

## 2020-01-13 NOTE — Assessment & Plan Note (Signed)
Patient did not have any more episode of epilepsy she is being followed up by his neurologist.  In the   Rt occiput  She has a device and was placed on the right occipital region which controlled and he manages the epilepsy.  On the left side she has a device where she can  use magnetic stimulator device.  to control  onset of epilepsy.

## 2020-01-13 NOTE — Assessment & Plan Note (Signed)
-   I encouraged the patient to lose weight.  - I educated them on making healthy dietary choices including eating more fruits and vegetables and less fried foods. - I encouraged the patient to exercise more, and educated on the benefits of exercise including weigh reduction

## 2020-01-13 NOTE — Progress Notes (Signed)
Established Patient Office Visit  SUBJECTIVE:  Subjective  Patient ID: Mandy Deleon, female    DOB: 1965-12-14  Age: 54 y.o. MRN: [id number]  CC:  Chief Complaint  Patient presents with  . Abdominal Pain    Patient has abdominal pain located in the upper right quaderant, pt states she has had the pain since a fall she had on June 30th due to a seizure.    HPI Mandy Deleon is a 54 y.o. female presenting today for an evaluation of her URQ abdominal pain.   Fall The accident occurred more than 1 week ago. The fall occurred while standing. The volume of blood lost was minimal. The point of impact was the head. Pain location: Under right breast at rib. Associated symptoms include abdominal pain. Pertinent negatives include no hematuria or nausea.  Abdominal Pain This is a new problem. The current episode started 1 to 4 weeks ago. The onset quality is sudden. The problem has been unchanged. The pain is at a severity of 8/10. The pain is severe. The abdominal pain radiates to the RUQ (Right lower rib). Associated symptoms include melena. Pertinent negatives include no constipation, hematuria or nausea. The pain is aggravated by coughing, movement, certain positions, deep breathing and bowel movement. The pain is relieved by certain positions. She has tried antacids for the symptoms. The treatment provided no relief. cholecystectomy      Past Medical History:  Diagnosis Date  . Acid reflux   . Adjustment disorder with depressed mood   . Depression   . Diverticulitis   . 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 Frequent falls    d/t seizures  . Gait instability    specifically w/ seizure  . 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 Seizures (HCC)   . Ulcerative colitis [facility])     Past Surgical History:  Procedure Laterality Date  . APPENDECTOMY    . CHOLECYSTECTOMY    . CRANIECTOMY /  CRANIOTOMY FOR IMPLANTATION NEUROSTIMULATOR ELECTRODES  2011  . HYSTEROSCOPY WITH D & C Bilateral 11/08/2019   Procedure: DILATATION AND CURETTAGE /HYSTEROSCOPY;  Surgeon: Linzie Collin, MD;  Location: [facility] ORS;  Service: Gynecology;  Laterality: Bilateral;  . PARS PLANA VITRECTOMY Left 2021  . RNS     responsive neurostimulation for seizures. (brain electrodes)  . TONSILLECTOMY    . vagas nerve stimulator  2011    History reviewed. No pertinent family history.  Social History   Socioeconomic History  . Marital status: Widowed    Spouse name: Not on file  . Number of children: Not on file  . Years of education: Not on file  . Highest education level: Not on file  Occupational History    Comment: disabled  Tobacco Use  . Smoking status: Former Smoker    Types: Cigarettes    Quit date: 1975    Years since quitting: 46.6  . Smokeless tobacco: Never Used  Vaping Use  . Vaping Use: Never used  Substance and Sexual Activity  . Alcohol use: No  . Drug use: No  . Sexual activity: Not Currently  Other Topics Concern  . Not on file  Social History Narrative   Patient lives alone, with her dog.  Her older brother who lives an hour away is very helpful for her.   He will bring her to the hospital but she is not sure who will stay  with her after surgery.   Social Determinants of Health   Financial Resource Strain:   . Difficulty of Paying Living Expenses:   Food Insecurity:   . Worried About Programme researcher, broadcasting/film/videounning Out of Food in the Last Year:   . Baristaan Out of Food in the Last Year:   Transportation Needs:   . Freight forwarderLack of Transportation (Medical):   Marland Kitchen. Lack of Transportation (Non-Medical):   Physical Activity:   . Days of Exercise per Week:   . Minutes of Exercise per Session:   Stress:   . Feeling of Stress :   Social Connections:   . Frequency of Communication with Friends and Family:   . Frequency of Social Gatherings with Friends and Family:   . Attends Religious Services:   . Active  Member of Clubs or Organizations:   . Attends BankerClub or Organization Meetings:   Marland Kitchen. Marital Status:   Intimate Partner Violence:   . Fear of Current or Ex-Partner:   . Emotionally Abused:   Marland Kitchen. Physically Abused:   . Sexually Abused:      Current Outpatient Medications:  .  acetaminophen (TYLENOL) 500 MG tablet, Take 1,000 mg by mouth every 6 (six) hours as needed (pain.). , Disp: , Rfl:  .  Cenobamate (XCOPRI) 100 MG TABS, Take 150 mg by mouth daily. , Disp: , Rfl:  .  cetirizine (ZYRTEC) 10 MG tablet, Take 10 mg by mouth at bedtime. , Disp: , Rfl:  .  famotidine (PEPCID) 20 MG tablet, Take 20 mg by mouth in the morning and at bedtime. , Disp: , Rfl:  .  levETIRAcetam (KEPPRA) 750 MG tablet, Take 750 mg by mouth in the morning and at bedtime. , Disp: , Rfl:  .  Melatonin 5 MG CAPS, Take 5 mg by mouth at bedtime. , Disp: , Rfl:  .  Methsuximide (CELONTIN) 300 MG CAPS, Take 300 mg by mouth at bedtime. , Disp: , Rfl:  .  nitrofurantoin (MACRODANTIN) 50 MG capsule, Take 50 mg by mouth at bedtime. Used to prevent uti's., Disp: , Rfl:  .  zonisamide (ZONEGRAN) 100 MG capsule, Take 100 mg by mouth in the morning, at noon, and at bedtime. , Disp: , Rfl:    Allergies  Allergen Reactions  . Avocado Anaphylaxis  . Aspirin Other (See Comments)    Aspirin and all aspirin products are not compatible with her seizure medications.  . Divalproex Sodium     Designed for genetic epilepsy, not encephalitic  . Hydroxyzine Other (See Comments)    Caused her to go into cluster seizures  . Lamotrigine Other (See Comments)    Designed for generic epilepsy, not encephalitic  . Morphine And Related Other (See Comments)    Made her feel like she was floating.  That is how she feels when having a seizure. It can cause me to go into a seizure  . Phenobarbital Other (See Comments)    Designed for genetic epilepsy, not encephalitic  . Phenytoin Sodium Extended Other (See Comments)    Designed for genetic  epilepsy, not encephalitic  . Prednisone Other (See Comments)    Caused her to go into cluster seizures  . Zoloft [Sertraline] Other (See Comments)    Made seizures worse  . Adhesive [Tape] Other (See Comments)    Welt to skin. Rash PAPER TAPE IS OKAY  . Ativan [Lorazepam] Nausea And Vomiting  . Carbamazepine Other (See Comments)    Designed for genetic epilepsy, not encephalitic  . Codeine Other (  See Comments)    "makes me loopy"  . Iodine Rash    Iodine on the skin causes hives  . Montelukast Other (See Comments)    Patient was told that it was best that she NOT take this medication  . Topiramate Other (See Comments)    Did not work for patient    ROS Review of Systems  Constitutional: Negative.   HENT: Negative.   Eyes: Negative.   Respiratory: Negative.   Cardiovascular: Negative.   Gastrointestinal: Positive for abdominal pain and melena. Negative for constipation and nausea.  Endocrine: Negative.   Genitourinary: Negative.  Negative for hematuria.  Musculoskeletal: Negative.   Skin: Negative.   Allergic/Immunologic: Negative.   Neurological: Negative.   Hematological: Negative.   Psychiatric/Behavioral: Negative.   All other systems reviewed and are negative.    OBJECTIVE:    Physical Exam Vitals reviewed.  Constitutional:      Appearance: Normal appearance.  HENT:     Mouth/Throat:     Mouth: Mucous membranes are moist.  Eyes:     Pupils: Pupils are equal, round, and reactive to light.  Neck:     Vascular: No carotid bruit.     Comments: Vagus nerve stimulator implant scar on lower left neck Cardiovascular:     Rate and Rhythm: Normal rate and regular rhythm.     Pulses: Normal pulses.     Heart sounds: Normal heart sounds.  Pulmonary:     Effort: Pulmonary effort is normal.     Breath sounds: Normal breath sounds.  Abdominal:     General: Bowel sounds are normal.     Palpations: Abdomen is soft. There is no hepatomegaly, splenomegaly or mass.      Tenderness: There is abdominal tenderness in the right upper quadrant.     Hernia: No hernia is present.  Musculoskeletal:        General: No tenderness.     Cervical back: Neck supple.     Right lower leg: No edema.     Left lower leg: No edema.  Skin:    Findings: No rash.  Neurological:     Mental Status: She is alert and oriented to person, place, and time.     Motor: No weakness.  Psychiatric:        Mood and Affect: Mood and affect normal.        Behavior: Behavior normal.     BP 133/87   Pulse 95   Ht 5\' 3"  (1.6 m)   Wt 179 lb 3.2 oz (81.3 kg)   BMI 31.74 kg/m  Wt Readings from Last 3 Encounters:  01/13/20 179 lb 3.2 oz (81.3 kg)  12/13/19 176 lb 12.8 oz (80.2 kg)  12/04/19 165 lb (74.8 kg)    Health Maintenance Due  Topic Date Due  . Hepatitis C Screening  Never done  . COVID-19 Vaccine (1) Never done  . HIV Screening  Never done  . PAP SMEAR-Modifier  Never done  . MAMMOGRAM  Never done  . COLONOSCOPY  Never done  . INFLUENZA VACCINE  01/05/2020    There are no preventive care reminders to display for this patient.  CBC Latest Ref Rng & Units 12/05/2019 11/06/2019 02/16/2019  WBC 4.0 - 10.5 K/uL 9.8 5.8 9.1  Hemoglobin 12.0 - 15.0 g/dL 04/18/2019 38.2 50.5  Hematocrit 36 - 46 % 38.1 41.4 38.7  Platelets 150 - 400 K/uL 256 263 283   CMP Latest Ref Rng & Units 12/05/2019 02/16/2019 06/27/2016  Glucose 70 - 99 mg/dL 425(Z) 563(O) 95  BUN 6 - 20 mg/dL 75(I) 17 11  Creatinine 0.44 - 1.00 mg/dL 4.33(I) 9.51 8.84  Sodium 135 - 145 mmol/L 139 137 133(L)  Potassium 3.5 - 5.1 mmol/L 4.4 4.4 3.3(L)  Chloride 98 - 111 mmol/L 109 106 101  CO2 22 - 32 mmol/L 21(L) 23 22  Calcium 8.9 - 10.3 mg/dL 1.6(S) 9.2 0.6(T)    No results found for: TSH Lab Results  Component Value Date   ANIONGAP 9 12/05/2019   No results found for: CHOL, HDL, LDLCALC, CHOLHDL No results found for: TRIG No results found for: HGBA1C    ASSESSMENT & PLAN:   Problem List Items Addressed This  Visit      Nervous and Auditory   Nonintractable generalized idiopathic epilepsy without status epilepticus (HCC) - Primary    Patient did not have any more episode of epilepsy she is being followed up by his neurologist.  In the   Rt occiput  She has a device and was placed on the right occipital region which controlled and he manages the epilepsy.  On the left side she has a device where she can  use magnetic stimulator device.  to control  onset of epilepsy.        Musculoskeletal and Integument   Sprain of xiphoid cartilage    Was advised to use hot water bottle massage to relieve the spasm of the xiphoid        Other   Epigastric pain    Patient complaining of pain in the epigastrium.  She has had her gallbladder removed she was not found to have any tenderness in the abdomen there is no tenderness in the back she does not have any rash in the abdominal area. No History of radiation the pain to the neck shoulder and arm, no sweating no nausea or vomiting.  she was found to be tender in the xiphoid process so I told her that her pain is most likely due to crampy xiphoid process which may be compatible with bursitis.  It should go away with rest and treatment.      Metabolic syndrome    - I encouraged the patient to lose weight.  - I educated them on making healthy dietary choices including eating more fruits and vegetables and less fried foods. - I encouraged the patient to exercise more, and educated on the benefits of exercise including weigh reduction           No orders of the defined types were placed in this encounter.   Follow-up: No follow-ups on file.    Dr. Woodroe Chen [employer] [address], [place], [place] [address]   By signing my name below, I, [employer], attest that this documentation has been prepared under the direction and in the presence of Corky Downs, MD. Electronically Signed: Corky Downs, MD 01/13/20, 6:43 PM   I  personally performed the services described in this documentation, which was SCRIBED in my presence. The recorded information has been reviewed and considered accurate. It has been edited as necessary during review. Corky Downs, MD

## 2020-01-13 NOTE — Assessment & Plan Note (Signed)
Patient complaining of pain in the epigastrium.  She has had her gallbladder removed she was not found to have any tenderness in the abdomen there is no tenderness in the back she does not have any rash in the abdominal area. No History of radiation the pain to the neck shoulder and arm, no sweating no nausea or vomiting.  she was found to be tender in the xiphoid process so I told her that her pain is most likely due to crampy xiphoid process which may be compatible with bursitis.  It should go away with rest and treatment.

## 2020-01-13 NOTE — Assessment & Plan Note (Signed)
Was advised to use hot water bottle massage to relieve the spasm of the xiphoid

## 2020-01-30 ENCOUNTER — Other Ambulatory Visit: Payer: Self-pay | Admitting: Internal Medicine

## 2020-03-02 ENCOUNTER — Other Ambulatory Visit: Payer: Self-pay | Admitting: *Deleted

## 2020-03-02 DIAGNOSIS — Z1231 Encounter for screening mammogram for malignant neoplasm of breast: Secondary | ICD-10-CM

## 2020-03-02 DIAGNOSIS — Z1239 Encounter for other screening for malignant neoplasm of breast: Secondary | ICD-10-CM

## 2020-03-24 ENCOUNTER — Ambulatory Visit
Admission: RE | Admit: 2020-03-24 | Discharge: 2020-03-24 | Disposition: A | Discharge status: Home / Self Care | Payer: Medicare Other | Source: Ambulatory Visit | Attending: Internal Medicine | Admitting: Internal Medicine

## 2020-03-24 ENCOUNTER — Encounter: Payer: Self-pay | Admitting: Internal Medicine

## 2020-03-24 ENCOUNTER — Ambulatory Visit (INDEPENDENT_AMBULATORY_CARE_PROVIDER_SITE_OTHER): Payer: Medicare Other | Admitting: Internal Medicine

## 2020-03-24 ENCOUNTER — Other Ambulatory Visit: Payer: Self-pay

## 2020-03-24 VITALS — BP 124/74 | HR 80 | Ht 64.0 in | Wt 184.7 lb

## 2020-03-24 DIAGNOSIS — E8881 Metabolic syndrome: Secondary | ICD-10-CM | POA: Diagnosis not present

## 2020-03-24 DIAGNOSIS — Z0000 Encounter for general adult medical examination without abnormal findings: Secondary | ICD-10-CM | POA: Insufficient documentation

## 2020-03-24 DIAGNOSIS — Z23 Encounter for immunization: Secondary | ICD-10-CM | POA: Insufficient documentation

## 2020-03-24 DIAGNOSIS — Z1231 Encounter for screening mammogram for malignant neoplasm of breast: Secondary | ICD-10-CM | POA: Diagnosis present

## 2020-03-24 DIAGNOSIS — G40309 Generalized idiopathic epilepsy and epileptic syndromes, not intractable, without status epilepticus: Secondary | ICD-10-CM

## 2020-03-24 DIAGNOSIS — I10 Essential (primary) hypertension: Secondary | ICD-10-CM | POA: Insufficient documentation

## 2020-03-24 NOTE — Progress Notes (Signed)
Established Patient Office Visit  Subjective:  Patient ID: Mandy Deleon, female    DOB: 06/27/1965  Age: 54 y.o. MRN: [id number]  CC:  Chief Complaint  Patient presents with  . Annual Exam    HPI  Mandy RakesMartha L Deleon presents for patient had 3 episode of epileptic fit in the last morning months, patient has a bilaterally flat-footed and has seen the podiatrist for that.  She denies any history of callus formation over both feet.  No chest pain no shortness of breath.  Patient has mammogram done today report is not available yet..  Patient has a PHQ study done today.  And she also got a flu shot today.  Patient has a neuropace device implanted in the head.  for epilepsy.in [place]/patient does not want to have a colonoscopy.  She has an eye exam.  She does not have any fall risk.  She does not drive.  Patient can walk without a stick.  Functional status is stable.    Past Medical History:  Diagnosis Date  . Acid reflux   . Adjustment disorder with depressed mood   . Depression   . Diverticulitis   . 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 Frequent falls    d/t seizures  . Gait instability    specifically w/ seizure  . 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 Seizures (HCC)   . Ulcerative colitis [facility](HCC)     Past Surgical History:  Procedure Laterality Date  . APPENDECTOMY    . CHOLECYSTECTOMY    . CRANIECTOMY / CRANIOTOMY FOR IMPLANTATION NEUROSTIMULATOR ELECTRODES  2011  . HYSTEROSCOPY WITH D & C Bilateral 11/08/2019   Procedure: DILATATION AND CURETTAGE /HYSTEROSCOPY;  Surgeon: Linzie CollinEvans, David James, MD;  Location: [facility] ORS;  Service: Gynecology;  Laterality: Bilateral;  . PARS PLANA VITRECTOMY Left 2021  . RNS     responsive neurostimulation for seizures. (brain electrodes)  . TONSILLECTOMY    . vagas nerve stimulator  2011    Family History  Problem Relation Age of Onset  . Breast  cancer Paternal Aunt     Social History   Socioeconomic History  . Marital status: Widowed    Spouse name: Not on file  . Number of children: Not on file  . Years of education: Not on file  . Highest education level: Not on file  Occupational History    Comment: disabled  Tobacco Use  . Smoking status: Former Smoker    Types: Cigarettes    Quit date: 1975    Years since quitting: 46.8  . Smokeless tobacco: Never Used  Vaping Use  . Vaping Use: Never used  Substance and Sexual Activity  . Alcohol use: No  . Drug use: No  . Sexual activity: Not Currently  Other Topics Concern  . Not on file  Social History Narrative   Patient lives alone, with her dog.  Her older brother who lives an hour away is very helpful for her.   He will bring her to the hospital but she is not sure who will stay with her after surgery.   Social Determinants of Health   Financial Resource Strain:   . Difficulty of Paying Living Expenses: Not on file  Food Insecurity:   . Worried About Programme researcher, broadcasting/film/videounning Out of Food in the Last Year: Not on file  . Ran Out of Food in the Last Year: Not on  file  Transportation Needs:   . Freight forwarder (Medical): Not on file  . Lack of Transportation (Non-Medical): Not on file  Physical Activity:   . Days of Exercise per Week: Not on file  . Minutes of Exercise per Session: Not on file  Stress:   . Feeling of Stress : Not on file  Social Connections:   . Frequency of Communication with Friends and Family: Not on file  . Frequency of Social Gatherings with Friends and Family: Not on file  . Attends Religious Services: Not on file  . Active Member of Clubs or Organizations: Not on file  . Attends Banker Meetings: Not on file  . Marital Status: Not on file  Intimate Partner Violence:   . Fear of Current or Ex-Partner: Not on file  . Emotionally Abused: Not on file  . Physically Abused: Not on file  . Sexually Abused: Not on file     Current  Outpatient Medications:  .  acetaminophen (TYLENOL) 500 MG tablet, Take 1,000 mg by mouth every 6 (six) hours as needed (pain.). , Disp: , Rfl:  .  Cenobamate (XCOPRI) 100 MG TABS, Take 150 mg by mouth daily. , Disp: , Rfl:  .  cetirizine (ZYRTEC) 10 MG tablet, Take 10 mg by mouth at bedtime. , Disp: , Rfl:  .  levETIRAcetam (KEPPRA) 750 MG tablet, Take 750 mg by mouth in the morning and at bedtime. , Disp: , Rfl:  .  Melatonin 5 MG CAPS, Take 5 mg by mouth at bedtime. , Disp: , Rfl:  .  Methsuximide (CELONTIN) 300 MG CAPS, Take 300 mg by mouth at bedtime. , Disp: , Rfl:  .  nitrofurantoin (MACRODANTIN) 50 MG capsule, TAKE 1 CAPSULE BY MOUTH NIGHTLY, Disp: 30 capsule, Rfl: 6 .  zonisamide (ZONEGRAN) 100 MG capsule, Take 100 mg by mouth in the morning, at noon, and at bedtime. , Disp: , Rfl:    Allergies  Allergen Reactions  . Avocado Anaphylaxis  . Aspirin Other (See Comments)    Aspirin and all aspirin products are not compatible with her seizure medications.  . Divalproex Sodium     Designed for genetic epilepsy, not encephalitic  . Hydroxyzine Other (See Comments)    Caused her to go into cluster seizures  . Lamotrigine Other (See Comments)    Designed for generic epilepsy, not encephalitic  . Morphine And Related Other (See Comments)    Made her feel like she was floating.  That is how she feels when having a seizure. It can cause me to go into a seizure  . Phenobarbital Other (See Comments)    Designed for genetic epilepsy, not encephalitic  . Phenytoin Sodium Extended Other (See Comments)    Designed for genetic epilepsy, not encephalitic  . Prednisone Other (See Comments)    Caused her to go into cluster seizures  . Zoloft [Sertraline] Other (See Comments)    Made seizures worse  . Adhesive [Tape] Other (See Comments)    Welt to skin. Rash PAPER TAPE IS OKAY  . Ativan [Lorazepam] Nausea And Vomiting  . Carbamazepine Other (See Comments)    Designed for genetic epilepsy,  not encephalitic  . Codeine Other (See Comments)    "makes me loopy"  . Iodine Rash    Iodine on the skin causes hives  . Montelukast Other (See Comments)    Patient was told that it was best that she NOT take this medication  . Topiramate Other (See  Comments)    Did not work for patient    ROS Review of Systems  Constitutional: Negative.  Negative for chills.  HENT: Negative.  Negative for facial swelling.   Eyes: Negative for pain.  Respiratory: Negative.  Negative for cough.   Cardiovascular: Negative.  Negative for chest pain.  Gastrointestinal: Negative.  Negative for constipation.  Endocrine: Negative.   Genitourinary: Negative.  Negative for flank pain and pelvic pain.  Musculoskeletal: Positive for myalgias. Negative for gait problem.  Skin: Negative.   Allergic/Immunologic: Negative.   Neurological: Negative.  Negative for syncope, speech difficulty and light-headedness.  Hematological: Negative.   Psychiatric/Behavioral: Negative.   All other systems reviewed and are negative.     Objective:    Physical Exam Vitals reviewed.  Constitutional:      Appearance: Normal appearance.  HENT:     Head: Normocephalic.     Comments: Patient has a neuropacdevice implanted on the left side of the head and she also has acute stimulating catheter on the right occipital region.    Mouth/Throat:     Mouth: Mucous membranes are dry.  Eyes:     Pupils: Pupils are equal, round, and reactive to light.  Neck:     Vascular: No carotid bruit.  Cardiovascular:     Rate and Rhythm: Normal rate and regular rhythm.     Pulses: Normal pulses.     Heart sounds: Normal heart sounds.  Pulmonary:     Effort: Pulmonary effort is normal.     Breath sounds: Normal breath sounds.  Abdominal:     General: Bowel sounds are normal.     Palpations: Abdomen is soft. There is no hepatomegaly, splenomegaly or mass.     Tenderness: There is no abdominal tenderness.     Hernia: No hernia is  present.  Musculoskeletal:        General: No tenderness.     Cervical back: Neck supple.     Right lower leg: No edema.     Left lower leg: No edema.  Skin:    Findings: No rash.  Neurological:     Mental Status: She is alert and oriented to person, place, and time.     Motor: No weakness.  Psychiatric:        Mood and Affect: Mood and affect normal.        Behavior: Behavior normal.     BP 124/74   Pulse 80   Ht 5\' 4"  (1.626 m)   Wt 184 lb 11.2 oz (83.8 kg)   BMI 31.70 kg/m  Wt Readings from Last 3 Encounters:  03/24/20 184 lb 11.2 oz (83.8 kg)  01/13/20 179 lb 3.2 oz (81.3 kg)  12/13/19 176 lb 12.8 oz (80.2 kg)     Health Maintenance Due  Topic Date Due  . Hepatitis C Screening  Never done  . COVID-19 Vaccine (1) Never done  . HIV Screening  Never done  . PAP SMEAR-Modifier  Never done  . MAMMOGRAM  Never done  . COLONOSCOPY  Never done    There are no preventive care reminders to display for this patient.  No results found for: TSH Lab Results  Component Value Date   WBC 9.8 12/05/2019   HGB 13.1 12/05/2019   HCT 38.1 12/05/2019   MCV 95.7 12/05/2019   PLT 256 12/05/2019   Lab Results  Component Value Date   NA 139 12/05/2019   K 4.4 12/05/2019   CO2 21 (L) 12/05/2019  GLUCOSE 104 (H) 12/05/2019   BUN 26 (H) 12/05/2019   CREATININE 1.35 (H) 12/05/2019   CALCIUM 8.7 (L) 12/05/2019   ANIONGAP 9 12/05/2019   No results found for: CHOL No results found for: HDL No results found for: LDLCALC No results found for: TRIG No results found for: CHOLHDL No results found for: QQIW9N    Assessment & Plan:   Problem List Items Addressed This Visit      Cardiovascular and Mediastinum   Essential hypertension    Patient blood pressure is stable. - I encouraged the patient to eat a low-sodium diet to help control blood pressure. - I encouraged the patient to live an active lifestyle and complete activities that increases heart rate to 85% target heart  rate at least 5 times per week for one hour.            Nervous and Auditory   Nonintractable generalized idiopathic epilepsy without status epilepticus [facility])    Patient is known to have epilepsy and is being followed up in [facility].  Recently she has about 2-3 epileptic fits and patient kept a record of that.        Other   Metabolic syndrome    - I encouraged the patient to lose weight.  - I educated them on making healthy dietary choices including eating more fruits and vegetables and less fried foods. - I encouraged the patient to exercise more, and educated on the benefits of exercise including weight loss, diabetes management, and hypertension management.        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  She is also known to have epilepsy which is being managed by medication she also has a vagus nerve stimulator on the left upper chest and neuro pace device in the back of the head which is being followed up in [facility].  Recently she had 2-3 episode of epilepsy which patient noted.  Patient also has a magnetic watch on the right arm which she wears and she can abolish the epileptic fits.         No orders of the defined types were placed in this encounter.   Follow-up: No follow-ups on file.    Corky Downs, MD

## 2020-03-24 NOTE — Assessment & Plan Note (Signed)
Patient was administered influenza shot today

## 2020-03-24 NOTE — Assessment & Plan Note (Signed)
Patient is known to have epilepsy and is being followed up in [facility].  Recently she has about 2-3 epileptic fits and patient kept a record of that.

## 2020-03-24 NOTE — Assessment & Plan Note (Signed)
-   I encouraged the patient to lose weight.  - I educated them on making healthy dietary choices including eating more fruits and vegetables and less fried foods. - I encouraged the patient to exercise more, and educated on the benefits of exercise including weight loss, diabetes management, and hypertension management.   

## 2020-03-24 NOTE — Assessment & Plan Note (Signed)
Patient blood pressure is stable. - I encouraged the patient to eat a low-sodium diet to help control blood pressure. - I encouraged the patient to live an active lifestyle and complete activities that increases heart rate to 85% target heart rate at least 5 times per week for one hour.

## 2020-03-24 NOTE — Assessment & Plan Note (Signed)
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  She is also known to have epilepsy which is being managed by medication she also has a vagus nerve stimulator on the left upper chest and neuro pace device in the back of the head which is being followed up in [facility].  Recently she had 2-3 episode of epilepsy which patient noted.  Patient also has a magnetic watch on the right arm which she wears and she can abolish the epileptic fits.

## 2020-03-25 ENCOUNTER — Other Ambulatory Visit: Payer: Self-pay | Admitting: *Deleted

## 2020-03-25 ENCOUNTER — Inpatient Hospital Stay
Admission: RE | Admit: 2020-03-25 | Discharge: 2020-03-25 | Disposition: A | Discharge status: Home / Self Care | Payer: Self-pay | Source: Ambulatory Visit | Attending: *Deleted | Admitting: *Deleted

## 2020-03-25 DIAGNOSIS — Z1231 Encounter for screening mammogram for malignant neoplasm of breast: Secondary | ICD-10-CM

## 2020-04-07 ENCOUNTER — Telehealth: Payer: Self-pay

## 2020-04-07 NOTE — Telephone Encounter (Signed)
Copied from CRM [id number]. Topic: Appointment Scheduling - Scheduling Inquiry for Clinic >> Apr 07, 2020 12:51 PM Dalphine Handing A wrote: Patient wants to know if Dr. Sherrie Mustache would be willing to take her on as a new patient. Best contact [phone]

## 2020-04-08 NOTE — Telephone Encounter (Signed)
Not currently accepting new patient, but Dr. Trey Paula, or Marcelino Duster could see her.

## 2020-04-08 NOTE — Telephone Encounter (Signed)
Please review. Thanks!  

## 2020-05-04 NOTE — Progress Notes (Signed)
New patient visit   Patient: Mandy Deleon   DOB: [birth year]   54 y.o. Female  MRN: [id number] Visit Date: 05/05/2020  Today's healthcare provider: Trey SailorsAdriana M Constantin Hillery, PA-C   Chief Complaint  Patient presents with  . New Patient (Initial Visit)  I,Mandy Deleon M Mandy Deleon,acting as a scribe for Trey Sailorsdriana M Nilay Mangrum, PA-C.,have documented all relevant documentation on the behalf of Trey Sailorsdriana M Rory Xiang, PA-C,as directed by  Trey SailorsAdriana M Debhora Titus, PA-C while in the presence of Trey SailorsAdriana M Cullen Lahaie, PA-C.  Subjective    Mandy Deleon is a 54 y.o. female who presents today as a new patient to establish care. Presents to establish care. [place] in [place] independently. Presents with brother Mandy RuizJohn. She is on disability for epilepsy.   HPI   [facility] was normal but there was a question of ulcerative colitis, patient reports she was treated at one point she had ulcerative colitis. [facility] recommended repeating colonoscopy in 5 years.   Mammogram 03/26/2020 normal and repeat in 2 years  PAP smear: none on file. She had a history of a D&C 11/2019 however no recent PAP smear.   Patient has chronic epilepsy, is on disability for this. [facility] and will continue to be followed by them. Currently on celontin, zonegran and keppra. She has a VNS and RNS.   She is currently on macrobid 50 mg daily for UTI prophylaxis.     Past Medical History:  Diagnosis Date  . Acid reflux   . Adjustment disorder with depressed mood   . Depression   . Diverticulitis   . 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 Frequent falls    d/t seizures  . Gait instability    specifically w/ seizure  . 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 Seizures (HCC)   . Ulcerative colitis [facility](HCC)    Past Surgical History:  Procedure Laterality Date  . APPENDECTOMY    . CHOLECYSTECTOMY    . CRANIECTOMY / CRANIOTOMY FOR  IMPLANTATION NEUROSTIMULATOR ELECTRODES  2011  . HYSTEROSCOPY WITH D & C Bilateral 11/08/2019   Procedure: DILATATION AND CURETTAGE /HYSTEROSCOPY;  Surgeon: Linzie CollinEvans, David James, MD;  Location: [facility] ORS;  Service: Gynecology;  Laterality: Bilateral;  . PARS PLANA VITRECTOMY Left 2021  . RNS     responsive neurostimulation for seizures. (brain electrodes)  . TONSILLECTOMY    . vagas nerve stimulator  2011   Family Status  Relation Name Status  . Emelda BrothersPat Aunt great (Not Specified)   Family History  Problem Relation Age of Onset  . Breast cancer Paternal Aunt    Social History   Socioeconomic History  . Marital status: Widowed    Spouse name: Not on file  . Number of children: Not on file  . Years of education: Not on file  . Highest education level: Not on file  Occupational History    Comment: disabled  Tobacco Use  . Smoking status: Former Smoker    Types: Cigarettes    Quit date: 08/19/1987    Years since quitting: 32.7  . Smokeless tobacco: Never Used  Vaping Use  . Vaping Use: Never used  Substance and Sexual Activity  . Alcohol use: No  . Drug use: No  . Sexual activity: Not Currently  Other Topics Concern  . Not on file  Social History Narrative   Patient lives alone, with her dog.  Her older  brother who lives an hour away is very helpful for her.   He will bring her to the hospital but she is not sure who will stay with her after surgery.   Social Determinants of Health   Financial Resource Strain:   . Difficulty of Paying Living Expenses: Not on file  Food Insecurity:   . Worried About Programme researcher, broadcasting/film/video in the Last Year: Not on file  . Ran Out of Food in the Last Year: Not on file  Transportation Needs:   . Lack of Transportation (Medical): Not on file  . Lack of Transportation (Non-Medical): Not on file  Physical Activity:   . Days of Exercise per Week: Not on file  . Minutes of Exercise per Session: Not on file  Stress:   . Feeling of Stress : Not on file    Social Connections:   . Frequency of Communication with Friends and Family: Not on file  . Frequency of Social Gatherings with Friends and Family: Not on file  . Attends Religious Services: Not on file  . Active Member of Clubs or Organizations: Not on file  . Attends Banker Meetings: Not on file  . Marital Status: Not on file   Outpatient Medications Prior to Visit  Medication Sig  . acetaminophen (TYLENOL) 500 MG tablet Take 1,000 mg by mouth every 6 (six) hours as needed (pain.).   Marland Kitchen Cenobamate (XCOPRI) 100 MG TABS Take 150 mg by mouth daily.   . cetirizine (ZYRTEC) 10 MG tablet Take 10 mg by mouth at bedtime.   . levETIRAcetam (KEPPRA) 750 MG tablet Take by mouth.  . Melatonin 5 MG CAPS Take 5 mg by mouth at bedtime.   . nitrofurantoin (MACRODANTIN) 50 MG capsule TAKE 1 CAPSULE BY MOUTH NIGHTLY  . zonisamide (ZONEGRAN) 100 MG capsule Take 100 mg by mouth in the morning, at noon, and at bedtime.   . Methsuximide (CELONTIN) 300 MG CAPS Take 300 mg by mouth at bedtime.  (Patient not taking: Reported on 05/05/2020)   No facility-administered medications prior to visit.   Allergies  Allergen Reactions  . Avocado Anaphylaxis  . Aspirin Other (See Comments)    Aspirin and all aspirin products are not compatible with her seizure medications.  . Divalproex Sodium     Designed for genetic epilepsy, not encephalitic  . Hydroxyzine Other (See Comments)    Caused her to go into cluster seizures  . Lamotrigine Other (See Comments)    Designed for generic epilepsy, not encephalitic  . Morphine And Related Other (See Comments)    Made her feel like she was floating.  That is how she feels when having a seizure. It can cause me to go into a seizure  . Phenobarbital Other (See Comments)    Designed for genetic epilepsy, not encephalitic  . Phenytoin Sodium Extended Other (See Comments)    Designed for genetic epilepsy, not encephalitic  . Prednisone Other (See Comments)     Caused her to go into cluster seizures  . Zoloft [Sertraline] Other (See Comments)    Made seizures worse  . Adhesive [Tape] Other (See Comments)    Welt to skin. Rash PAPER TAPE IS OKAY  . Ativan [Lorazepam] Nausea And Vomiting  . Carbamazepine Other (See Comments)    Designed for genetic epilepsy, not encephalitic  . Codeine Other (See Comments)    "makes me loopy"  . Iodine Rash    Iodine on the skin causes hives  . Montelukast  Other (See Comments)    Patient was told that it was best that she NOT take this medication  . Topiramate Other (See Comments)    Did not work for patient    Immunization History  Administered Date(s) Administered  . Influenza,inj,Quad PF,6+ Mos 03/24/2020    Health Maintenance  Topic Date Due  . Hepatitis C Screening  Never done  . COVID-19 Vaccine (1) Never done  . HIV Screening  Never done  . PAP SMEAR-Modifier  Never done  . COLONOSCOPY  Never done  . TETANUS/TDAP  01/03/2021  . MAMMOGRAM  03/24/2022  . INFLUENZA VACCINE  Completed    Patient Care Team: Maryella Shivers as PCP - General (Physician Assistant)  Review of Systems  Constitutional: Negative.   HENT: Negative.   Eyes: Negative.   Respiratory: Negative.   Cardiovascular: Negative.   Gastrointestinal: Negative.   Endocrine: Negative.   Genitourinary: Negative.   Musculoskeletal: Negative.   Skin: Negative.   Allergic/Immunologic: Negative.   Neurological: Negative.   Hematological: Negative.   Psychiatric/Behavioral: Negative.       Objective    BP (!) 104/45 (BP Location: Left Arm, Patient Position: Sitting, Cuff Size: Large)   Pulse 85   Temp 98.5 F (36.9 C) (Oral)   Ht 5\' 3"  (1.6 m)   Wt 184 lb 1.6 oz (83.5 kg)   SpO2 100%   BMI 32.61 kg/m  Physical Exam Constitutional:      Appearance: Normal appearance.  HENT:     Right Ear: Tympanic membrane, ear canal and external ear normal.     Left Ear: Tympanic membrane, ear canal and external ear  normal.  Cardiovascular:     Rate and Rhythm: Normal rate and regular rhythm.     Pulses: Normal pulses.     Heart sounds: Normal heart sounds.  Pulmonary:     Effort: Pulmonary effort is normal.     Breath sounds: Normal breath sounds.  Abdominal:     General: Abdomen is flat. Bowel sounds are normal.     Palpations: Abdomen is soft.  Skin:    General: Skin is warm and dry.  Neurological:     General: No focal deficit present.     Mental Status: She is alert and oriented to person, place, and time.  Psychiatric:        Mood and Affect: Mood normal.        Behavior: Behavior normal.      Depression Screen PHQ 2/9 Scores 03/24/2020  PHQ - 2 Score 0   No results found for any visits on 05/05/20.  Assessment & Plan     1. Essential hypertension   2. Concussion with loss of consciousness of 30 minutes or less, subsequent encounter   3. Nonintractable epilepsy without status epilepticus, unspecified epilepsy type (HCC)  - Comprehensive Metabolic Panel (CMET) - CBC with Differential - Vitamin D (25 hydroxy) - Levetiracetam level - Zonisamide level  4. Colon cancer screening  Due per [facility] recommendation. Patient refuses colonoscopy today.   5. Cervical cancer screening  Return for PAP.  6. Recurrent cystitis  Continue macrobid.  7. Vitamin D deficiency   8. Macrocytosis   9. Encounter for screening for HIV  - HIV antibody (with reflex)  10. Encounter for hepatitis C screening test for low risk patient  - Hepatitis C Antibody  11. Lipid screening  - Lipid Profile  12. Encounter for immunization  - [facility] Vaccine   No follow-ups on  file.     I, Trey Sailors, PA-C, have reviewed all documentation for this visit. The documentation on 05/05/20 for the exam, diagnosis, procedures, and orders are all accurate and complete.  The entirety of the information documented in the History of Present Illness, Review of Systems and Physical  Exam were personally obtained by me. Portions of this information were initially documented by [facility] and reviewed by me for thoroughness and accuracy.     Maryella Shivers  [facility] [phone] (phone) [phone] (fax)  [facility]

## 2020-05-05 ENCOUNTER — Ambulatory Visit (INDEPENDENT_AMBULATORY_CARE_PROVIDER_SITE_OTHER): Payer: Medicare Other | Admitting: Physician Assistant

## 2020-05-05 ENCOUNTER — Encounter: Payer: Self-pay | Admitting: Physician Assistant

## 2020-05-05 ENCOUNTER — Telehealth: Payer: Self-pay

## 2020-05-05 ENCOUNTER — Other Ambulatory Visit: Payer: Self-pay

## 2020-05-05 VITALS — BP 104/45 | HR 85 | Temp 98.5°F | Ht 63.0 in | Wt 184.1 lb

## 2020-05-05 DIAGNOSIS — G40909 Epilepsy, unspecified, not intractable, without status epilepticus: Secondary | ICD-10-CM | POA: Diagnosis not present

## 2020-05-05 DIAGNOSIS — I10 Essential (primary) hypertension: Secondary | ICD-10-CM | POA: Diagnosis not present

## 2020-05-05 DIAGNOSIS — S060X1D Concussion with loss of consciousness of 30 minutes or less, subsequent encounter: Secondary | ICD-10-CM

## 2020-05-05 DIAGNOSIS — E559 Vitamin D deficiency, unspecified: Secondary | ICD-10-CM

## 2020-05-05 DIAGNOSIS — Z23 Encounter for immunization: Secondary | ICD-10-CM | POA: Diagnosis not present

## 2020-05-05 DIAGNOSIS — N3090 Cystitis, unspecified without hematuria: Secondary | ICD-10-CM

## 2020-05-05 DIAGNOSIS — Z124 Encounter for screening for malignant neoplasm of cervix: Secondary | ICD-10-CM

## 2020-05-05 DIAGNOSIS — Z1211 Encounter for screening for malignant neoplasm of colon: Secondary | ICD-10-CM | POA: Diagnosis not present

## 2020-05-05 DIAGNOSIS — Z1159 Encounter for screening for other viral diseases: Secondary | ICD-10-CM

## 2020-05-05 DIAGNOSIS — Z114 Encounter for screening for human immunodeficiency virus [HIV]: Secondary | ICD-10-CM

## 2020-05-05 DIAGNOSIS — D7589 Other specified diseases of blood and blood-forming organs: Secondary | ICD-10-CM

## 2020-05-05 DIAGNOSIS — Z13220 Encounter for screening for lipoid disorders: Secondary | ICD-10-CM

## 2020-05-05 NOTE — Telephone Encounter (Signed)
Copied from CRM [id number]. Topic: General - Other >> May 05, 2020  1:25 PM Jaquita Rector A wrote: Reason for CRM: Patient called in to inform Marykay Lex that she does not want to see Dr. Juel Burrow anymore stated that she saw a Dr Chipper Herb for her eyes and that is whom she want to continue seeing. Can be reached for questions and comments at Ph# [phone] or [phone]

## 2020-05-05 NOTE — Patient Instructions (Signed)

## 2020-05-08 LAB — COMPREHENSIVE METABOLIC PANEL
ALT: 40 IU/L — ABNORMAL HIGH (ref 0–32)
AST: 29 IU/L (ref 0–40)
Albumin/Globulin Ratio: 2 (ref 1.2–2.2)
Albumin: 4.6 g/dL (ref 3.8–4.9)
Alkaline Phosphatase: 220 IU/L — ABNORMAL HIGH (ref 44–121)
BUN/Creatinine Ratio: 17 (ref 9–23)
BUN: 18 mg/dL (ref 6–24)
Bilirubin Total: <0.2 mg/dL (ref 0.0–1.2)
CO2: 21 mmol/L (ref 20–29)
Calcium: 9.2 mg/dL (ref 8.7–10.2)
Chloride: 106 mmol/L (ref 96–106)
Creatinine, Ser: 1.06 mg/dL — ABNORMAL HIGH (ref 0.57–1.00)
GFR calc Af Amer: 68 mL/min/{1.73_m2} (ref >59)
GFR calc non Af Amer: 59 mL/min/{1.73_m2} — ABNORMAL LOW (ref >59)
Globulin, Total: 2.3 g/dL (ref 1.5–4.5)
Glucose: 95 mg/dL (ref 65–99)
Potassium: 4.2 mmol/L (ref 3.5–5.2)
Sodium: 141 mmol/L (ref 134–144)
Total Protein: 6.9 g/dL (ref 6.0–8.5)

## 2020-05-08 LAB — CBC WITH DIFFERENTIAL/PLATELET
Basophils Absolute: 0 10*3/uL (ref 0.0–0.2)
Basos: 1 %
EOS (ABSOLUTE): 0 10*3/uL (ref 0.0–0.4)
Eos: 1 %
Hematocrit: 40.2 % (ref 34.0–46.6)
Hemoglobin: 14 g/dL (ref 11.1–15.9)
Immature Grans (Abs): 0.1 10*3/uL (ref 0.0–0.1)
Immature Granulocytes: 1 %
Lymphocytes Absolute: 1.7 10*3/uL (ref 0.7–3.1)
Lymphs: 27 %
MCH: 32.6 pg (ref 26.6–33.0)
MCHC: 34.8 g/dL (ref 31.5–35.7)
MCV: 94 fL (ref 79–97)
Monocytes Absolute: 0.6 10*3/uL (ref 0.1–0.9)
Monocytes: 9 %
Neutrophils Absolute: 3.9 10*3/uL (ref 1.4–7.0)
Neutrophils: 61 %
Platelets: 283 10*3/uL (ref 150–450)
RBC: 4.3 x10E6/uL (ref 3.77–5.28)
RDW: 13.1 % (ref 11.7–15.4)
WBC: 6.3 10*3/uL (ref 3.4–10.8)

## 2020-05-08 LAB — LIPID PANEL
Chol/HDL Ratio: 3.6 ratio (ref 0.0–4.4)
Cholesterol, Total: 251 mg/dL — ABNORMAL HIGH (ref 100–199)
HDL: 70 mg/dL (ref >39)
LDL Chol Calc (NIH): 159 mg/dL — ABNORMAL HIGH (ref 0–99)
Triglycerides: 125 mg/dL (ref 0–149)
VLDL Cholesterol Cal: 22 mg/dL (ref 5–40)

## 2020-05-08 LAB — HIV ANTIBODY (ROUTINE TESTING W REFLEX): HIV Screen 4th Generation wRfx: NONREACTIVE

## 2020-05-08 LAB — VITAMIN D 25 HYDROXY (VIT D DEFICIENCY, FRACTURES): Vit D, 25-Hydroxy: 22.9 ng/mL — ABNORMAL LOW (ref 30.0–100.0)

## 2020-05-08 LAB — HEPATITIS C ANTIBODY: Hep C Virus Ab: <0.1 s/co ratio (ref 0.0–0.9)

## 2020-05-08 LAB — ZONISAMIDE LEVEL: Zonisamide: 23.9 ug/mL (ref 10.0–40.0)

## 2020-05-08 LAB — LEVETIRACETAM LEVEL: Levetiracetam Lvl: 28.6 ug/mL (ref 10.0–40.0)

## 2020-05-11 ENCOUNTER — Telehealth: Payer: Self-pay

## 2020-05-11 NOTE — Telephone Encounter (Signed)
Called patient's brother [name]) and he was advised of patients lab results. John states he will come up to get a copy of patients labs to show her neurologist.

## 2020-05-11 NOTE — Telephone Encounter (Signed)
-----   Message from Trey Sailors, [place] sent at 05/08/2020 12:18 PM EST ----- Can we call patient's brother, [name], who is on the [facility] with results? Her labs are normal except for one elevated liver enzyme and alkaline phosphatase as well as low vitamin D. Since she no longer has a gallbladder, I think we can observe her liver enzymes. Her vitamin D is slightly low and she can take 1000 international units of vitamin D daily. Her seizure medicine levels appear to be in range but I would recommend her neurologist looks at it as well.

## 2020-05-21 ENCOUNTER — Telehealth: Payer: Self-pay

## 2020-05-21 NOTE — Telephone Encounter (Signed)
Copied from CRM [id number]. Topic: General - Other >> May 21, 2020  3:01 PM Mandy Deleon wrote: Reason for CRM: Pt would like to schedule a CPE appt but she wants to change her PCP/ Pt stated she just wants a MD and not a PA / Pt would like to get approval to change to Dr. Leonard Schwartz please advise

## 2020-05-21 NOTE — Telephone Encounter (Signed)
Please review. Thanks!  

## 2020-05-21 NOTE — Telephone Encounter (Signed)
That's fine. We can see if I have a CPE available in January or February, but it may be later than was scheduled with Adriana.

## 2020-06-15 ENCOUNTER — Emergency Department: Payer: Medicare Other

## 2020-06-15 ENCOUNTER — Encounter: Payer: Self-pay | Admitting: Emergency Medicine

## 2020-06-15 ENCOUNTER — Emergency Department
Admission: EM | Admit: 2020-06-15 | Discharge: 2020-06-16 | Disposition: A | Discharge status: Home / Self Care | Payer: Medicare Other | Attending: Emergency Medicine | Admitting: Emergency Medicine

## 2020-06-15 ENCOUNTER — Other Ambulatory Visit: Payer: Self-pay

## 2020-06-15 DIAGNOSIS — S161XXA Strain of muscle, fascia and tendon at neck level, initial encounter: Secondary | ICD-10-CM | POA: Diagnosis not present

## 2020-06-15 DIAGNOSIS — S39012A Strain of muscle, fascia and tendon of lower back, initial encounter: Secondary | ICD-10-CM | POA: Diagnosis not present

## 2020-06-15 DIAGNOSIS — Z87891 Personal history of nicotine dependence: Secondary | ICD-10-CM | POA: Diagnosis not present

## 2020-06-15 DIAGNOSIS — S169XXA Unspecified injury of muscle, fascia and tendon at neck level, initial encounter: Secondary | ICD-10-CM | POA: Diagnosis present

## 2020-06-15 DIAGNOSIS — I10 Essential (primary) hypertension: Secondary | ICD-10-CM | POA: Insufficient documentation

## 2020-06-15 DIAGNOSIS — Y92002 Bathroom of unspecified non-institutional (private) residence single-family (private) house as the place of occurrence of the external cause: Secondary | ICD-10-CM | POA: Insufficient documentation

## 2020-06-15 DIAGNOSIS — W01198A Fall on same level from slipping, tripping and stumbling with subsequent striking against other object, initial encounter: Secondary | ICD-10-CM | POA: Diagnosis not present

## 2020-06-15 DIAGNOSIS — R220 Localized swelling, mass and lump, head: Secondary | ICD-10-CM | POA: Diagnosis not present

## 2020-06-15 MED ORDER — METHOCARBAMOL 750 MG PO TABS: 750.0000 mg | ORAL_TABLET | Freq: Four times a day (QID) | ORAL | 0 refills | Status: AC | PRN

## 2020-06-15 MED ORDER — LIDOCAINE 4 % EX PTCH: 1.0000 | MEDICATED_PATCH | Freq: Two times a day (BID) | CUTANEOUS | 0 refills | Status: AC | PRN

## 2020-06-15 NOTE — ED Triage Notes (Signed)
Says she had bad seizure Saturday and it caused her to fall.  She has spoken to her neurologist and he told her to come here for xrays.  Her back and neck hurt and she basically says pain all over from the fall.

## 2020-06-15 NOTE — ED Provider Notes (Signed)
[facility] Emergency Department Provider Note  ____________________________________________   Event Date/Time   First MD Initiated Contact with Patient 06/15/20 1558     (approximate)  I have reviewed the triage vital signs and the nursing notes.   HISTORY  Chief Complaint Fall and Back Pain   HPI Mandy Deleon is a 55 y.o. female reports to the emergency room for evaluation of neck pain, back pain that has been present since the patient had a seizure on Saturday.  Patient has a longstanding history of seizures since diagnosis of viral encephalitis when she was in college, is managed [facility] and has implanted device as well as multiple medications for seizure control.  She reports having 2-3 seizures per month.  On most recent incident, the patient states that she had not eaten the day before or the day of due to construction work being done at the apartment next to her and her duplex, and she feels that this led to the precipitation of the seizure.  She reports that she fell backwards after getting out of the shower, hit her back on the floor and hit the back of her head on the tub.  She denies any new seizures since this.  She reports feeling mild swelling to the back of her head as well as severe neck pain and back pain.  She reports that she called her neurologist who requested that she report here for x-rays.  She has taken Tylenol extra strength yesterday without significant relief.  She denies any known COVID exposures.  She does report intermittent issues with bladder control over the last several months ever since having a D&C procedure for a polyp, reports no worsening of this in the last several days and with the onset of back pain.  She is not having persistent bladder control issues.  No fever.  She reports her pain to be an 8/10, worse with movement of the neck and back.        Past Medical History:  Diagnosis Date  . Acid reflux   . Adjustment disorder  with depressed mood   . Depression   . Diverticulitis   . 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 Frequent falls    d/t seizures  . Gait instability    specifically w/ seizure  . 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 Seizures (HCC)   . Ulcerative colitis [facility])     Patient Active Problem List   Diagnosis Date Noted  . Essential hypertension 03/24/2020  . Need for influenza vaccination 03/24/2020  . Encounter for preventive care 03/24/2020  . Epigastric pain 01/13/2020  . Metabolic syndrome 01/13/2020  . Sprain of xiphoid cartilage 01/13/2020  . Nonintractable generalized idiopathic epilepsy without status epilepticus (HCC) 12/13/2019  . Laceration and contusion of cerebral cortex (HCC) 12/13/2019  . Concussion wth loss of consciousness of 30 minutes or less 12/13/2019    Past Surgical History:  Procedure Laterality Date  . APPENDECTOMY    . CHOLECYSTECTOMY    . CRANIECTOMY / CRANIOTOMY FOR IMPLANTATION NEUROSTIMULATOR ELECTRODES  2011  . HYSTEROSCOPY WITH D & C Bilateral 11/08/2019   Procedure: DILATATION AND CURETTAGE /HYSTEROSCOPY;  Surgeon: Linzie Collin, MD;  Location: [facility] ORS;  Service: Gynecology;  Laterality: Bilateral;  . PARS PLANA VITRECTOMY Left 2021  . RNS     responsive neurostimulation for seizures. (brain electrodes)  . TONSILLECTOMY    .  vagas nerve stimulator  2011    Prior to Admission medications   Medication Sig Start Date End Date Taking? Authorizing Provider  Lidocaine (HM LIDOCAINE PATCH) 4 % PTCH Apply 1 patch topically every 12 (twelve) hours as needed for up to 15 days. 06/15/20 06/30/20 Yes Haleigh Desmith, Ruben Gottron, PA  methocarbamol (ROBAXIN-750) 750 MG tablet Take 1 tablet (750 mg total) by mouth 4 (four) times daily as needed for up to 10 days for muscle spasms. 06/15/20 06/25/20 Yes Lucy Chris, PA  acetaminophen (TYLENOL) 500 MG tablet Take 1,000 mg by  mouth every 6 (six) hours as needed (pain.).     [provider]  Cenobamate (XCOPRI) 100 MG TABS Take 150 mg by mouth daily.     [provider]  cetirizine (ZYRTEC) 10 MG tablet Take 10 mg by mouth at bedtime.  03/07/18   [provider]  levETIRAcetam (KEPPRA) 750 MG tablet Take by mouth. 04/23/20   [provider]  Melatonin 5 MG CAPS Take 5 mg by mouth at bedtime.     [provider]  Methsuximide (CELONTIN) 300 MG CAPS Take 300 mg by mouth at bedtime.  Patient not taking: Reported on 05/05/2020 05/13/19   [provider]  nitrofurantoin (MACRODANTIN) 50 MG capsule TAKE 1 CAPSULE BY MOUTH NIGHTLY 02/03/20   Corky Downs, MD  zonisamide (ZONEGRAN) 100 MG capsule Take 100 mg by mouth in the morning, at noon, and at bedtime.  05/13/19 05/07/20  [provider]    Allergies Avocado, Aspirin, Divalproex sodium, Hydroxyzine, Lamotrigine, Morphine and related, Phenobarbital, Phenytoin sodium extended, Prednisone, Zoloft [sertraline], Adhesive [tape], Ativan [lorazepam], Carbamazepine, Codeine, Iodine, Montelukast, and Topiramate  Family History  Problem Relation Age of Onset  . Breast cancer Paternal Aunt     Social History Social History   Tobacco Use  . Smoking status: Former Smoker    Types: Cigarettes    Quit date: 08/19/1987    Years since quitting: 32.8  . Smokeless tobacco: Never Used  Vaping Use  . Vaping Use: Never used  Substance Use Topics  . Alcohol use: No  . Drug use: No    Review of Systems Constitutional: No fever/chills Eyes: No visual changes. ENT: No sore throat. Cardiovascular: Denies chest pain. Respiratory: Denies shortness of breath. Gastrointestinal: No abdominal pain.  No nausea, no vomiting.  No diarrhea.  No constipation. Genitourinary: Negative for dysuria. Musculoskeletal: + Neck pain, + back pain  Skin: Negative for rash. Neurological: + Recent seizure, negative for headaches, focal  weakness or numbness.   ____________________________________________   PHYSICAL EXAM:  VITAL SIGNS: ED Triage Vitals [06/15/20 1556]  Enc Vitals Group     BP 113/76     Pulse Rate 78     Resp 16     Temp 98.4 F (36.9 C)     Temp Source Oral     SpO2 100 %     Weight 180 lb (81.6 kg)     Height 5\' 3"  (1.6 m)     Head Circumference      Peak Flow      Pain Score 8     Pain Loc      Pain Edu?      Excl. in GC?    Constitutional: Alert and oriented. Well appearing and in no acute distress. Eyes: Conjunctivae are normal. PERRL. EOMI. Head: Soft tissue swelling to the right occipital region, no bleeding or evidence of active hematoma Nose: No congestion/rhinnorhea. Mouth/Throat: Mucous membranes are  moist.  Oropharynx non-erythematous. Neck: No stridor.  Cervical spine reveals tenderness to the midline of the cervical spine diffusely, also tenderness to the bilateral paraspinals with active muscle spasm bilaterally.  Decreased range of motion secondary to pain. Cardiovascular: Normal rate, regular rhythm. Grossly normal heart sounds.  Good peripheral circulation. Respiratory: Normal respiratory effort.  No retractions. Lungs CTAB. Gastrointestinal: Soft and nontender. No distention. No abdominal bruits. No CVA tenderness. Musculoskeletal: There is tenderness to palpation at the midline of the thoracic spine diffusely, there is also tenderness to the right paraspinals of the lumbar region, no lumbar midline tenderness.  There are no step-off deformities or crepitus throughout the thoracic and lumbar spines. Neurologic:   No gross focal neurologic deficits are appreciated. No gait instability. Skin:  Skin is warm, dry and intact. No rash noted. Psychiatric: Mood and affect are normal. Speech and behavior are normal.  ____________________________________________  RADIOLOGY I, Lucy Chris, personally viewed and evaluated these images (plain radiographs) as part of my medical  decision making, as well as reviewing the written report by the radiologist.  ED provider interpretation: No acute fractures identified  Official radiology report(s): DG Thoracic Spine 2 View  Result Date: 06/15/2020 CLINICAL DATA:  55 year old female with fall. EXAM: THORACIC SPINE 2 VIEWS COMPARISON:  None. FINDINGS: There is no acute fracture or subluxation of the thoracic spine. The vertebral body heights are preserved. The visualized posterior elements are intact. The soft tissues are unremarkable. Partially visualized stimulator over the left chest wall. Right upper quadrant cholecystectomy clips. IMPRESSION: No acute/traumatic thoracic spine pathology. Electronically Signed   By: Elgie Collard M.D.   On: 06/15/2020 17:57   DG Lumbar Spine 2-3 Views  Result Date: 06/15/2020 CLINICAL DATA:  Back pain.  Fall, seizure. EXAM: LUMBAR SPINE - 2-3 VIEW COMPARISON:  None. FINDINGS: The alignment is maintained. Vertebral body heights are normal. There is no listhesis. The posterior elements are intact. Mild endplate spurring and minor disc space narrowing at L3-L4. The remaining disc spaces are preserved. No fracture. Sacroiliac joints are symmetric and normal. IMPRESSION: 1. No acute fracture of the lumbar spine. 2. Mild degenerative disc disease at L3-L4. Electronically Signed   By: Narda Rutherford M.D.   On: 06/15/2020 17:55   CT Head Wo Contrast  Result Date: 06/15/2020 CLINICAL DATA:  55 year old female with head trauma. EXAM: CT HEAD WITHOUT CONTRAST CT CERVICAL SPINE WITHOUT CONTRAST TECHNIQUE: Multidetector CT imaging of the head and cervical spine was performed following the standard protocol without intravenous contrast. Multiplanar CT image reconstructions of the cervical spine were also generated. COMPARISON:  CT dated 12/04/2019. FINDINGS: CT HEAD FINDINGS Evaluation of this exam is limited due to motion artifact. Evaluation is also severely limited due to streak artifact caused by right  calvarial plate. Brain: The ventricles and sulci are appropriate size for patient's age. Mild periventricular and deep white matter chronic microvascular ischemic changes noted. Bilateral temporal stimulators noted. No definite acute intracranial hemorrhage identified. Evaluation however is very limited due to factors mentioned above. There is no mass effect or midline shift. No extra-axial fluid collection. Vascular: No hyperdense vessel or unexpected calcification. Skull: No acute calvarial pathology. Postsurgical changes of right craniotomy. Sinuses/Orbits: No acute finding. Other: None CT CERVICAL SPINE FINDINGS Alignment: No acute subluxation. Skull base and vertebrae: No acute fracture. Soft tissues and spinal canal: No prevertebral fluid or swelling. No visible canal hematoma. Disc levels:  Multilevel degenerative changes. Upper chest: Negative. Other: Stimulator wire in the left neck base.  IMPRESSION: 1. Very limited evaluation of the brain due to artifact caused by calvarial plate. No definite acute intracranial hemorrhage identified. 2. No acute/traumatic cervical spine pathology. Electronically Signed   By: Elgie CollardArash  Radparvar M.D.   On: 06/15/2020 17:02   CT Cervical Spine Wo Contrast  Result Date: 06/15/2020 CLINICAL DATA:  55 year old female with head trauma. EXAM: CT HEAD WITHOUT CONTRAST CT CERVICAL SPINE WITHOUT CONTRAST TECHNIQUE: Multidetector CT imaging of the head and cervical spine was performed following the standard protocol without intravenous contrast. Multiplanar CT image reconstructions of the cervical spine were also generated. COMPARISON:  CT dated 12/04/2019. FINDINGS: CT HEAD FINDINGS Evaluation of this exam is limited due to motion artifact. Evaluation is also severely limited due to streak artifact caused by right calvarial plate. Brain: The ventricles and sulci are appropriate size for patient's age. Mild periventricular and deep white matter chronic microvascular ischemic changes  noted. Bilateral temporal stimulators noted. No definite acute intracranial hemorrhage identified. Evaluation however is very limited due to factors mentioned above. There is no mass effect or midline shift. No extra-axial fluid collection. Vascular: No hyperdense vessel or unexpected calcification. Skull: No acute calvarial pathology. Postsurgical changes of right craniotomy. Sinuses/Orbits: No acute finding. Other: None CT CERVICAL SPINE FINDINGS Alignment: No acute subluxation. Skull base and vertebrae: No acute fracture. Soft tissues and spinal canal: No prevertebral fluid or swelling. No visible canal hematoma. Disc levels:  Multilevel degenerative changes. Upper chest: Negative. Other: Stimulator wire in the left neck base. IMPRESSION: 1. Very limited evaluation of the brain due to artifact caused by calvarial plate. No definite acute intracranial hemorrhage identified. 2. No acute/traumatic cervical spine pathology. Electronically Signed   By: Elgie CollardArash  Radparvar M.D.   On: 06/15/2020 17:02    _____________________________________________   INITIAL IMPRESSION / ASSESSMENT AND PLAN / ED COURSE  As part of my medical decision making, I reviewed the following data within the electronic MEDICAL RECORD NUMBER Nursing notes reviewed and incorporated and Radiograph reviewed        Patient is a 55 year old female who presents to the emergency department for evaluation of neck and back pain status post seizure 2 days ago.  See HPI for further details.  In triage, the patient has normal vital signs.  On physical exam, there is some soft tissue swelling of the right occipital region, midline cervical tenderness as well as paraspinal tenderness, midline thoracic tenderness, and right sided lumbar paraspinal tenderness.  There are no neurologic deficits.  The patient has this longstanding history of seizures and they [facility].  She reports discussing this recent episode with her  neurologist who recommended referral here for evaluation of the neck and back pain with x-rays.  Given the patient's soft tissue swelling of the scalp, as well as diffuse neck pain with trauma, will order CT imaging of the head and neck as well as x-ray imaging of the thoracic and lumbar spines.  While there was artifact limiting the CT of the head, there was no significant abnormality identified and this appears similar to previous CT images obtained.  There are no acute fractures of the cervical, thoracic or lumbar spine.  Discussed pain management from this fall with the patient given reassuring exam and imaging.  The patient will take Tylenol.  In addition, she has been told in the past not to take anti-inflammatories with her seizure medications and thus will avoid that at this time.  Will prescribe Robaxin as this does not appear  to have any drug to drug reactions with her seizure medications.  Cautioned on drowsiness and risk for falls.  We will also prescribe lidocaine patches and discussed nonpharmacologic therapies for musculoskeletal pain including hot and cold compresses, etc.  The patient is amenable with this plan and she will follow-up with her neurologist.  Strict return precautions were discussed with her to return for any loss of bowel or bladder control, fevers or any other acute worsening.  The patient is amenable with this plan she stable this time for outpatient therapy.      ____________________________________________   FINAL CLINICAL IMPRESSION(S) / ED DIAGNOSES  Final diagnoses:  Strain of neck muscle, initial encounter  Strain of lumbar region, initial encounter     ED Discharge Orders         Ordered    methocarbamol (ROBAXIN-750) 750 MG tablet  4 times daily PRN        06/15/20 1813    Lidocaine (HM LIDOCAINE PATCH) 4 % PTCH  Every 12 hours PRN        06/15/20 1813          *Please note:  Toney RakesMartha L Carlo was evaluated in Emergency Department on 06/15/2020 for the  symptoms described in the history of present illness. She was evaluated in the context of the global COVID-19 pandemic, which necessitated consideration that the patient might be at risk for infection with the SARS-CoV-2 virus that causes COVID-19. Institutional protocols and algorithms that pertain to the evaluation of patients at risk for COVID-19 are in a state of rapid change based on information released by regulatory bodies including the CDC and federal and state organizations. These policies and algorithms were followed during the patient's care in the ED.  Some ED evaluations and interventions may be delayed as a result of limited staffing during and the pandemic.*   Note:  This document was prepared using Dragon voice recognition software and may include unintentional dictation errors.    Lucy ChrisRodgers, Lella Mullany J, PA 06/15/20 2042    Phineas SemenGoodman, [place], MD 06/15/20 2051

## 2020-06-23 ENCOUNTER — Encounter: Payer: Self-pay | Admitting: Physician Assistant

## 2020-06-30 ENCOUNTER — Ambulatory Visit: Payer: Medicare Other | Admitting: Internal Medicine

## 2020-07-21 ENCOUNTER — Other Ambulatory Visit: Payer: Self-pay

## 2020-07-21 ENCOUNTER — Encounter: Payer: Self-pay | Admitting: Obstetrics and Gynecology

## 2020-07-21 ENCOUNTER — Other Ambulatory Visit (HOSPITAL_COMMUNITY)
Admission: RE | Admit: 2020-07-21 | Discharge: 2020-07-21 | Disposition: A | Discharge status: Home / Self Care | Payer: Medicare Other | Source: Ambulatory Visit | Attending: Obstetrics and Gynecology | Admitting: Obstetrics and Gynecology

## 2020-07-21 ENCOUNTER — Other Ambulatory Visit: Payer: Self-pay | Admitting: Family Medicine

## 2020-07-21 ENCOUNTER — Ambulatory Visit (INDEPENDENT_AMBULATORY_CARE_PROVIDER_SITE_OTHER): Payer: Medicare Other | Admitting: Obstetrics and Gynecology

## 2020-07-21 VITALS — BP 101/70 | HR 88 | Ht 63.0 in | Wt 186.1 lb

## 2020-07-21 DIAGNOSIS — Z01419 Encounter for gynecological examination (general) (routine) without abnormal findings: Secondary | ICD-10-CM | POA: Diagnosis present

## 2020-07-21 DIAGNOSIS — N3941 Urge incontinence: Secondary | ICD-10-CM

## 2020-07-21 LAB — POCT URINALYSIS DIPSTICK
Blood, UA: NEGATIVE
Glucose, UA: NEGATIVE
Ketones, UA: NEGATIVE
Leukocytes, UA: NEGATIVE
Nitrite, UA: NEGATIVE
Odor: NEGATIVE
Protein, UA: NEGATIVE
Spec Grav, UA: >=1.03 — AB (ref 1.010–1.025)
Urobilinogen, UA: 0.2 E.U./dL
pH, UA: 6 (ref 5.0–8.0)

## 2020-07-21 MED ORDER — TOLTERODINE TARTRATE ER 2 MG PO CP24: 2.0000 mg | ORAL_CAPSULE | Freq: Every day | ORAL | 0 refills | Status: DC

## 2020-07-21 NOTE — Addendum Note (Signed)
Addended by: Marchelle Folks on: 07/21/2020 02:58 PM   Modules accepted: Orders

## 2020-07-21 NOTE — Progress Notes (Signed)
HPI:      [name] is a 55 y.o. G1P0010 who LMP was No LMP recorded. (Menstrual status: Perimenopausal).  Subjective:   She presents today for her annual examination.  She states that she is generally doing well.  She continues to have occasional seizures but her medication and implanted devices help to control them. She reports no further vaginal bleeding since her surgery. She does complain of onset within the last 6 months of some urine loss.  She attributes some of this to seizures but leaves that it occurs more often than that.  She states that she does lose fairly large amounts of urine when it occurs.  It is not associated with coughing laughing sneezing etc.    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 She does not have any pertinent problems on file. She  has a past surgical history that includes Cholecystectomy; vagas nerve stimulator (2011); Appendectomy; Tonsillectomy; Craniectomy / craniotomy for implantation neurostimulator electrodes (2011); Pars plana vitrectomy (Left, 2021); RNS; Hysteroscopy with D & C (Bilateral, 11/08/2019); and Eye surgery (10/2019). Her family history includes Breast cancer in her paternal aunt; Diabetes in her brother, maternal aunt, maternal grandmother, maternal uncle, and mother; Ovarian cancer in her mother. She  reports that she quit smoking about 32 years ago. Her smoking use included cigarettes. She has never used smokeless tobacco. She reports that she does not drink alcohol and does not use drugs. She has a current medication list which includes the following prescription(s):  acetaminophen, xcopri, cetirizine, levetiracetam, melatonin, celontin, nitrofurantoin, tolterodine, and zonisamide. She is allergic to avocado, aspirin, divalproex sodium, hydroxyzine, lamotrigine, morphine and related, phenobarbital, phenytoin sodium extended, prednisone, zoloft [sertraline], other, adhesive [tape], ativan [lorazepam], carbamazepine, codeine, iodine, montelukast, and topiramate.       Review of Systems:  Review of Systems  Constitutional: Denied constitutional symptoms, night sweats, recent illness, fatigue, fever, insomnia and weight loss.  Eyes: Denied eye symptoms, eye pain, photophobia, vision change and visual disturbance.  Ears/Nose/Throat/Neck: Denied ear, nose, throat or neck symptoms, hearing loss, nasal discharge, sinus congestion and sore throat.  Cardiovascular: Denied cardiovascular symptoms, arrhythmia, chest pain/pressure, edema, exercise intolerance, orthopnea and palpitations.  Respiratory: Denied pulmonary symptoms, asthma, pleuritic pain, productive sputum, cough, dyspnea and wheezing.  Gastrointestinal: Denied, gastro-esophageal reflux, melena, nausea and vomiting.  Genitourinary: See HPI for additional information.  Musculoskeletal: Denied musculoskeletal symptoms, stiffness, swelling, muscle weakness and myalgia.  Dermatologic: Denied dermatology symptoms, rash and scar.  Neurologic: See HPI for additional information.  Psychiatric: Denied psychiatric symptoms, anxiety and depression.  Endocrine: Denied endocrine symptoms including hot flashes and night sweats.   Meds:   Current Outpatient Medications on File Prior to Visit  Medication Sig Dispense Refill  . acetaminophen (TYLENOL) 500 MG tablet Take 1,000 mg by mouth every 6 (six) hours as needed (pain.).     Marland Kitchen Cenobamate (XCOPRI) 100 MG TABS Take 150 mg by mouth daily.     . cetirizine (ZYRTEC) 10 MG tablet Take 10 mg by mouth at bedtime.     . levETIRAcetam (KEPPRA) 750 MG tablet Take by mouth.     . Melatonin 5 MG CAPS  Take 5 mg by mouth at bedtime.     . Methsuximide (CELONTIN) 300 MG CAPS Take by mouth.    . nitrofurantoin (MACRODANTIN) 50 MG capsule TAKE 1 CAPSULE BY MOUTH NIGHTLY 30 capsule 6  . zonisamide (ZONEGRAN) 100 MG capsule Take 100 mg by mouth in the morning, at noon, and at bedtime.      No current facility-administered medications on file prior to visit.          Objective:     Vitals:   07/21/20 1408  BP: 101/70  Pulse: 88    Filed Weights   07/21/20 1408  Weight: 186 lb 1.6 oz (84.4 kg)              Physical examination General NAD, Conversant  HEENT Atraumatic; Op clear with mmm.  Normo-cephalic. Pupils reactive. Anicteric sclerae  Thyroid/Neck Smooth without nodularity or enlargement. Normal ROM.  Neck Supple.  Skin No rashes, lesions or ulceration. Normal palpated skin turgor. No nodularity.  Breasts: No masses or discharge.  Symmetric.  No axillary adenopathy.  Lungs: Clear to auscultation.No rales or wheezes. Normal Respiratory effort, no retractions.  Heart: NSR.  No murmurs or rubs appreciated. No periferal edema  Abdomen: Soft.  Non-tender.  No masses.  No HSM. No hernia  Extremities: Moves all appropriately.  Normal ROM for age. No lymphadenopathy.  Neuro: Oriented to PPT.  Normal mood. Normal affect.     Pelvic:   Vulva: Normal appearance.  No lesions.  Vagina: No lesions or abnormalities noted.  Support: Normal pelvic support.  Urethra No masses tenderness or scarring.  Meatus Normal size without lesions or prolapse.  Cervix: Normal appearance.  No lesions.  Anus: Normal exam.  No lesions.  Perineum: Normal exam.  No lesions.        Bimanual   Uterus: Normal size.  Non-tender.  Mobile.  AV.  Adnexae: No masses.  Non-tender to palpation.  Cul-de-sac: Negative for abnormality.      Assessment:    G1P0010 Patient Active Problem List   Diagnosis Date Noted  . Essential hypertension 03/24/2020  . Need for influenza  vaccination 03/24/2020  . Encounter for preventive care 03/24/2020  . Epigastric pain 01/13/2020  . Metabolic syndrome 01/13/2020  . Sprain of xiphoid cartilage 01/13/2020  . Nonintractable generalized idiopathic epilepsy without status epilepticus (HCC) 12/13/2019  . Laceration and contusion of cerebral cortex (HCC) 12/13/2019  . Concussion wth loss of consciousness of 30 minutes or less 12/13/2019     1. Well woman exam with routine gynecological exam   2. Urge incontinence of urine        Plan:            1.  Basic Screening Recommendations The basic screening recommendations for asymptomatic women were discussed with the patient during her visit.  The age-appropriate recommendations were discussed with her and the rational for the tests reviewed.  When I am informed by the patient that another primary care physician has previously obtained the age-appropriate tests and they are up-to-date, only outstanding tests are ordered and referrals given as necessary.  Abnormal results of tests will be discussed with her when all of her results are completed.  Routine preventative health maintenance measures emphasized: Exercise/Diet/Weight control, Tobacco Warnings, Alcohol/Substance use risks and Stress Management Pap performed-patient up-to-date with mammography. 2.  New onset of urge incontinence -we will try Detrol LA 4 to 6-week trial.  Discussed other strategies including frequent bladder emptying etc.  Orders No orders of  the defined types were placed in this encounter.    Meds ordered this encounter  Medications  . tolterodine (DETROL LA) 2 MG 24 hr capsule    Sig: Take 1 capsule (2 mg total) by mouth daily.    Dispense:  45 capsule    Refill:  0         F/U  Return in about 1 year (around 07/21/2021) for Annual Physical.  Elonda Husky, M.D. 07/21/2020 2:52 PM

## 2020-07-21 NOTE — Telephone Encounter (Signed)
   Notes to clinic:  medication filled by a historical provider Review for refill    Requested Prescriptions  Pending Prescriptions Disp Refills   cetirizine (ZYRTEC) 10 MG tablet [Pharmacy Med Name: CETIRIZINE HCL 10 MG TAB] 30 tablet     Sig: TAKE 1 TABLET BY MOUTH ONCE DAILY      Ear, Nose, and Throat:  Antihistamines Passed - 07/21/2020 11:47 AM      Passed - Valid encounter within last 12 months    Recent Outpatient Visits           2 months ago Essential hypertension   [facility] [name], [place], [place]

## 2020-07-22 ENCOUNTER — Telehealth: Payer: Self-pay

## 2020-07-22 NOTE — Telephone Encounter (Signed)
Can you prescribe something else.

## 2020-07-22 NOTE — Telephone Encounter (Signed)
The pharmacy called and stated that the Tolterodine sent in was needing a PA from her Medicare. The pharmacy also stated that this pt is taking a medicine that will counter act with this one. The pharmacy is requesting a call back.  [phone]

## 2020-07-23 ENCOUNTER — Other Ambulatory Visit: Payer: Self-pay | Admitting: Obstetrics and Gynecology

## 2020-07-23 DIAGNOSIS — N3941 Urge incontinence: Secondary | ICD-10-CM

## 2020-07-23 MED ORDER — OXYBUTYNIN CHLORIDE ER 10 MG PO TB24: 10.0000 mg | ORAL_TABLET | Freq: Every day | ORAL | 2 refills | Status: DC

## 2020-07-24 NOTE — Telephone Encounter (Signed)
Chris from [facility] called back to check and see if we wanted to send something else in for patient. He stated that he has been checking and Myrbetriq may work for the patient.

## 2020-07-24 NOTE — Telephone Encounter (Signed)
Called Chris at the pharmacy due to a sticky note being on my desk that medication conflict. Dr. Logan Bores had already sent in a new medication (Oxybutynin) to the pharmacy. When I spoke with Thayer Ohm at the pharmacy he stated that the new medication that was sent in also has a contraindication to the medication Zonisamide. Per Thayer Ohm both of these medications can cause hypothermia. I ask him if he could tell me of another similar medication that could be prescribed that does not have a contraindication and he stated that the doctor would have to come up with something.   Thayer Ohm did say that when he called and spoke with someone originally about the medication that the nurse told him that the Zonisamide was not on the medication list. I ask if he knew who he spoke with because I work with Dr. Logan Bores and it was not me. He stated that he did not know the name.   Dr. Logan Bores Would you like to prescribe something else for patient?

## 2020-07-27 ENCOUNTER — Telehealth: Payer: Self-pay | Admitting: Obstetrics and Gynecology

## 2020-07-27 LAB — CYTOLOGY - PAP
Comment: NEGATIVE
Diagnosis: NEGATIVE
High risk HPV: NEGATIVE

## 2020-07-27 NOTE — Telephone Encounter (Signed)
Chris from [facility] was calling about oxybiotin - drug interaction that he was wanting to discuss. He states that he originally called beginning of last week and has not received call back. Please advise.

## 2020-07-28 NOTE — Telephone Encounter (Signed)
Pt called in and stated that she would like a call back from the nurse. The pt is confused about what she needs to do and what meds she can take. I told her I will send a message and the nurse will call her. Please advise

## 2020-07-28 NOTE — Telephone Encounter (Signed)
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

## 2020-07-28 NOTE — Telephone Encounter (Signed)
Please see phone message from 07/28/20.. We do not need to prescribe new medication per patient.

## 2020-07-30 ENCOUNTER — Telehealth: Payer: Self-pay | Admitting: Obstetrics and Gynecology

## 2020-07-30 NOTE — Telephone Encounter (Signed)
New message   Pharmacy needs the CMA to come back regarding medication Oxybutynin 10 mg  .     Please advise

## 2020-08-10 NOTE — Telephone Encounter (Signed)
Yes this has been taken care of and documented on 07/22/2020.

## 2020-08-18 ENCOUNTER — Encounter: Payer: Self-pay | Admitting: Family Medicine

## 2020-08-18 ENCOUNTER — Ambulatory Visit (INDEPENDENT_AMBULATORY_CARE_PROVIDER_SITE_OTHER): Payer: Medicare Other | Admitting: Family Medicine

## 2020-08-18 ENCOUNTER — Other Ambulatory Visit: Payer: Self-pay

## 2020-08-18 VITALS — BP 97/70 | HR 80 | Temp 97.4°F | Ht 62.0 in | Wt 185.0 lb

## 2020-08-18 DIAGNOSIS — G40309 Generalized idiopathic epilepsy and epileptic syndromes, not intractable, without status epilepticus: Secondary | ICD-10-CM

## 2020-08-18 DIAGNOSIS — Z0000 Encounter for general adult medical examination without abnormal findings: Secondary | ICD-10-CM

## 2020-08-18 DIAGNOSIS — R748 Abnormal levels of other serum enzymes: Secondary | ICD-10-CM | POA: Diagnosis not present

## 2020-08-18 DIAGNOSIS — I10 Essential (primary) hypertension: Secondary | ICD-10-CM

## 2020-08-18 DIAGNOSIS — M249 Joint derangement, unspecified: Secondary | ICD-10-CM

## 2020-08-18 DIAGNOSIS — E559 Vitamin D deficiency, unspecified: Secondary | ICD-10-CM | POA: Diagnosis not present

## 2020-08-18 DIAGNOSIS — M159 Polyosteoarthritis, unspecified: Secondary | ICD-10-CM

## 2020-08-18 DIAGNOSIS — N3946 Mixed incontinence: Secondary | ICD-10-CM

## 2020-08-18 DIAGNOSIS — M8949 Other hypertrophic osteoarthropathy, multiple sites: Secondary | ICD-10-CM

## 2020-08-18 MED ORDER — MIRABEGRON ER 50 MG PO TB24: 50.0000 mg | ORAL_TABLET | Freq: Every day | ORAL | 2 refills | Status: DC

## 2020-08-18 NOTE — Patient Instructions (Addendum)
The CDC recommends two doses of Shingrix (the shingles vaccine) separated by 2 to 6 months for adults age 56 years and older. I recommend checking with your insurance plan regarding coverage for this vaccine.   Try Oxybutynin (not Detrol). If this is not filled or not covered, we can try Myrbetriq.  Preventive Care 94-70 Years Old, Female Preventive care refers to lifestyle choices and visits with your health care provider that can promote health and wellness. This includes:  A yearly physical exam. This is also called an annual wellness visit.  Regular dental and eye exams.  Immunizations.  Screening for certain conditions.  Healthy lifestyle choices, such as: ? Eating a healthy diet. ? Getting regular exercise. ? Not using drugs or products that contain nicotine and tobacco. ? Limiting alcohol use. What can I expect for my preventive care visit? Physical exam Your health care provider will check your:  Height and weight. These may be used to calculate your BMI (body mass index). BMI is a measurement that tells if you are at a healthy weight.  Heart rate and blood pressure.  Body temperature.  Skin for abnormal spots. Counseling Your health care provider may ask you questions about your:  Past medical problems.  Family's medical history.  Alcohol, tobacco, and drug use.  Emotional well-being.  Home life and relationship well-being.  Sexual activity.  Diet, exercise, and sleep habits.  Work and work Statistician.  Access to firearms.  Method of birth control.  Menstrual cycle.  Pregnancy history. What immunizations do I need? Vaccines are usually given at various ages, according to a schedule. Your health care provider will recommend vaccines for you based on your age, medical history, and lifestyle or other factors, such as travel or where you work.   What tests do I need? Blood tests  Lipid and cholesterol levels. These may be checked every 5 years, or  more often if you are over 65 years old.  Hepatitis C test.  Hepatitis B test. Screening  Lung cancer screening. You may have this screening every year starting at age 51 if you have a 30-pack-year history of smoking and currently smoke or have quit within the past 15 years.  Colorectal cancer screening. ? All adults should have this screening starting at age 38 and continuing until age 52. ? Your health care provider may recommend screening at age 108 if you are at increased risk. ? You will have tests every 1-10 years, depending on your results and the type of screening test.  Diabetes screening. ? This is done by checking your blood sugar (glucose) after you have not eaten for a while (fasting). ? You may have this done every 1-3 years.  Mammogram. ? This may be done every 1-2 years. ? Talk with your health care provider about when you should start having regular mammograms. This may depend on whether you have a family history of breast cancer.  BRCA-related cancer screening. This may be done if you have a family history of breast, ovarian, tubal, or peritoneal cancers.  Pelvic exam and Pap test. ? This may be done every 3 years starting at age 25. ? Starting at age 67, this may be done every 5 years if you have a Pap test in combination with an HPV test. Other tests  STD (sexually transmitted disease) testing, if you are at risk.  Bone density scan. This is done to screen for osteoporosis. You may have this scan if you are at high  risk for osteoporosis. Talk with your health care provider about your test results, treatment options, and if necessary, the need for more tests. Follow these instructions at home: Eating and drinking  Eat a diet that includes fresh fruits and vegetables, whole grains, lean protein, and low-fat dairy products.  Take vitamin and mineral supplements as recommended by your health care provider.  Do not drink alcohol if: ? Your health care provider  tells you not to drink. ? You are pregnant, may be pregnant, or are planning to become pregnant.  If you drink alcohol: ? Limit how much you have to 0-1 drink a day. ? Be aware of how much alcohol is in your drink. In the [place] one drink equals one 12 oz bottle of beer (355 mL), one 5 oz glass of wine (148 mL), or one 1 oz glass of hard liquor (44 mL).   Lifestyle  Take daily care of your teeth and gums. Brush your teeth every morning and night with fluoride toothpaste. Floss one time each day.  Stay active. Exercise for at least 30 minutes 5 or more days each week.  Do not use any products that contain nicotine or tobacco, such as cigarettes, e-cigarettes, and chewing tobacco. If you need help quitting, ask your health care provider.  Do not use drugs.  If you are sexually active, practice safe sex. Use a condom or other form of protection to prevent STIs (sexually transmitted infections).  If you do not wish to become pregnant, use a form of birth control. If you plan to become pregnant, see your health care provider for a prepregnancy visit.  If told by your health care provider, take low-dose aspirin daily starting at age 59.  Find healthy ways to cope with stress, such as: ? Meditation, yoga, or listening to music. ? Journaling. ? Talking to a trusted person. ? Spending time with friends and family. Safety  Always wear your seat belt while driving or riding in a vehicle.  Do not drive: ? If you have been drinking alcohol. Do not ride with someone who has been drinking. ? When you are tired or distracted. ? While texting.  Wear a helmet and other protective equipment during sports activities.  If you have firearms in your house, make sure you follow all gun safety procedures. What's next?  Visit your health care provider once a year for an annual wellness visit.  Ask your health care provider how often you should have your eyes and teeth checked.  Stay up to date on  all vaccines. This information is not intended to replace advice given to you by your health care provider. Make sure you discuss any questions you have with your health care provider. Document Revised: 02/25/2020 Document Reviewed: 02/01/2018 Elsevier Patient Education  2021 [employer].

## 2020-08-18 NOTE — Progress Notes (Signed)
Complete physical exam   Patient: Mandy Deleon   DOB: 1966-03-11   55 y.o. Female  MRN: [id number] Visit Date: 08/18/2020  Today's healthcare provider: Lavon Paganini, MD   Chief Complaint  Patient presents with   Annual Exam   Subjective    Mandy Deleon is a 55 y.o. female who presents today for a complete physical exam/AWV.  She reports consuming a general diet. Home exercise routine includes walking 1 hrs per day. She generally feels well. She reports sleeping fairly well. She does have additional problems to discuss today.  HPI  Pt would like to discuss incontinence and arthritis. Has not taken oxybutinin or detrol that were prescribed by GYN as her retail pharmacist told her these were not compatible with her AEDs.  She reports stress and urge incontinence and occasional leaking without a trigger.  Reports some stiffness and occasional pain in knees and elbows.  She is mostly concerned about the cracking and popping and being double-jointed.  Had UTI symptoms 2-3 days ago. Took an abx that was at the pharmacy and it helped.  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   Frequent falls    d/t seizures   Gait instability    specifically w/ seizure   History of viral encephalitis 1980   causing current seizure disorder   Seizures ([place])    Ulcerative colitis ([place])    Past Surgical History:  Procedure Laterality Date   APPENDECTOMY     CHOLECYSTECTOMY     CRANIECTOMY / CRANIOTOMY FOR IMPLANTATION NEUROSTIMULATOR ELECTRODES  2011   EYE SURGERY  10/2019   HYSTEROSCOPY WITH D & C Bilateral 11/08/2019   Procedure: DILATATION AND  CURETTAGE /HYSTEROSCOPY;  Surgeon: Harlin Heys, MD;  Location: [facility] OR[facility];  Service: Gynecology;  Laterality: Bilateral;   PARS PLANA VITRECTOMY Left 2021   RNS     responsive neurostimulation for seizures. (brain electrodes)   TONSILLECTOMY     vagas nerve stimulator  2011   Social History   Socioeconomic History   Marital status: Widowed    Spouse name: Not on file   Number of children: Not on file   Years of education: Not on file   Highest education level: Not on file  Occupational History    Comment: disabled  Tobacco Use   Smoking status: Former Smoker    Types: Cigarettes    Quit date: 08/19/1987    Years since quitting: 33.0   Smokeless tobacco: Never Used  Vaping Use   Vaping Use: Never used  Substance and Sexual Activity   Alcohol use: No   Drug use: No   Sexual activity: Not Currently  Other Topics Concern   Not on file  Social History Narrative   Patient lives alone, with her dog.  Her older brother who lives an hour away is very helpful for her.   He will bring her to the hospital but she is not sure who will stay with her after surgery.   Social Determinants of Health   Financial Resource Strain: Not on file  Food Insecurity: Not on file  Transportation  Needs: Not on file  Physical Activity: Not on file  Stress: Not on file  Social Connections: Not on file  Intimate Partner Violence: Not on file   Family Status  Relation Name Status   Pat Aunt great (Not Specified)   Mother  (Not Specified)   Brother  (Not Specified)   Mat Aunt  (Not Specified)   Mat Uncle  (Not Specified)   MGM  (Not Specified)   Neg Hx  (Not Specified)   Family History  Problem Relation Age of Onset   Breast cancer Paternal Aunt    Ovarian cancer Mother    Diabetes Mother    Diabetes Brother    Diabetes Maternal Aunt    Diabetes Maternal Uncle    Diabetes Maternal Grandmother    Colon cancer Neg Hx    Allergies  Allergen Reactions    Avocado Anaphylaxis   Aspirin Other (See Comments)    Aspirin and all aspirin products are not compatible with her seizure medications.   Divalproex Sodium     Designed for genetic epilepsy, not encephalitic   Hydroxyzine Other (See Comments)    Caused her to go into cluster seizures   Lamotrigine Other (See Comments)    Designed for generic epilepsy, not encephalitic   Morphine And Related Other (See Comments)    Made her feel like she was floating.  That is how she feels when having a seizure. 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 Rash PAPER TAPE IS OKAY   Ativan [Lorazepam] Nausea And Vomiting   Carbamazepine Other (See Comments)    Designed for genetic epilepsy, not encephalitic   Codeine Other (See Comments)    "makes me loopy"   Iodine Rash    Iodine on the skin causes hives   Montelukast Other (See Comments)    Patient was told that it was best that she NOT take this medication   Topiramate Other (See Comments)    Did not work for patient    Patient Care Team: Virginia Crews, MD as PCP - General (Family Medicine)   Medications: Outpatient Medications Prior to Visit  Medication Sig   acetaminophen (TYLENOL) 500 MG tablet Take 1,000 mg by mouth every 6 (six) hours as needed (pain.).    Cenobamate (XCOPRI) 100 MG TABS Take 150 mg by mouth daily.    cetirizine (ZYRTEC) 10 MG tablet TAKE 1 TABLET BY MOUTH ONCE DAILY   cholecalciferol (VITAMIN D3) 25 MCG (1000 UNIT) tablet Take 1,000 Units by mouth daily.   levETIRAcetam (KEPPRA) 750 MG tablet Take by  mouth.   Melatonin 5 MG CAPS Take 5 mg by mouth at bedtime.    Methsuximide (CELONTIN) 300 MG CAPS Take by mouth.   nitrofurantoin (MACRODANTIN) 50 MG capsule TAKE 1 CAPSULE BY MOUTH NIGHTLY   [DISCONTINUED] oxybutynin (DITROPAN XL) 10 MG 24 hr tablet Take 1 tablet (10 mg total) by mouth at bedtime.   [DISCONTINUED] tolterodine (DETROL LA) 2 MG 24 hr capsule Take 1 capsule (2 mg total) by mouth daily.  zonisamide (ZONEGRAN) 100 MG capsule Take 100 mg by mouth in the morning, at noon, and at bedtime.    No facility-administered medications prior to visit.    Review of Systems  Constitutional: Negative.   HENT: Negative.   Eyes: Negative.   Respiratory: Negative.   Cardiovascular: Negative.   Gastrointestinal: Negative.   Endocrine: Negative.   Genitourinary: Negative.   Musculoskeletal: Negative.   Skin: Negative.   Allergic/Immunologic: Negative.   Neurological: Negative.   Hematological: Negative.   Psychiatric/Behavioral: Negative.     Last CBC Lab Results  Component Value Date   WBC 6.3 05/05/2020   HGB 14.0 05/05/2020   HCT 40.2 05/05/2020   MCV 94 05/05/2020   MCH 32.6 05/05/2020   RDW 13.1 05/05/2020   PLT 283 96/78/9381   Last metabolic panel Lab Results  Component Value Date   GLUCOSE 95 05/05/2020   NA 141 05/05/2020   K 4.2 05/05/2020   CL 106 05/05/2020   CO2 21 05/05/2020   BUN 18 05/05/2020   CREATININE 1.06 (H) 05/05/2020   GFRNONAA 59 (L) 05/05/2020   GFRAA 68 05/05/2020   CALCIUM 9.2 05/05/2020   PROT 7.1 08/18/2020   ALBUMIN 4.7 08/18/2020   LABGLOB 2.3 05/05/2020   AGRATIO 2.0 05/05/2020   BILITOT 0.2 08/18/2020   ALKPHOS 164 (H) 08/18/2020   AST 24 08/18/2020   ALT 32 08/18/2020   ANIONGAP 9 12/05/2019   Last lipids Lab Results  Component Value Date   CHOL 251 (H) 05/05/2020   HDL 70 05/05/2020   LDLCALC 159 (H) 05/05/2020   TRIG 125 05/05/2020   CHOLHDL 3.6 05/05/2020   Last vitamin D Lab Results  Component Value Date    VD25OH 28.4 (L) 08/18/2020     Objective    BP 97/70 (BP Location: Right Arm, Patient Position: Sitting, Cuff Size: Large)    Pulse 80    Temp (!) 97.4 F (36.3 C) (Oral)    Ht 5' 2"  (1.575 m)    Wt 185 lb (83.9 kg)    SpO2 100%    BMI 33.84 kg/m  BP Readings from Last 3 Encounters:  08/18/20 97/70  07/21/20 101/70  06/15/20 113/76   Wt Readings from Last 3 Encounters:  08/18/20 185 lb (83.9 kg)  07/21/20 186 lb 1.6 oz (84.4 kg)  06/15/20 180 lb (81.6 kg)      Physical Exam Vitals reviewed.  Constitutional:      General: She is not in acute distress.    Appearance: Normal appearance. She is well-developed. She is not diaphoretic.  HENT:     Head: Normocephalic and atraumatic.     Right Ear: Tympanic membrane, ear canal and external ear normal.     Left Ear: Tympanic membrane, ear canal and external ear normal.     Nose: Nose normal.     Mouth/Throat:     Mouth: Mucous membranes are moist.     Pharynx: Oropharynx is clear. No oropharyngeal exudate.  Eyes:     General: No scleral icterus.    Conjunctiva/sclera: Conjunctivae normal.     Pupils: Pupils are equal, round, and reactive to light.  Neck:     Thyroid: No thyromegaly.  Cardiovascular:     Rate and Rhythm: Normal rate and regular rhythm.     Pulses: Normal pulses.     Heart sounds: Normal heart sounds. No murmur heard.   Pulmonary:     Effort: Pulmonary effort is normal. No respiratory distress.  Breath sounds: Normal breath sounds. No wheezing or rales.  Abdominal:     General: There is no distension.     Palpations: Abdomen is soft.     Tenderness: There is no abdominal tenderness.  Musculoskeletal:        General: No deformity.     Cervical back: Neck supple.     Right lower leg: No edema.     Left lower leg: No edema.     Comments: Hypermobile wrists, ankles, elbows.  Flat feet with inversion when standing.  Crepitus in bilateral knees.  No ligamental laxity.  No point tenderness over joint line.   Strength and sensation intact.  Lymphadenopathy:     Cervical: No cervical adenopathy.  Skin:    General: Skin is warm and dry.     Findings: No rash.  Neurological:     Mental Status: She is alert and oriented to person, place, and time. Mental status is at baseline.     Sensory: No sensory deficit.     Motor: No weakness.     Gait: Gait normal.  Psychiatric:        Mood and Affect: Mood normal.        Behavior: Behavior normal.        Thought Content: Thought content normal.       Last depression screening scores PHQ 2/9 Scores 08/18/2020 05/05/2020 03/24/2020  PHQ - 2 Score 1 0 0  PHQ- 9 Score 3 0 -   Last fall risk screening Fall Risk  08/18/2020  Falls in the past year? 0  Number falls in past yr: 0  Injury with Fall? 0  Risk for fall due to : No Fall Risks  Follow up Falls evaluation completed   Last Audit-C alcohol use screening Alcohol Use Disorder Test (AUDIT) 08/18/2020  1. How often do you have a drink containing alcohol? 0  2. How many drinks containing alcohol do you have on a typical day when you are drinking? 0  3. How often do you have six or more drinks on one occasion? 0  AUDIT-C Score 0  Alcohol Brief Interventions/Follow-up AUDIT Score <7 follow-up not indicated   A score of 3 or more in women, and 4 or more in men indicates increased risk for alcohol abuse, EXCEPT if all of the points are from question 1   Results for orders placed or performed in visit on 08/18/20  VITAMIN D 25 Hydroxy (Vit-D Deficiency, Fractures)  Result Value Ref Range   Vit D, 25-Hydroxy 28.4 (L) 30.0 - 100.0 ng/mL  Hepatic function panel  Result Value Ref Range   Total Protein 7.1 6.0 - 8.5 g/dL   Albumin 4.7 3.8 - 4.9 g/dL   Bilirubin Total 0.2 0.0 - 1.2 mg/dL   Bilirubin, Direct <0.10 0.00 - 0.40 mg/dL   Alkaline Phosphatase 164 (H) 44 - 121 IU/L   AST 24 0 - 40 IU/L   ALT 32 0 - 32 IU/L    Assessment & Plan    Routine Health Maintenance and Physical  Exam  Exercise Activities and Dietary recommendations Goals   None     Immunization History  Administered Date(s) Administered   Influenza, Seasonal, Injecte, Preservative Fre 04/20/2007, 03/19/2009, 03/04/2011, 03/07/2012, 03/26/2013   Influenza,inj,Quad PF,6+ Mos 03/20/2014, 03/13/2015, 03/14/2016, 03/10/2017, 03/07/2018, 03/29/2019, 03/24/2020   PFIZER(Purple Top)SARS-COV-2 Vaccination 08/28/2019, 09/18/2019, 05/05/2020   Tdap 01/19/2005, 01/04/2011    Health Maintenance  Topic Date Due   TETANUS/TDAP  01/03/2021   MAMMOGRAM  03/24/2022   COLONOSCOPY (Pts 45-83yr Insurance coverage will need to be confirmed)  04/22/2025   PAP SMEAR-Modifier  07/21/2025   INFLUENZA VACCINE  Completed   COVID-19 Vaccine  Completed   Hepatitis C Screening  Completed   HIV Screening  Completed   HPV VACCINES  Aged Out    Discussed health benefits of physical activity, and encouraged her to engage in regular exercise appropriate for her age and condition.  Problem List Items Addressed This Visit      Cardiovascular and Mediastinum   Essential hypertension    Well-controlled Lifestyle management-not on medications Reviewed last metabolic panel Continue to monitor        Nervous and Auditory   Nonintractable generalized idiopathic epilepsy without status epilepticus ([place]    No recent seizure activity [facility] No changes to AEDs        Musculoskeletal and Integument   Primary osteoarthritis involving multiple joints    Stable joints with occasional pain Discussed that we could x-ray them and see some arthritis, but this is low value given that we know she likely has some osteoarthritis in these joints Encouraged regular exercise with modifications to avoid painful positions Patient will try exercise bike and yoga NSAIDs as needed Ice after activity Given her significant flatfeet, I do encourage her to wear her orthotic insoles that she  has on a more regular basis        Other   Hypermobile joints    Hypermobile wrists, elbows, feet, ankles Discussed that this puts her at risk for injury Encouraged strengthening exercises Patient will try yoga      Mixed stress and urge urinary incontinence    Longstanding issue, but new to me Looked up her antiepileptic drugs and oxybutynin in [employer] and spoke to our clinical pharmacist It does not seem that there is a contraindication to oxybutynin in the situation She can try to pick this up from the pharmacy again, but I will also prescribe Myrbetriq if she is unable to get this      Relevant Medications   mirabegron ER (MYRBETRIQ) 50 MG TB24 tablet   Avitaminosis D    Has started vitamin D supplementation Recheck level today      Relevant Orders   VITAMIN D 25 Hydroxy (Vit-D Deficiency, Fractures) (Completed)   Elevated alkaline phosphatase level    Significant elevation of alk phos on last labs She is status post cholecystectomy and is asymptomatic Recheck alk phos today      Relevant Orders   Hepatic function panel (Completed)    Other Visit Diagnoses    Encounter for annual wellness visit (AWV) in Medicare patient    -  Primary     Encourage shingles vaccine  Return in about 6 months (around 02/18/2021).     I, ALavon Paganini MD, have reviewed all documentation for this visit. The documentation on 08/19/20 for the exam, diagnosis, procedures, and orders are all accurate and complete.   Herron Fero, ADionne Bucy MD, MPH [facility]

## 2020-08-19 DIAGNOSIS — M159 Polyosteoarthritis, unspecified: Secondary | ICD-10-CM | POA: Insufficient documentation

## 2020-08-19 DIAGNOSIS — M8949 Other hypertrophic osteoarthropathy, multiple sites: Secondary | ICD-10-CM | POA: Insufficient documentation

## 2020-08-19 DIAGNOSIS — N3946 Mixed incontinence: Secondary | ICD-10-CM | POA: Insufficient documentation

## 2020-08-19 DIAGNOSIS — M150 Primary generalized (osteo)arthritis: Secondary | ICD-10-CM | POA: Insufficient documentation

## 2020-08-19 DIAGNOSIS — E559 Vitamin D deficiency, unspecified: Secondary | ICD-10-CM | POA: Insufficient documentation

## 2020-08-19 DIAGNOSIS — R748 Abnormal levels of other serum enzymes: Secondary | ICD-10-CM | POA: Insufficient documentation

## 2020-08-19 DIAGNOSIS — M249 Joint derangement, unspecified: Secondary | ICD-10-CM | POA: Insufficient documentation

## 2020-08-19 LAB — HEPATIC FUNCTION PANEL
ALT: 32 IU/L (ref 0–32)
AST: 24 IU/L (ref 0–40)
Albumin: 4.7 g/dL (ref 3.8–4.9)
Alkaline Phosphatase: 164 IU/L — ABNORMAL HIGH (ref 44–121)
Bilirubin Total: 0.2 mg/dL (ref 0.0–1.2)
Bilirubin, Direct: <0.1 mg/dL (ref 0.00–0.40)
Total Protein: 7.1 g/dL (ref 6.0–8.5)

## 2020-08-19 LAB — VITAMIN D 25 HYDROXY (VIT D DEFICIENCY, FRACTURES): Vit D, 25-Hydroxy: 28.4 ng/mL — ABNORMAL LOW (ref 30.0–100.0)

## 2020-08-19 NOTE — Assessment & Plan Note (Signed)
Has started vitamin D supplementation Recheck level today

## 2020-08-19 NOTE — Assessment & Plan Note (Signed)
No recent seizure activity Followed and managed by [facility] neurology No changes to AEDs

## 2020-08-19 NOTE — Assessment & Plan Note (Signed)
Significant elevation of alk phos on last labs She is status post cholecystectomy and is asymptomatic Recheck alk phos today

## 2020-08-19 NOTE — Assessment & Plan Note (Signed)
Hypermobile wrists, elbows, feet, ankles Discussed that this puts her at risk for injury Encouraged strengthening exercises Patient will try yoga

## 2020-08-19 NOTE — Assessment & Plan Note (Signed)
Longstanding issue, but new to me Looked up her antiepileptic drugs and oxybutynin in [employer] and spoke to our clinical pharmacist It does not seem that there is a contraindication to oxybutynin in the situation She can try to pick this up from the pharmacy again, but I will also prescribe Myrbetriq if she is unable to get this

## 2020-08-19 NOTE — Assessment & Plan Note (Signed)
Stable joints with occasional pain Discussed that we could x-ray them and see some arthritis, but this is low value given that we know she likely has some osteoarthritis in these joints Encouraged regular exercise with modifications to avoid painful positions Patient will try exercise bike and yoga NSAIDs as needed Ice after activity Given her significant flatfeet, I do encourage her to wear her orthotic insoles that she has on a more regular basis

## 2020-08-19 NOTE — Assessment & Plan Note (Signed)
Well-controlled Lifestyle management-not on medications Reviewed last metabolic panel Continue to monitor

## 2020-08-20 ENCOUNTER — Telehealth: Payer: Self-pay

## 2020-08-20 NOTE — Telephone Encounter (Signed)
Per notes oxybutynin was not given to patient due to possible interactions with other medications. Please advise

## 2020-08-20 NOTE — Telephone Encounter (Signed)
Advised her to try oxybutynin first (this was Rx'd by Dr Logan Bores and should already be at the pharmacy

## 2020-08-20 NOTE — Telephone Encounter (Signed)
Yes this is false.  I looked it up and confirmed with our clinical pharmacist.  Pharmacy should fill it.

## 2020-08-20 NOTE — Telephone Encounter (Signed)
Copied from CRM [id number]. Topic: General - Other >> Aug 20, 2020  1:26 PM Pawlus, Maxine Glenn A wrote: Reason for CRM: Pts insurance will not cover mirabegron ER (MYRBETRIQ) 50 MG TB24 tablet - please advise if there is a generic option or an option that Medicare would cover.

## 2020-08-21 ENCOUNTER — Other Ambulatory Visit: Payer: Self-pay

## 2020-08-21 MED ORDER — OXYBUTYNIN CHLORIDE ER 10 MG PO TB24: 10.0000 mg | ORAL_TABLET | Freq: Every day | ORAL | 3 refills | Status: DC

## 2020-08-21 NOTE — Telephone Encounter (Signed)
Oxybutynin was D/C, pharmacy is requesting a new prescription. Please advise.

## 2020-08-21 NOTE — Telephone Encounter (Signed)
Ok to send refill - it's in past meds.  D/c Myrbetriq from med list

## 2020-08-25 NOTE — Telephone Encounter (Signed)
Do you mind discussing this with the retail pharmacist?  This is the one that we discussed and I ran through [employer] and did not find.

## 2020-08-25 NOTE — Telephone Encounter (Signed)
Thank you :)

## 2020-08-25 NOTE — Telephone Encounter (Signed)
Thayer Ohm the pharmacist on duty today at [facility] called and stated that the replaced Rx for QXYBUTYNIN has a serious interaction with the Pts RX for ZONISAMIDE / he would like a call to day from the clinic pharmacist or Dr. B so he can document what is advised to fill / please advise

## 2020-08-25 NOTE — Telephone Encounter (Signed)
Spoke with pharmacist Thayer Ohm. Interaction is a potential risk of heat illness with zonisamide and oxybutynin. Risk is more prevalent in pediatric populations, and less clinically relevant in adults.  Advised pharmacy per Dr. Beryle Flock prescription ok to fill. Pharmacist will reach out to patient to let her know and counsel her on risk of heat illness.

## 2020-09-01 ENCOUNTER — Encounter: Payer: Medicare Other | Admitting: Obstetrics and Gynecology

## 2020-09-09 ENCOUNTER — Other Ambulatory Visit: Payer: Self-pay | Admitting: Internal Medicine

## 2020-09-22 ENCOUNTER — Encounter: Payer: Self-pay | Admitting: Ophthalmology

## 2020-09-24 NOTE — Discharge Instructions (Signed)

## 2020-09-29 ENCOUNTER — Encounter: Payer: Self-pay | Admitting: Ophthalmology

## 2020-09-29 ENCOUNTER — Encounter
Admission: RE | Disposition: A | Discharge status: Home / Self Care | Payer: Self-pay | Source: Home / Self Care | Attending: Ophthalmology

## 2020-09-29 ENCOUNTER — Ambulatory Visit: Payer: Medicare Other | Admitting: Anesthesiology

## 2020-09-29 ENCOUNTER — Ambulatory Visit
Admission: RE | Admit: 2020-09-29 | Discharge: 2020-09-29 | Disposition: A | Discharge status: Home / Self Care | Payer: Medicare Other | Attending: Ophthalmology | Admitting: Ophthalmology

## 2020-09-29 ENCOUNTER — Other Ambulatory Visit: Payer: Self-pay

## 2020-09-29 DIAGNOSIS — Z885 Allergy status to narcotic agent status: Secondary | ICD-10-CM | POA: Insufficient documentation

## 2020-09-29 DIAGNOSIS — H2512 Age-related nuclear cataract, left eye: Secondary | ICD-10-CM | POA: Diagnosis not present

## 2020-09-29 DIAGNOSIS — Z87891 Personal history of nicotine dependence: Secondary | ICD-10-CM | POA: Diagnosis not present

## 2020-09-29 DIAGNOSIS — Z886 Allergy status to analgesic agent status: Secondary | ICD-10-CM | POA: Insufficient documentation

## 2020-09-29 DIAGNOSIS — Z888 Allergy status to other drugs, medicaments and biological substances status: Secondary | ICD-10-CM | POA: Insufficient documentation

## 2020-09-29 DIAGNOSIS — Z79899 Other long term (current) drug therapy: Secondary | ICD-10-CM | POA: Insufficient documentation

## 2020-09-29 DIAGNOSIS — Z91018 Allergy to other foods: Secondary | ICD-10-CM | POA: Insufficient documentation

## 2020-09-29 HISTORY — PX: CATARACT EXTRACTION W/PHACO: SHX586

## 2020-09-29 SURGERY — PHACOEMULSIFICATION, CATARACT, WITH IOL INSERTION
Anesthesia: Monitor Anesthesia Care | Site: Eye | Laterality: Left

## 2020-09-29 MED ORDER — EPINEPHRINE PF 1 MG/ML IJ SOLN
INTRAOCULAR | Status: DC | PRN
  Administered 2020-09-29: 69 mL via OPHTHALMIC

## 2020-09-29 MED ORDER — ONDANSETRON HCL 4 MG/2ML IJ SOLN: 4.0000 mg | Freq: Once | INTRAMUSCULAR | Status: DC | PRN

## 2020-09-29 MED ORDER — FENTANYL CITRATE (PF) 100 MCG/2ML IJ SOLN
INTRAMUSCULAR | Status: DC | PRN
  Administered 2020-09-29: 50 ug via INTRAVENOUS

## 2020-09-29 MED ORDER — NA CHONDROIT SULF-NA HYALURON 40-17 MG/ML IO SOLN
INTRAOCULAR | Status: DC | PRN
  Administered 2020-09-29: 1 mL via INTRAOCULAR

## 2020-09-29 MED ORDER — LIDOCAINE HCL (PF) 2 % IJ SOLN
INTRAOCULAR | Status: DC | PRN
  Administered 2020-09-29: 1 mL

## 2020-09-29 MED ORDER — ACETAMINOPHEN 160 MG/5ML PO SOLN: 325.0000 mg | ORAL | Status: DC | PRN

## 2020-09-29 MED ORDER — MOXIFLOXACIN HCL 0.5 % OP SOLN
OPHTHALMIC | Status: DC | PRN
  Administered 2020-09-29: 0.2 mL via OPHTHALMIC

## 2020-09-29 MED ORDER — ACETAMINOPHEN 325 MG PO TABS: 325.0000 mg | ORAL_TABLET | ORAL | Status: DC | PRN

## 2020-09-29 MED ORDER — BRIMONIDINE TARTRATE-TIMOLOL 0.2-0.5 % OP SOLN
OPHTHALMIC | Status: DC | PRN
  Administered 2020-09-29: 1 [drp] via OPHTHALMIC

## 2020-09-29 MED ORDER — MIDAZOLAM HCL 2 MG/2ML IJ SOLN
INTRAMUSCULAR | Status: DC | PRN
  Administered 2020-09-29: 2 mg via INTRAVENOUS

## 2020-09-29 MED ORDER — TETRACAINE HCL 0.5 % OP SOLN
1.0000 [drp] | OPHTHALMIC | Status: DC | PRN
  Administered 2020-09-29 (×3): 1 [drp] via OPHTHALMIC

## 2020-09-29 MED ORDER — ARMC OPHTHALMIC DILATING DROPS
1.0000 "application " | OPHTHALMIC | Status: DC | PRN
  Administered 2020-09-29 (×3): 1 via OPHTHALMIC

## 2020-09-29 SURGICAL SUPPLY — 17 items
CANNULA ANT/CHMB 27G (MISCELLANEOUS) ×2 IMPLANT
CANNULA ANT/CHMB 27GA (MISCELLANEOUS) ×4 IMPLANT
GLOVE SURG TRIUMPH 8.0 PF LTX (GLOVE) ×2 IMPLANT
GOWN STRL REUS W/ TWL LRG LVL3 (GOWN DISPOSABLE) ×2 IMPLANT
GOWN STRL REUS W/TWL LRG LVL3 (GOWN DISPOSABLE) ×4
LENS IOL TECNIS EYHANCE 13.0 (Intraocular Lens) ×1 IMPLANT
MARKER SKIN DUAL TIP RULER LAB (MISCELLANEOUS) ×2 IMPLANT
NDL FILTER BLUNT 18X1 1/2 (NEEDLE) ×1 IMPLANT
NEEDLE FILTER BLUNT 18X 1/2SAF (NEEDLE) ×1
NEEDLE FILTER BLUNT 18X1 1/2 (NEEDLE) ×1 IMPLANT
PACK EYE AFTER SURG (MISCELLANEOUS) ×2 IMPLANT
PACK OPTHALMIC (MISCELLANEOUS) ×2 IMPLANT
PACK PORFILIO (MISCELLANEOUS) ×2 IMPLANT
SYR 3ML LL SCALE MARK (SYRINGE) ×2 IMPLANT
SYR TB 1ML LUER SLIP (SYRINGE) ×2 IMPLANT
WATER STERILE IRR 250ML POUR (IV SOLUTION) ×2 IMPLANT
WIPE NON LINTING 3.25X3.25 (MISCELLANEOUS) ×2 IMPLANT

## 2020-09-29 NOTE — Anesthesia Postprocedure Evaluation (Signed)
Anesthesia Post Note  Patient: Mandy Deleon  Procedure(s) Performed: CATARACT EXTRACTION PHACO AND INTRAOCULAR LENS PLACEMENT (IOC) LEFT 11.62 01:05.0 (Left Eye)     Patient location during evaluation: PACU Anesthesia Type: MAC Level of consciousness: awake Pain management: pain level controlled Vital Signs Assessment: post-procedure vital signs reviewed and stable Respiratory status: respiratory function stable Cardiovascular status: stable Postop Assessment: no apparent nausea or vomiting Anesthetic complications: no   No complications documented.  Veda Canning

## 2020-09-29 NOTE — H&P (Signed)
[facility]   Primary Care Physician:  Erasmo Downer, MD Ophthalmologist: Dr. Druscilla Brownie  Pre-Procedure History & Physical: HPI:  Mandy Deleon is a 55 y.o. female here for cataract surgery.   Past Medical History:  Diagnosis Date  . Acid reflux   . Adjustment disorder with depressed mood   . Concussion wth loss of consciousness of 30 minutes or less 12/13/2019  . Depression   . Diverticulitis   . 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 Frequent falls    d/t seizures  . Gait instability    specifically w/ seizure  . 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 Seizures (HCC)   . Ulcerative colitis [facility])     Past Surgical History:  Procedure Laterality Date  . APPENDECTOMY    . CHOLECYSTECTOMY    . CRANIECTOMY / CRANIOTOMY FOR IMPLANTATION NEUROSTIMULATOR ELECTRODES  2011  . EYE SURGERY  10/2019  . HYSTEROSCOPY WITH D & C Bilateral 11/08/2019   Procedure: DILATATION AND CURETTAGE /HYSTEROSCOPY;  Surgeon: Linzie Collin, MD;  Location: [facility] ORS;  Service: Gynecology;  Laterality: Bilateral;  . PARS PLANA VITRECTOMY Left 2021  . RNS     responsive neurostimulation for seizures. (brain electrodes)  . TONSILLECTOMY    . vagas nerve stimulator  2011    Prior to Admission medications   Medication Sig Start Date End Date Taking? Authorizing Provider  acetaminophen (TYLENOL) 500 MG tablet Take 1,000 mg by mouth every 6 (six) hours as needed (pain.).    Yes [provider]  Cenobamate (XCOPRI) 100 MG TABS Take 300 mg by mouth at bedtime.   Yes [provider]  cetirizine (ZYRTEC) 10 MG tablet TAKE 1 TABLET BY MOUTH ONCE DAILY 07/21/20  Yes Bacigalupo, Marzella Schlein, MD  cholecalciferol (VITAMIN D3) 25 MCG (1000 UNIT) tablet Take 1,000 Units by mouth daily.   Yes [provider]  levETIRAcetam (KEPPRA) 750 MG tablet Take by mouth 2 (two) times daily. 04/23/20  Yes  [provider]  Melatonin 5 MG CAPS Take 5 mg by mouth at bedtime.    Yes [provider]  Methsuximide (CELONTIN) 300 MG CAPS Take by mouth.   Yes [provider]  nitrofurantoin (MACRODANTIN) 50 MG capsule TAKE 1 CAPSULE BY MOUTH NIGHTLY 09/09/20  Yes Masoud, Renda Rolls, MD  oxybutynin (DITROPAN-XL) 10 MG 24 hr tablet Take 1 tablet (10 mg total) by mouth at bedtime. 08/21/20  Yes Bacigalupo, Marzella Schlein, MD  zonisamide (ZONEGRAN) 100 MG capsule Take 100 mg by mouth in the morning, at noon, and at bedtime.  05/13/19 09/29/20 Yes [provider]    Allergies as of 08/17/2020 - Review Complete 07/21/2020  Allergen Reaction Noted  . Avocado Anaphylaxis 11/05/2019  . Aspirin Other (See Comments) 11/05/2019  . Divalproex sodium  11/05/2019  . Hydroxyzine Other (See Comments) 11/05/2019  . Lamotrigine Other (See Comments) 11/05/2019  . Morphine and related Other (See Comments) 06/01/2016  . Phenobarbital Other (See Comments) 11/05/2019  . Phenytoin sodium extended Other (See Comments) 11/05/2019  . Prednisone Other (See Comments) 11/05/2019  . Zoloft [sertraline] Other (See Comments) 11/05/2019  . Other  07/21/2020  . Adhesive [tape] Other (See Comments) 06/27/2016  . Ativan [lorazepam] Nausea And Vomiting 11/05/2019  . Carbamazepine Other (See Comments) 11/05/2019  . Codeine Other (See Comments) 08/21/2015  . Iodine Rash 06/27/2016  . Montelukast Other (See Comments) 11/05/2019  . Topiramate Other (  See Comments) 11/05/2019    Family History  Problem Relation Age of Onset  . Breast cancer Paternal Aunt   . Ovarian cancer Mother   . Diabetes Mother   . Diabetes Brother   . Diabetes Maternal Aunt   . Diabetes Maternal Uncle   . Diabetes Maternal Grandmother   . Colon cancer Neg Hx     Social History   Socioeconomic History  . Marital status: Widowed    Spouse name: Not on file  . Number of children: Not on file  . Years of education: Not on file  .  Highest education level: Not on file  Occupational History    Comment: disabled  Tobacco Use  . Smoking status: Former Smoker    Types: Cigarettes    Quit date: 08/19/1987    Years since quitting: 33.1  . Smokeless tobacco: Never Used  Vaping Use  . Vaping Use: Never used  Substance and Sexual Activity  . Alcohol use: No  . Drug use: No  . Sexual activity: Not Currently  Other Topics Concern  . Not on file  Social History Narrative   Patient lives alone, with her dog.  Her older brother who lives an hour away is very helpful for her.   He will bring her to the hospital but she is not sure who will stay with her after surgery.   Social Determinants of Health   Financial Resource Strain: Not on file  Food Insecurity: Not on file  Transportation Needs: Not on file  Physical Activity: Not on file  Stress: Not on file  Social Connections: Not on file  Intimate Partner Violence: Not on file    Review of Systems: See HPI, otherwise negative ROS  Physical Exam: Pulse 84   Temp 97.6 F (36.4 C) (Temporal)   Resp 20   Ht 5\' 3"  (1.6 m)   Wt 84 kg   SpO2 100%   BMI 32.79 kg/m  General:   Alert,  pleasant and cooperative in NAD Head:  Normocephalic and atraumatic. Respiratory:  Normal work of breathing. Cardiovascular:  RRR  Impression/Plan: Mandy Deleon is here for cataract surgery.  Risks, benefits, limitations, and alternatives regarding cataract surgery have been reviewed with the patient.  Questions have been answered.  All parties agreeable.   Toney Rakes, MD  09/29/2020, 10:14 AM

## 2020-09-29 NOTE — Transfer of Care (Signed)
Immediate Anesthesia Transfer of Care Note  Patient: Mandy Deleon  Procedure(s) Performed: CATARACT EXTRACTION PHACO AND INTRAOCULAR LENS PLACEMENT (IOC) LEFT 11.62 01:05.0 (Left Eye)  Patient Location: PACU  Anesthesia Type: MAC  Level of Consciousness: awake, alert  and patient cooperative  Airway and Oxygen Therapy: Patient Spontanous Breathing and Patient connected to supplemental oxygen  Post-op Assessment: Post-op Vital signs reviewed, Patient's Cardiovascular Status Stable, Respiratory Function Stable, Patent Airway and No signs of Nausea or vomiting  Post-op Vital Signs: Reviewed and stable  Complications: No complications documented.

## 2020-09-29 NOTE — Anesthesia Procedure Notes (Signed)
Procedure Name: MAC Date/Time: 09/29/2020 10:31 AM Performed by: Jeannene Patella, CRNA Pre-anesthesia Checklist: Patient identified, Emergency Drugs available, Suction available, Timeout performed and Patient being monitored Patient Re-evaluated:Patient Re-evaluated prior to induction Oxygen Delivery Method: Nasal cannula Placement Confirmation: positive ETCO2

## 2020-09-29 NOTE — Anesthesia Preprocedure Evaluation (Signed)
Anesthesia Evaluation  Patient identified by MRN, date of birth, ID band Patient awake    Reviewed: Allergy & Precautions, NPO status   History of Anesthesia Complications Negative for: history of anesthetic complications  Airway Mallampati: II   Neck ROM: Full    Dental no notable dental hx.    Pulmonary former smoker,    breath sounds clear to auscultation- rhonchi (-) wheezing      Cardiovascular Exercise Tolerance: Good hypertension,  Rhythm:Regular Rate:Normal - Systolic murmurs and - Diastolic murmurs    Neuro/Psych Seizures -,  PSYCHIATRIC DISORDERS Depression    GI/Hepatic PUD, GERD  ,Ulcerative colitis   Endo/Other  Obesity  Renal/GU      Musculoskeletal  (+) Arthritis ,   Abdominal (+) - obese,   Peds  Hematology   Anesthesia Other Findings   Reproductive/Obstetrics                             Anesthesia Physical  Anesthesia Plan  ASA: III  Anesthesia Plan: MAC   Post-op Pain Management:    Induction: Intravenous  PONV Risk Score and Plan: 2 and Midazolam and Treatment may vary due to age or medical condition  Airway Management Planned: Nasal Cannula and Natural Airway  Additional Equipment:   Intra-op Plan:   Post-operative Plan:   Informed Consent: I have reviewed the patients History and Physical, chart, labs and discussed the procedure including the risks, benefits and alternatives for the proposed anesthesia with the patient or authorized representative who has indicated his/her understanding and acceptance.       Plan Discussed with: CRNA and Anesthesiologist  Anesthesia Plan Comments:         Anesthesia Quick Evaluation

## 2020-09-29 NOTE — Op Note (Signed)
PREOPERATIVE DIAGNOSIS:  Nuclear sclerotic cataract of the left eye.   POSTOPERATIVE DIAGNOSIS:  Nuclear sclerotic cataract of the left eye.   OPERATIVE PROCEDURE:@   SURGEON:  Galen Manila, MD.   ANESTHESIA:  Anesthesiologist: Jola Babinski, MD CRNA: Jinny Blossom, CRNA  1.      Managed anesthesia care. 2.     0.88ml of Shugarcaine was instilled following the paracentesis   COMPLICATIONS:  None.   TECHNIQUE:   Stop and chop   DESCRIPTION OF PROCEDURE:  The patient was examined and consented in the preoperative holding area where the aforementioned topical anesthesia was applied to the left eye and then brought back to the Operating Room where the left eye was prepped and draped in the usual sterile ophthalmic fashion and a lid speculum was placed. A paracentesis was created with the side port blade and the anterior chamber was filled with viscoelastic. A near clear corneal incision was performed with the steel keratome. A continuous curvilinear capsulorrhexis was performed with a cystotome followed by the capsulorrhexis forceps. Hydrodissection and hydrodelineation were carried out with BSS on a blunt cannula. The lens was removed in a stop and chop  technique and the remaining cortical material was removed with the irrigation-aspiration handpiece. The capsular bag was inflated with viscoelastic and the Technis [id number] lens was placed in the capsular bag without complication. The remaining viscoelastic was removed from the eye with the irrigation-aspiration handpiece. The wounds were hydrated. The anterior chamber was flushed with BSS and the eye was inflated to physiologic pressure. 0.64ml Vigamox was placed in the anterior chamber. The wounds were found to be water tight. The eye was dressed with Combigan. The patient was given protective glasses to wear throughout the day and a shield with which to sleep tonight. The patient was also given drops with which to begin a drop regimen today  and will follow-up with me in one day. Implant Name Type Inv. Item Serial No. Manufacturer Lot No. LRB No. Used Action  LENS IOL TECNIS EYHANCE 13.0 - [id number] Intraocular Lens LENS IOL TECNIS EYHANCE 13.0 [id number] JOHNSON   Left 1 Implanted    Procedure(s): CATARACT EXTRACTION PHACO AND INTRAOCULAR LENS PLACEMENT (IOC) LEFT 11.62 01:05.0 (Left)  Electronically signed: Galen Manila 09/29/2020 10:48 AM

## 2020-10-06 ENCOUNTER — Other Ambulatory Visit: Payer: Self-pay

## 2020-10-06 ENCOUNTER — Encounter: Payer: Self-pay | Admitting: Ophthalmology

## 2020-10-08 NOTE — Discharge Instructions (Signed)

## 2020-10-13 ENCOUNTER — Encounter: Payer: Self-pay | Admitting: Ophthalmology

## 2020-10-13 ENCOUNTER — Ambulatory Visit: Payer: Medicare Other | Admitting: Anesthesiology

## 2020-10-13 ENCOUNTER — Ambulatory Visit
Admission: RE | Admit: 2020-10-13 | Discharge: 2020-10-13 | Disposition: A | Discharge status: Home / Self Care | Payer: Medicare Other | Attending: Ophthalmology | Admitting: Ophthalmology

## 2020-10-13 ENCOUNTER — Encounter
Admission: RE | Disposition: A | Discharge status: Home / Self Care | Payer: Self-pay | Source: Home / Self Care | Attending: Ophthalmology

## 2020-10-13 ENCOUNTER — Other Ambulatory Visit: Payer: Self-pay

## 2020-10-13 DIAGNOSIS — Z885 Allergy status to narcotic agent status: Secondary | ICD-10-CM | POA: Diagnosis not present

## 2020-10-13 DIAGNOSIS — Z961 Presence of intraocular lens: Secondary | ICD-10-CM | POA: Diagnosis not present

## 2020-10-13 DIAGNOSIS — H2511 Age-related nuclear cataract, right eye: Secondary | ICD-10-CM | POA: Insufficient documentation

## 2020-10-13 DIAGNOSIS — Z91018 Allergy to other foods: Secondary | ICD-10-CM | POA: Insufficient documentation

## 2020-10-13 DIAGNOSIS — Z87891 Personal history of nicotine dependence: Secondary | ICD-10-CM | POA: Diagnosis not present

## 2020-10-13 DIAGNOSIS — Z886 Allergy status to analgesic agent status: Secondary | ICD-10-CM | POA: Insufficient documentation

## 2020-10-13 DIAGNOSIS — Z79899 Other long term (current) drug therapy: Secondary | ICD-10-CM | POA: Insufficient documentation

## 2020-10-13 DIAGNOSIS — Z87892 Personal history of anaphylaxis: Secondary | ICD-10-CM | POA: Diagnosis not present

## 2020-10-13 DIAGNOSIS — Z888 Allergy status to other drugs, medicaments and biological substances status: Secondary | ICD-10-CM | POA: Diagnosis not present

## 2020-10-13 DIAGNOSIS — Z9842 Cataract extraction status, left eye: Secondary | ICD-10-CM | POA: Insufficient documentation

## 2020-10-13 HISTORY — PX: CATARACT EXTRACTION W/PHACO: SHX586

## 2020-10-13 SURGERY — PHACOEMULSIFICATION, CATARACT, WITH IOL INSERTION
Anesthesia: Monitor Anesthesia Care | Site: Eye | Laterality: Right

## 2020-10-13 MED ORDER — ARMC OPHTHALMIC DILATING DROPS
1.0000 "application " | OPHTHALMIC | Status: DC | PRN
  Administered 2020-10-13 (×3): 1 via OPHTHALMIC

## 2020-10-13 MED ORDER — FENTANYL CITRATE (PF) 100 MCG/2ML IJ SOLN
INTRAMUSCULAR | Status: DC | PRN
  Administered 2020-10-13: 100 ug via INTRAVENOUS

## 2020-10-13 MED ORDER — LACTATED RINGERS IV SOLN: INTRAVENOUS | Status: DC

## 2020-10-13 MED ORDER — MOXIFLOXACIN HCL 0.5 % OP SOLN
OPHTHALMIC | Status: DC | PRN
  Administered 2020-10-13: 0.2 mL via OPHTHALMIC

## 2020-10-13 MED ORDER — ONDANSETRON HCL 4 MG/2ML IJ SOLN: 4.0000 mg | Freq: Once | INTRAMUSCULAR | Status: DC | PRN

## 2020-10-13 MED ORDER — TETRACAINE HCL 0.5 % OP SOLN
1.0000 [drp] | OPHTHALMIC | Status: DC | PRN
  Administered 2020-10-13 (×3): 1 [drp] via OPHTHALMIC

## 2020-10-13 MED ORDER — LIDOCAINE HCL (PF) 2 % IJ SOLN
INTRAOCULAR | Status: DC | PRN
  Administered 2020-10-13: 1 mL via INTRAMUSCULAR

## 2020-10-13 MED ORDER — MIDAZOLAM HCL 2 MG/2ML IJ SOLN
INTRAMUSCULAR | Status: DC | PRN
  Administered 2020-10-13: 2 mg via INTRAVENOUS

## 2020-10-13 MED ORDER — BRIMONIDINE TARTRATE-TIMOLOL 0.2-0.5 % OP SOLN
OPHTHALMIC | Status: DC | PRN
  Administered 2020-10-13: 1 [drp] via OPHTHALMIC

## 2020-10-13 MED ORDER — NA CHONDROIT SULF-NA HYALURON 40-17 MG/ML IO SOLN
INTRAOCULAR | Status: DC | PRN
  Administered 2020-10-13: 1 mL via INTRAOCULAR

## 2020-10-13 MED ORDER — EPINEPHRINE PF 1 MG/ML IJ SOLN
INTRAOCULAR | Status: DC | PRN
  Administered 2020-10-13: 42 mL via OPHTHALMIC

## 2020-10-13 MED ORDER — ACETAMINOPHEN 10 MG/ML IV SOLN: 1000.0000 mg | Freq: Once | INTRAVENOUS | Status: DC | PRN

## 2020-10-13 SURGICAL SUPPLY — 18 items
CANNULA ANT/CHMB 27GA (MISCELLANEOUS) ×4 IMPLANT
GLOVE SURG LX 8.0 MICRO (GLOVE) ×1
GLOVE SURG LX STRL 8.0 MICRO (GLOVE) ×1 IMPLANT
GLOVE SURG TRIUMPH 8.0 PF LTX (GLOVE) ×2 IMPLANT
GOWN STRL REUS W/ TWL LRG LVL3 (GOWN DISPOSABLE) ×2 IMPLANT
GOWN STRL REUS W/TWL LRG LVL3 (GOWN DISPOSABLE) ×4
LENS IOL TECNIS EYHANCE 12.5 (Intraocular Lens) ×1 IMPLANT
MARKER SKIN DUAL TIP RULER LAB (MISCELLANEOUS) ×2 IMPLANT
NDL FILTER BLUNT 18X1 1/2 (NEEDLE) ×1 IMPLANT
NEEDLE FILTER BLUNT 18X 1/2SAF (NEEDLE) ×1
NEEDLE FILTER BLUNT 18X1 1/2 (NEEDLE) ×1 IMPLANT
PACK EYE AFTER SURG (MISCELLANEOUS) ×2 IMPLANT
PACK OPTHALMIC (MISCELLANEOUS) ×2 IMPLANT
PACK PORFILIO (MISCELLANEOUS) ×2 IMPLANT
SYR 3ML LL SCALE MARK (SYRINGE) ×2 IMPLANT
SYR TB 1ML LUER SLIP (SYRINGE) ×2 IMPLANT
WATER STERILE IRR 250ML POUR (IV SOLUTION) ×2 IMPLANT
WIPE NON LINTING 3.25X3.25 (MISCELLANEOUS) ×2 IMPLANT

## 2020-10-13 NOTE — H&P (Signed)
[facility]   Primary Care Physician:  Erasmo Downer, MD Ophthalmologist: Dr. Druscilla Brownie  Pre-Procedure History & Physical: HPI:  Mandy Deleon is a 55 y.o. female here for cataract surgery.   Past Medical History:  Diagnosis Date  . Acid reflux   . Adjustment disorder with depressed mood   . Concussion wth loss of consciousness of 30 minutes or less 12/13/2019  . Depression   . Diverticulitis   . 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 Frequent falls    d/t seizures  . Gait instability    specifically w/ seizure  . 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 Seizures (HCC)   . Ulcerative colitis [facility])     Past Surgical History:  Procedure Laterality Date  . APPENDECTOMY    . CATARACT EXTRACTION W/PHACO Left 09/29/2020   Procedure: CATARACT EXTRACTION PHACO AND INTRAOCULAR LENS PLACEMENT (IOC) LEFT 11.62 01:05.0;  Surgeon: Galen Manila, MD;  Location: [facility];  Service: Ophthalmology;  Laterality: Left;  . CHOLECYSTECTOMY    . CRANIECTOMY / CRANIOTOMY FOR IMPLANTATION NEUROSTIMULATOR ELECTRODES  2011  . EYE SURGERY  10/2019  . HYSTEROSCOPY WITH D & C Bilateral 11/08/2019   Procedure: DILATATION AND CURETTAGE /HYSTEROSCOPY;  Surgeon: Linzie Collin, MD;  Location: [facility] ORS;  Service: Gynecology;  Laterality: Bilateral;  . PARS PLANA VITRECTOMY Left 2021  . RNS     responsive neurostimulation for seizures. (brain electrodes)  . TONSILLECTOMY    . vagas nerve stimulator  2011    Prior to Admission medications   Medication Sig Start Date End Date Taking? Authorizing Provider  acetaminophen (TYLENOL) 500 MG tablet Take 1,000 mg by mouth every 6 (six) hours as needed (pain.).    Yes [provider]  Cenobamate (XCOPRI) 100 MG TABS Take 300 mg by mouth at bedtime.   Yes [provider]  cetirizine (ZYRTEC) 10 MG tablet TAKE 1 TABLET BY MOUTH ONCE  DAILY 07/21/20  Yes Bacigalupo, Marzella Schlein, MD  cholecalciferol (VITAMIN D3) 25 MCG (1000 UNIT) tablet Take 1,000 Units by mouth daily.   Yes [provider]  famotidine (PEPCID) 20 MG tablet Take 20 mg by mouth 2 (two) times daily.   Yes [provider]  levETIRAcetam (KEPPRA) 750 MG tablet Take by mouth 2 (two) times daily. 04/23/20  Yes [provider]  Melatonin 5 MG CAPS Take 5 mg by mouth at bedtime.    Yes [provider]  Methsuximide (CELONTIN) 300 MG CAPS Take by mouth.   Yes [provider]  nitrofurantoin (MACRODANTIN) 50 MG capsule TAKE 1 CAPSULE BY MOUTH NIGHTLY 09/09/20  Yes Masoud, Renda Rolls, MD  oxybutynin (DITROPAN-XL) 10 MG 24 hr tablet Take 1 tablet (10 mg total) by mouth at bedtime. 08/21/20  Yes Bacigalupo, Marzella Schlein, MD  zonisamide (ZONEGRAN) 100 MG capsule Take 100 mg by mouth in the morning, at noon, and at bedtime.  05/13/19 10/13/20 Yes [provider]    Allergies as of 08/17/2020 - Review Complete 07/21/2020  Allergen Reaction Noted  . Avocado Anaphylaxis 11/05/2019  . Aspirin Other (See Comments) 11/05/2019  . Divalproex sodium  11/05/2019  . Hydroxyzine Other (See Comments) 11/05/2019  . Lamotrigine Other (See Comments) 11/05/2019  . Morphine and related Other (See Comments) 06/01/2016  . Phenobarbital Other (See Comments) 11/05/2019  . Phenytoin sodium extended Other (See Comments) 11/05/2019  . Prednisone Other (See Comments) 11/05/2019  .  Zoloft [sertraline] Other (See Comments) 11/05/2019  . Other  07/21/2020  . Adhesive [tape] Other (See Comments) 06/27/2016  . Ativan [lorazepam] Nausea And Vomiting 11/05/2019  . Carbamazepine Other (See Comments) 11/05/2019  . Codeine Other (See Comments) 08/21/2015  . Iodine Rash 06/27/2016  . Montelukast Other (See Comments) 11/05/2019  . Topiramate Other (See Comments) 11/05/2019    Family History  Problem Relation Age of Onset  . Breast cancer Paternal Aunt   .  Ovarian cancer Mother   . Diabetes Mother   . Diabetes Brother   . Diabetes Maternal Aunt   . Diabetes Maternal Uncle   . Diabetes Maternal Grandmother   . Colon cancer Neg Hx     Social History   Socioeconomic History  . Marital status: Widowed    Spouse name: Not on file  . Number of children: Not on file  . Years of education: Not on file  . Highest education level: Not on file  Occupational History    Comment: disabled  Tobacco Use  . Smoking status: Former Smoker    Types: Cigarettes    Quit date: 08/19/1987    Years since quitting: 33.1  . Smokeless tobacco: Never Used  Vaping Use  . Vaping Use: Never used  Substance and Sexual Activity  . Alcohol use: No  . Drug use: No  . Sexual activity: Not Currently  Other Topics Concern  . Not on file  Social History Narrative   Patient lives alone, with her dog.  Her older brother who lives an hour away is very helpful for her.   He will bring her to the hospital but she is not sure who will stay with her after surgery.   Social Determinants of Health   Financial Resource Strain: Not on file  Food Insecurity: Not on file  Transportation Needs: Not on file  Physical Activity: Not on file  Stress: Not on file  Social Connections: Not on file  Intimate Partner Violence: Not on file    Review of Systems: See HPI, otherwise negative ROS  Physical Exam: BP 104/72   Pulse 76   Temp (!) 97.5 F (36.4 C) (Temporal)   Resp 20   Ht 5\' 3"  (1.6 m)   Wt 85 kg   SpO2 99%   BMI 33.21 kg/m  General:   Alert,  pleasant and cooperative in NAD Head:  Normocephalic and atraumatic. Respiratory:  Normal work of breathing. Cardiovascular:  RRR  Impression/Plan: Mandy Deleon is here for cataract surgery.  Risks, benefits, limitations, and alternatives regarding cataract surgery have been reviewed with the patient.  Questions have been answered.  All parties agreeable.   Toney Rakes, MD  10/13/2020, 10:13 AM

## 2020-10-13 NOTE — Transfer of Care (Signed)
Immediate Anesthesia Transfer of Care Note  Patient: Mandy Deleon  Procedure(s) Performed: CATARACT EXTRACTION PHACO AND INTRAOCULAR LENS PLACEMENT (IOC) RIGHT (Right Eye)  Patient Location: PACU  Anesthesia Type: MAC  Level of Consciousness: awake, alert  and patient cooperative  Airway and Oxygen Therapy: Patient Spontanous Breathing and Patient connected to supplemental oxygen  Post-op Assessment: Post-op Vital signs reviewed, Patient's Cardiovascular Status Stable, Respiratory Function Stable, Patent Airway and No signs of Nausea or vomiting  Post-op Vital Signs: Reviewed and stable  Complications: No complications documented.

## 2020-10-13 NOTE — Anesthesia Preprocedure Evaluation (Signed)
Anesthesia Evaluation  Patient identified by MRN, date of birth, ID band Patient awake    Reviewed: Allergy & Precautions, NPO status , Patient's Chart, lab work & pertinent test results, reviewed documented beta blocker date and time   History of Anesthesia Complications Negative for: history of anesthetic complications  Airway Mallampati: III  TM Distance: <3 FB Neck ROM: Full    Dental no notable dental hx.    Pulmonary former smoker,    breath sounds clear to auscultation- rhonchi (-) wheezing      Cardiovascular Exercise Tolerance: Good hypertension, (-) angina(-) DOE  Rhythm:Regular Rate:Normal - Systolic murmurs and - Diastolic murmurs    Neuro/Psych Seizures -,  PSYCHIATRIC DISORDERS Depression    GI/Hepatic PUD, GERD  ,Ulcerative colitis   Endo/Other  Obesity  Renal/GU      Musculoskeletal  (+) Arthritis ,   Abdominal (+) + obese (BMI 33),   Peds  Hematology   Anesthesia Other Findings   Reproductive/Obstetrics                             Anesthesia Physical  Anesthesia Plan  ASA: III  Anesthesia Plan: MAC   Post-op Pain Management:    Induction: Intravenous  PONV Risk Score and Plan: 2 and Midazolam and Treatment may vary due to age or medical condition  Airway Management Planned: Nasal Cannula and Natural Airway  Additional Equipment:   Intra-op Plan:   Post-operative Plan:   Informed Consent: I have reviewed the patients History and Physical, chart, labs and discussed the procedure including the risks, benefits and alternatives for the proposed anesthesia with the patient or authorized representative who has indicated his/her understanding and acceptance.       Plan Discussed with: CRNA and Anesthesiologist  Anesthesia Plan Comments:         Anesthesia Quick Evaluation

## 2020-10-13 NOTE — Anesthesia Postprocedure Evaluation (Signed)
Anesthesia Post Note  Patient: Mandy Deleon  Procedure(s) Performed: CATARACT EXTRACTION PHACO AND INTRAOCULAR LENS PLACEMENT (IOC) RIGHT (Right Eye)     Patient location during evaluation: PACU Anesthesia Type: MAC Level of consciousness: awake and alert Pain management: pain level controlled Vital Signs Assessment: post-procedure vital signs reviewed and stable Respiratory status: spontaneous breathing, nonlabored ventilation, respiratory function stable and patient connected to nasal cannula oxygen Cardiovascular status: stable and blood pressure returned to baseline Postop Assessment: no apparent nausea or vomiting Anesthetic complications: no   No complications documented.  Jamie Hafford A  Glendal Cassaday

## 2020-10-13 NOTE — Op Note (Signed)
PREOPERATIVE DIAGNOSIS:  Nuclear sclerotic cataract of the right eye.   POSTOPERATIVE DIAGNOSIS:  H25.11 Cataract   OPERATIVE PROCEDURE:@   SURGEON:  Galen Manila, MD.   ANESTHESIA:  Anesthesiologist: Heniser, Burman Foster, MD CRNA: Jimmy Picket, CRNA; Maree Krabbe, CRNA  1.      Managed anesthesia care. 2.      0.36ml of Shugarcaine was instilled in the eye following the paracentesis.   COMPLICATIONS:  None.   TECHNIQUE:   Stop and chop   DESCRIPTION OF PROCEDURE:  The patient was examined and consented in the preoperative holding area where the aforementioned topical anesthesia was applied to the right eye and then brought back to the Operating Room where the right eye was prepped and draped in the usual sterile ophthalmic fashion and a lid speculum was placed. A paracentesis was created with the side port blade and the anterior chamber was filled with viscoelastic. A near clear corneal incision was performed with the steel keratome. A continuous curvilinear capsulorrhexis was performed with a cystotome followed by the capsulorrhexis forceps. Hydrodissection and hydrodelineation were carried out with BSS on a blunt cannula. The lens was removed in a stop and chop  technique and the remaining cortical material was removed with the irrigation-aspiration handpiece. The capsular bag was inflated with viscoelastic and the Technis [id number]  lens was placed in the capsular bag without complication. The remaining viscoelastic was removed from the eye with the irrigation-aspiration handpiece. The wounds were hydrated. The anterior chamber was flushed with BSS and the eye was inflated to physiologic pressure. 0.67ml of Vigamox was placed in the anterior chamber. The wounds were found to be water tight. The eye was dressed with Combigan. The patient was given protective glasses to wear throughout the day and a shield with which to sleep tonight. The patient was also given drops with which to begin a drop  regimen today and will follow-up with me in one day. Implant Name Type Inv. Item Serial No. Manufacturer Lot No. LRB No. Used Action  LENS IOL TECNIS EYHANCE 12.5 - [id number] Intraocular Lens LENS IOL TECNIS EYHANCE 12.5 [id number] [employer]ON   Right 1 Implanted   Procedure(s) with comments: CATARACT EXTRACTION PHACO AND INTRAOCULAR LENS PLACEMENT (IOC) RIGHT (Right) - CDE 4.17 00:23.0 minutes  Electronically signed: Galen Manila 10/13/2020 10:37 AM

## 2020-10-13 NOTE — Anesthesia Procedure Notes (Signed)
Procedure Name: MAC Performed by: Kelsye Loomer, CRNA Pre-anesthesia Checklist: Patient identified, Emergency Drugs available, Suction available, Timeout performed and Patient being monitored Patient Re-evaluated:Patient Re-evaluated prior to induction Oxygen Delivery Method: Nasal cannula Placement Confirmation: positive ETCO2       

## 2020-10-14 ENCOUNTER — Encounter: Payer: Self-pay | Admitting: Ophthalmology

## 2020-11-24 ENCOUNTER — Emergency Department: Payer: Medicare Other

## 2020-11-24 ENCOUNTER — Emergency Department
Admission: EM | Admit: 2020-11-24 | Discharge: 2020-11-25 | Disposition: A | Discharge status: Home / Self Care | Payer: Medicare Other | Attending: Emergency Medicine | Admitting: Emergency Medicine

## 2020-11-24 DIAGNOSIS — I10 Essential (primary) hypertension: Secondary | ICD-10-CM | POA: Diagnosis not present

## 2020-11-24 DIAGNOSIS — R4781 Slurred speech: Secondary | ICD-10-CM | POA: Insufficient documentation

## 2020-11-24 DIAGNOSIS — R55 Syncope and collapse: Secondary | ICD-10-CM | POA: Diagnosis not present

## 2020-11-24 DIAGNOSIS — R2681 Unsteadiness on feet: Secondary | ICD-10-CM | POA: Diagnosis not present

## 2020-11-24 DIAGNOSIS — R42 Dizziness and giddiness: Secondary | ICD-10-CM | POA: Insufficient documentation

## 2020-11-24 DIAGNOSIS — R531 Weakness: Secondary | ICD-10-CM | POA: Diagnosis not present

## 2020-11-24 DIAGNOSIS — Z87891 Personal history of nicotine dependence: Secondary | ICD-10-CM | POA: Insufficient documentation

## 2020-11-24 LAB — CBC
HCT: 35.1 % — ABNORMAL LOW (ref 36.0–46.0)
Hemoglobin: 11.7 g/dL — ABNORMAL LOW (ref 12.0–15.0)
MCH: 33.1 pg (ref 26.0–34.0)
MCHC: 33.3 g/dL (ref 30.0–36.0)
MCV: 99.2 fL (ref 80.0–100.0)
Platelets: 214 10*3/uL (ref 150–400)
RBC: 3.54 MIL/uL — ABNORMAL LOW (ref 3.87–5.11)
RDW: 13.5 % (ref 11.5–15.5)
WBC: 5.3 10*3/uL (ref 4.0–10.5)
nRBC: 0 % (ref 0.0–0.2)

## 2020-11-24 LAB — BASIC METABOLIC PANEL
Anion gap: 7 (ref 5–15)
BUN: 20 mg/dL (ref 6–20)
CO2: 23 mmol/L (ref 22–32)
Calcium: 8.7 mg/dL — ABNORMAL LOW (ref 8.9–10.3)
Chloride: 109 mmol/L (ref 98–111)
Creatinine, Ser: 1.33 mg/dL — ABNORMAL HIGH (ref 0.44–1.00)
GFR, Estimated: 47 mL/min — ABNORMAL LOW (ref >60)
Glucose, Bld: 102 mg/dL — ABNORMAL HIGH (ref 70–99)
Potassium: 4.2 mmol/L (ref 3.5–5.1)
Sodium: 139 mmol/L (ref 135–145)

## 2020-11-24 MED ORDER — LEVETIRACETAM IN NACL 1000 MG/100ML IV SOLN
1000.0000 mg | Freq: Once | INTRAVENOUS | Status: AC
  Administered 2020-11-24: 1000 mg via INTRAVENOUS
  Filled 2020-11-24: qty 100

## 2020-11-24 MED ORDER — LEVETIRACETAM 1000 MG PO TABS: 1000.0000 mg | ORAL_TABLET | Freq: Two times a day (BID) | ORAL | 0 refills | Status: DC

## 2020-11-24 NOTE — ED Provider Notes (Signed)
[facility] Emergency Department Provider Note   ____________________________________________   Event Date/Time   First MD Initiated Contact with Patient 11/24/20 2146     (approximate)  I have reviewed the triage vital signs and the nursing notes.   HISTORY  Chief Complaint Near Syncope    HPI Mandy Deleon is a 55 y.o. female with the below stated past medical history including significant history of seizures with a vagal nerve stimulator in place who presents for dizziness, unsteady gait, and worsening slurred speech.  Patient states that all of the symptoms are present at baseline however they were worsened today and prompted her visit to the emergency department.  Patient does not know what was changed but states that some of her antiepileptic medications have been changed over the past few weeks.  Patient states that she has had 3 seizures in the past week after this medication change.  Patient endorses associated tongue trauma from one of the previous seizures but denies any loss of consciousness today.  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   Frequent falls    d/t seizures   Gait instability    specifically w/ seizure   History of viral encephalitis 1980   causing current seizure disorder   Seizures (HCC)    Ulcerative colitis (HCC)     Patient Active Problem List   Diagnosis Date Noted   Hypermobile joints 08/19/2020   Primary osteoarthritis involving multiple joints  08/19/2020   Mixed stress and urge urinary incontinence 08/19/2020   Avitaminosis D 08/19/2020   Elevated alkaline phosphatase level 08/19/2020   Essential hypertension 03/24/2020   Metabolic syndrome 01/13/2020   Nonintractable generalized idiopathic epilepsy without status epilepticus (HCC) 12/13/2019    Past Surgical History:  Procedure Laterality Date   APPENDECTOMY     CATARACT EXTRACTION W/PHACO Left 09/29/2020   Procedure: CATARACT EXTRACTION PHACO AND INTRAOCULAR LENS PLACEMENT (IOC) LEFT 11.62 01:05.0;  Surgeon: Galen Manila, MD;  Location: [facility];  Service: Ophthalmology;  Laterality: Left;   CATARACT EXTRACTION W/PHACO Right 10/13/2020   Procedure: CATARACT EXTRACTION PHACO AND INTRAOCULAR LENS PLACEMENT (IOC) RIGHT;  Surgeon: Galen Manila, MD;  Location: [facility];  Service: Ophthalmology;  Laterality: Right;  CDE 4.17 00:23.0 minutes   CHOLECYSTECTOMY     CRANIECTOMY / CRANIOTOMY FOR IMPLANTATION NEUROSTIMULATOR ELECTRODES  2011   EYE SURGERY  10/2019   HYSTEROSCOPY WITH D & C Bilateral 11/08/2019   Procedure: DILATATION AND CURETTAGE /HYSTEROSCOPY;  Surgeon: Linzie Collin, MD;  Location: [facility];  Service: Gynecology;  Laterality: Bilateral;   PARS PLANA VITRECTOMY Left 2021   RNS     responsive neurostimulation for seizures. (brain electrodes)   TONSILLECTOMY     vagas nerve stimulator  2011    Prior to Admission medications   Medication Sig Start Date End Date Taking? Authorizing Provider  levETIRAcetam (KEPPRA) 1000 MG tablet Take 1 tablet (1,000 mg total) by mouth 2 (two) times daily. 11/24/20  Yes Merwyn Katos, MD  acetaminophen (  TYLENOL) 500 MG tablet Take 1,000 mg by mouth every 6 (six) hours as needed (pain.).     [provider]  Cenobamate (XCOPRI) 100 MG TABS Take 300 mg by mouth at bedtime.    [provider]  cetirizine (ZYRTEC) 10 MG tablet TAKE 1 TABLET BY MOUTH ONCE DAILY 07/21/20   Erasmo Downer, MD  cholecalciferol (VITAMIN D3) 25 MCG (1000 UNIT) tablet Take 1,000 Units by mouth daily.    [provider]  famotidine (PEPCID) 20 MG tablet Take 20 mg by mouth 2 (two) times daily.    [provider]  Melatonin 5 MG CAPS Take 5 mg by mouth at bedtime.     [provider]  Methsuximide (CELONTIN) 300 MG CAPS Take by mouth.    [provider]  nitrofurantoin (MACRODANTIN) 50 MG capsule TAKE 1 CAPSULE BY MOUTH NIGHTLY 09/09/20   Corky Downs, MD  oxybutynin (DITROPAN-XL) 10 MG 24 hr tablet Take 1 tablet (10 mg total) by mouth at bedtime. 08/21/20   Erasmo Downer, MD  zonisamide (ZONEGRAN) 100 MG capsule Take 100 mg by mouth in the morning, at noon, and at bedtime.  05/13/19 10/13/20  [provider]    Allergies Avocado, Aspirin, Divalproex sodium, Hydroxyzine, Lamotrigine, Morphine and related, Phenobarbital, Phenytoin sodium extended, Prednisone, Zoloft [sertraline], Other, Adhesive [tape], Ativan [lorazepam], Carbamazepine, Codeine, Iodine, Montelukast, and Topiramate  Family History  Problem Relation Age of Onset   Breast cancer Paternal Aunt    Ovarian cancer Mother    Diabetes Mother    Diabetes Brother    Diabetes Maternal Aunt    Diabetes Maternal Uncle    Diabetes Maternal Grandmother    Colon cancer Neg Hx     Social History Social History   Tobacco Use   Smoking status: Former    Pack years: 0.00    Types: Cigarettes    Quit date: 08/19/1987    Years since quitting: 33.2   Smokeless tobacco: Never  Vaping Use   Vaping Use: Never used  Substance Use Topics   Alcohol use: No   Drug use: No    Review of Systems Constitutional: No fever/chills Eyes: No visual changes. ENT: No sore throat. Cardiovascular: Denies chest pain. Respiratory: Denies shortness of breath. Gastrointestinal: No abdominal pain.  No nausea, no vomiting.  No diarrhea. Genitourinary: Negative for dysuria. Musculoskeletal: Negative  for acute arthralgias Skin: Negative for rash. Neurological: Endorses difficulty with ambulation.  Negative for headaches, numbness/paresthesias in any extremity Psychiatric: Negative for suicidal ideation/homicidal ideation   ____________________________________________   PHYSICAL EXAM:  VITAL SIGNS: ED Triage Vitals  Enc Vitals Group     BP 11/24/20 2108 119/80     Pulse Rate 11/24/20 2108 81     Resp 11/24/20 2108 16     Temp 11/24/20 2108 98.1 F (36.7 C)     Temp Source 11/24/20 2108 Oral     SpO2 11/24/20 2108 99 %     Weight 11/24/20 2109 189 lb 9.5 oz (86 kg)     Height 11/24/20 2109 5\' 3"  (1.6 m)     Head Circumference --      Peak Flow --      Pain Score 11/24/20 2109 0     Pain Loc --      Pain Edu? --      Excl. in GC? --    Constitutional: Alert and oriented. Well appearing and in no acute distress. Eyes: Conjunctivae are normal. PERRL. Head: Atraumatic.  Nose: No congestion/rhinnorhea. Mouth/Throat: Mucous membranes are moist. Neck: No stridor Cardiovascular: Grossly normal heart sounds.  Good peripheral circulation. Respiratory: Normal respiratory effort.  No retractions. Gastrointestinal: Soft and nontender. No distention. Musculoskeletal: No obvious deformities Neurologic: Slow speech and language.  Slow gait Skin:  Skin is warm and dry. No rash noted. Psychiatric: Mood and affect are normal. Speech and behavior are normal.  ____________________________________________   LABS (all labs ordered are listed, but only abnormal results are displayed)  Labs Reviewed  CBC - Abnormal; Notable for the following components:      Result Value   RBC 3.54 (*)    Hemoglobin 11.7 (*)    HCT 35.1 (*)    All other components within normal limits  BASIC METABOLIC PANEL - Abnormal; Notable for the following components:   Glucose, Bld 102 (*)    Creatinine, Ser 1.33 (*)    Calcium 8.7 (*)    GFR, Estimated 47 (*)    All other components within normal limits   LEVETIRACETAM LEVEL  CBG MONITORING, ED   ____________________________________________  EKG  ED ECG REPORT I, Merwyn KatosEvan K Farzad Tibbetts, the attending physician, personally viewed and interpreted this ECG.  Date: 11/24/2020 EKG Time: 2115 Rate: 79 Rhythm: normal sinus rhythm QRS Axis: normal Intervals: normal ST/T Wave abnormalities: normal Narrative Interpretation: no evidence of acute ischemia  ____________________________________________  RADIOLOGY  ED MD interpretation: CT of the head without contrast shows no evidence of acute abnormalities including no intracerebral hemorrhage, obvious masses, or significant edema.  Postsurgical changes intact  Official radiology report(s): CT Head Wo Contrast  Result Date: 11/24/2020 CLINICAL DATA:  Nonspecific dizziness. Near syncope while walking dog. EXAM: CT HEAD WITHOUT CONTRAST TECHNIQUE: Contiguous axial images were obtained from the base of the skull through the vertex without intravenous contrast. COMPARISON:  Most recent head CT 06/15/2020 FINDINGS: Brain: Significant streak artifact throughout the right hemisphere from metallic calvarial implant. Bilateral deep brain stimulators are in place, unchanged in position. There is also a stimulator terminating in the right frontotemporal region. Stable ventricular size and configuration without hydrocephalus. No evidence of of hemorrhage or acute ischemia. No extra-axial collection in the visualized brain parenchyma. Vascular: No hyperdense vessel. Skull: Right-sided craniotomy with generator implant causing streak artifact. No acute findings. Sinuses/Orbits: No mastoid effusion. Paranasal sinuses are clear. Bilateral cataract resection Other: None. IMPRESSION: 1. Stable head CT without acute findings. 2. Right calvarial postsurgical change with metallic streak artifact obscuring large portion of the right hemisphere. Stable stimulators. Electronically Signed   By: Narda RutherfordMelanie  Sanford M.D.   On: 11/24/2020  23:23    ____________________________________________   PROCEDURES  Procedure(s) performed (including Critical Care):  .1-3 Lead EKG Interpretation  Date/Time: 11/24/2020 11:39 PM Performed by: Merwyn KatosBradler, Embry Huss K, MD Authorized by: Merwyn KatosBradler, Jordani Nunn K, MD     Interpretation: normal     ECG rate:  73   ECG rate assessment: normal     Rhythm: sinus rhythm     Ectopy: none     Conduction: normal     ____________________________________________   INITIAL IMPRESSION / ASSESSMENT AND PLAN / ED COURSE  As part of my medical decision making, I reviewed the following data within the electronic MEDICAL RECORD NUMBER Nursing notes reviewed and incorporated, Labs reviewed, EKG interpreted, Old chart reviewed, Radiograph reviewed and Notes from prior ED visits reviewed and incorporated        Patient presents after recent seizure episode.  Patient had slow return to baseline mental and physical function per  patient. No immunosuppresion hx and had no preceding fever. No history of alcohol abuse or suspicion for toxin ingestion. Unlikely stroke, syncope. Unlikely infectious etiology. No preceding trauma.  Workup: EKG, BMP, POCT glucose (pregnancy test if female) and CT Brain.  Field Interventions: None ED Interventions: 1 g Keppra IV Consult: Per neurology, increasing patient's Keppra from 750 twice daily to 1 g twice daily and proximate follow-up Disposition: Discharge home with primary care follow up in next 24-48 hours.      ____________________________________________   FINAL CLINICAL IMPRESSION(S) / ED DIAGNOSES  Final diagnoses:  Near syncope  Weakness  Lightheadedness     ED Discharge Orders          Ordered    levETIRAcetam (KEPPRA) 1000 MG tablet  2 times daily        11/24/20 2318             Note:  This document was prepared using Dragon voice recognition software and may include unintentional dictation errors.    Merwyn Katos, MD 11/24/20 [phone]

## 2020-11-24 NOTE — ED Triage Notes (Addendum)
Patient arrives via EMS with near syncopal episode while walking her dog. Patient reports possible seizure, stating hx of seizure, and recent adjustment to her medications. Patient is alert, oriented x4. Patient does report speech is slurred more than her baseline. Patient denies falling and denies LOC.

## 2020-11-24 NOTE — Discharge Instructions (Addendum)
Increase Keppra to 1000mg  twice daily.  Do not drive until cleared by your neurologist.  Return to the ER for worsening symptoms, persistent vomiting, difficulty breathing or other concerns.

## 2020-11-25 NOTE — ED Provider Notes (Signed)
-----------------------------------------   12:31 AM on 11/25/2020 -----------------------------------------  CT head interpreted per Dr. Marisue Humble:  1. Stable head CT without acute findings.  2. Right calvarial postsurgical change with metallic streak artifact  obscuring large portion of the right hemisphere. Stable stimulators.   Unremarkable CT head.  Will discharge per previous providers plan.   Irean Hong, MD 11/25/20 [phone]

## 2020-12-21 ENCOUNTER — Other Ambulatory Visit: Payer: Self-pay | Admitting: Family Medicine

## 2021-02-18 ENCOUNTER — Ambulatory Visit: Payer: Self-pay | Admitting: Family Medicine

## 2021-02-22 ENCOUNTER — Ambulatory Visit: Payer: Self-pay | Admitting: Family Medicine

## 2021-03-17 ENCOUNTER — Other Ambulatory Visit: Payer: Self-pay | Admitting: Family Medicine

## 2021-03-22 ENCOUNTER — Ambulatory Visit (INDEPENDENT_AMBULATORY_CARE_PROVIDER_SITE_OTHER): Payer: Medicare Other | Admitting: Family Medicine

## 2021-03-22 ENCOUNTER — Encounter: Payer: Self-pay | Admitting: Family Medicine

## 2021-03-22 ENCOUNTER — Other Ambulatory Visit: Payer: Self-pay

## 2021-03-22 VITALS — BP 114/90 | HR 90 | Temp 98.4°F | Resp 16 | Wt 182.7 lb

## 2021-03-22 DIAGNOSIS — R748 Abnormal levels of other serum enzymes: Secondary | ICD-10-CM | POA: Diagnosis not present

## 2021-03-22 DIAGNOSIS — D649 Anemia, unspecified: Secondary | ICD-10-CM | POA: Diagnosis not present

## 2021-03-22 DIAGNOSIS — G40309 Generalized idiopathic epilepsy and epileptic syndromes, not intractable, without status epilepticus: Secondary | ICD-10-CM | POA: Diagnosis not present

## 2021-03-22 DIAGNOSIS — I10 Essential (primary) hypertension: Secondary | ICD-10-CM

## 2021-03-22 DIAGNOSIS — Z23 Encounter for immunization: Secondary | ICD-10-CM | POA: Diagnosis not present

## 2021-03-22 DIAGNOSIS — E66811 Obesity, class 1: Secondary | ICD-10-CM

## 2021-03-22 DIAGNOSIS — R739 Hyperglycemia, unspecified: Secondary | ICD-10-CM

## 2021-03-22 DIAGNOSIS — E559 Vitamin D deficiency, unspecified: Secondary | ICD-10-CM

## 2021-03-22 DIAGNOSIS — Z6832 Body mass index (BMI) 32.0-32.9, adult: Secondary | ICD-10-CM

## 2021-03-22 DIAGNOSIS — E669 Obesity, unspecified: Secondary | ICD-10-CM

## 2021-03-22 MED ORDER — OXYBUTYNIN CHLORIDE ER 10 MG PO TB24: 10.0000 mg | ORAL_TABLET | Freq: Every day | ORAL | 1 refills | Status: DC

## 2021-03-22 NOTE — Assessment & Plan Note (Signed)
Well controlled Lifestyle management, not on meds Recent metabolic panel

## 2021-03-22 NOTE — Progress Notes (Signed)
Established patient visit   Patient: Mandy Deleon   DOB: 05-07-1966   55 y.o. Female  MRN: [id number] Visit Date: 03/22/2021  Today's healthcare provider: Lavon Paganini, MD   Chief Complaint  Patient presents with   Hypertension   Subjective    HPI  Hypertension, follow-up  BP Readings from Last 3 Encounters:  03/22/21 114/90  11/25/20 105/76  10/13/20 93/70   Wt Readings from Last 3 Encounters:  03/22/21 182 lb 11.2 oz (82.9 kg)  11/24/20 189 lb 9.5 oz (86 kg)  10/13/20 187 lb 8 oz (85 kg)     She was last seen for hypertension 7 months ago.  BP at that visit was 97/70. Management since that visit includes no changes.  She reports excellent compliance with treatment. She is not having side effects.  She is following a Regular diet. She is exercising. She does not smoke.  Use of agents associated with hypertension: none.   Outside blood pressures are stable. Symptoms: No chest pain No chest pressure  No palpitations No syncope  No dyspnea No orthopnea  No paroxysmal nocturnal dyspnea No lower extremity edema   Pertinent labs: Lab Results  Component Value Date   CHOL 233 (H) 03/22/2021   HDL 66 03/22/2021   LDLCALC 149 (H) 03/22/2021   TRIG 105 03/22/2021   CHOLHDL 3.5 03/22/2021   Lab Results  Component Value Date   NA 144 03/22/2021   K 5.2 03/22/2021   CREATININE 1.02 (H) 03/22/2021   EGFR 65 03/22/2021   GFRNONAA 47 (L) 11/24/2020   GLUCOSE 99 03/22/2021     The 10-year ASCVD risk score (Arnett DK, et al., 2019) is: 1.7%   ---------------------------------------------------------------------------------------------------     Medications: Outpatient Medications Prior to Visit  Medication Sig   acetaminophen (TYLENOL) 500 MG tablet Take 1,000 mg by mouth every 6 (six) hours as needed (pain.).    Cenobamate (XCOPRI) 100 MG TABS Take 300 mg by mouth at bedtime.   cetirizine (ZYRTEC) 10 MG tablet TAKE 1 TABLET BY MOUTH ONCE DAILY    cholecalciferol (VITAMIN D3) 25 MCG (1000 UNIT) tablet Take 1,000 Units by mouth daily.   famotidine (PEPCID) 20 MG tablet Take 20 mg by mouth 2 (two) times daily.   levETIRAcetam (KEPPRA) 1000 MG tablet Take 1 tablet (1,000 mg total) by mouth 2 (two) times daily.   Melatonin 5 MG CAPS Take 5 mg by mouth at bedtime.    Methsuximide (CELONTIN) 300 MG CAPS Take by mouth.   nitrofurantoin (MACRODANTIN) 50 MG capsule TAKE 1 CAPSULE BY MOUTH NIGHTLY   [DISCONTINUED] oxybutynin (DITROPAN-XL) 10 MG 24 hr tablet TAKE 1 TABLET BY MOUTH EVERY NIGHT AT BEDTIME   zonisamide (ZONEGRAN) 100 MG capsule Take 100 mg by mouth in the morning, at noon, and at bedtime.    No facility-administered medications prior to visit.    Review of Systems  Constitutional:  Positive for activity change.  Respiratory:  Negative for chest tightness and shortness of breath.   Cardiovascular:  Negative for chest pain, palpitations and leg swelling.  Neurological:  Positive for dizziness.      Objective    BP 114/90 (BP Location: Left Arm, Patient Position: Sitting, Cuff Size: Large)   Pulse 90   Temp 98.4 F (36.9 C) (Oral)   Resp 16   Wt 182 lb 11.2 oz (82.9 kg)   SpO2 100%   BMI 32.36 kg/m  {Show previous vital signs (optional):23777}  Physical Exam Vitals  reviewed.  Constitutional:      General: She is not in acute distress.    Appearance: Normal appearance. She is well-developed. She is not diaphoretic.  HENT:     Head: Normocephalic and atraumatic.  Eyes:     General: No scleral icterus.    Conjunctiva/sclera: Conjunctivae normal.  Neck:     Thyroid: No thyromegaly.  Cardiovascular:     Rate and Rhythm: Normal rate and regular rhythm.     Pulses: Normal pulses.     Heart sounds: Normal heart sounds. No murmur heard. Pulmonary:     Effort: Pulmonary effort is normal. No respiratory distress.     Breath sounds: Normal breath sounds. No wheezing, rhonchi or rales.  Musculoskeletal:     Cervical  back: Neck supple.     Right lower leg: No edema.     Left lower leg: No edema.  Lymphadenopathy:     Cervical: No cervical adenopathy.  Skin:    General: Skin is warm and dry.     Findings: No rash.  Neurological:     Mental Status: She is alert and oriented to person, place, and time. Mental status is at baseline.  Psychiatric:        Mood and Affect: Mood normal.        Behavior: Behavior normal.      Results for orders placed or performed in visit on 03/22/21  CBC  Result Value Ref Range   WBC 5.2 3.4 - 10.8 x10E3/uL   RBC 4.03 3.77 - 5.28 x10E6/uL   Hemoglobin 13.4 11.1 - 15.9 g/dL   Hematocrit 39.2 34.0 - 46.6 %   MCV 97 79 - 97 fL   MCH 33.3 (H) 26.6 - 33.0 pg   MCHC 34.2 31.5 - 35.7 g/dL   RDW 12.4 11.7 - 15.4 %   Platelets 271 150 - 450 x10E3/uL  Comprehensive metabolic panel  Result Value Ref Range   Glucose 99 70 - 99 mg/dL   BUN 20 6 - 24 mg/dL   Creatinine, Ser 1.02 (H) 0.57 - 1.00 mg/dL   eGFR 65 >59 mL/min/1.73   BUN/Creatinine Ratio 20 9 - 23   Sodium 144 134 - 144 mmol/L   Potassium 5.2 3.5 - 5.2 mmol/L   Chloride 109 (H) 96 - 106 mmol/L   CO2 20 20 - 29 mmol/L   Calcium 9.5 8.7 - 10.2 mg/dL   Total Protein 6.8 6.0 - 8.5 g/dL   Albumin 4.7 3.8 - 4.9 g/dL   Globulin, Total 2.1 1.5 - 4.5 g/dL   Albumin/Globulin Ratio 2.2 1.2 - 2.2   Bilirubin Total <0.2 0.0 - 1.2 mg/dL   Alkaline Phosphatase 170 (H) 44 - 121 IU/L   AST 21 0 - 40 IU/L   ALT 26 0 - 32 IU/L  Lipid panel  Result Value Ref Range   Cholesterol, Total 233 (H) 100 - 199 mg/dL   Triglycerides 105 0 - 149 mg/dL   HDL 66 >39 mg/dL   VLDL Cholesterol Cal 18 5 - 40 mg/dL   LDL Chol Calc (NIH) 149 (H) 0 - 99 mg/dL   Chol/HDL Ratio 3.5 0.0 - 4.4 ratio  VITAMIN D 25 Hydroxy (Vit-D Deficiency, Fractures)  Result Value Ref Range   Vit D, 25-Hydroxy 33.0 30.0 - 100.0 ng/mL  Hemoglobin A1c  Result Value Ref Range   Hgb A1c MFr Bld 5.4 4.8 - 5.6 %   Est. average glucose Bld gHb Est-mCnc 108  mg/dL  Assessment & Plan     Problem List Items Addressed This Visit       Cardiovascular and Mediastinum   Essential hypertension - Primary    Well controlled Lifestyle management, not on meds Recent metabolic panel        Nervous and Auditory   Nonintractable generalized idiopathic epilepsy without status epilepticus ([place])    No recent seizure activity Followed and managed by [facility] No changes to AEDs        Other   Avitaminosis D    Continue supplement Recheck level      Relevant Orders   VITAMIN D 25 Hydroxy (Vit-D Deficiency, Fractures) (Completed)   Elevated alkaline phosphatase level    Recheck LFTs      Relevant Orders   Comprehensive metabolic panel (Completed)   Other Visit Diagnoses     Anemia, unspecified type       Relevant Orders   CBC (Completed)   Hyperglycemia       Relevant Orders   Hemoglobin A1c (Completed)   Class 1 obesity without serious comorbidity with body mass index (BMI) of 32.0 to 32.9 in adult, unspecified obesity type       Relevant Orders   Lipid panel (Completed)   Need for influenza vaccination       Relevant Orders   Flu Vaccine QUAD 50moIM (Fluarix, Fluzone & Alfiuria Quad PF) (Completed)        Return in about 6 months (around 09/20/2021) for CPE.      I, ALavon Paganini MD, have reviewed all documentation for this visit. The documentation on 03/23/21 for the exam, diagnosis, procedures, and orders are all accurate and complete.   Adama Ivins, ADionne Bucy MD, MPH [facility]

## 2021-03-22 NOTE — Assessment & Plan Note (Signed)
No recent seizure activity Followed and managed by [facility] No changes to AEDs

## 2021-03-22 NOTE — Assessment & Plan Note (Signed)
Recheck LFTs 

## 2021-03-22 NOTE — Assessment & Plan Note (Signed)
Continue supplement Recheck level 

## 2021-03-23 LAB — COMPREHENSIVE METABOLIC PANEL
ALT: 26 IU/L (ref 0–32)
AST: 21 IU/L (ref 0–40)
Albumin/Globulin Ratio: 2.2 (ref 1.2–2.2)
Albumin: 4.7 g/dL (ref 3.8–4.9)
Alkaline Phosphatase: 170 IU/L — ABNORMAL HIGH (ref 44–121)
BUN/Creatinine Ratio: 20 (ref 9–23)
BUN: 20 mg/dL (ref 6–24)
Bilirubin Total: <0.2 mg/dL (ref 0.0–1.2)
CO2: 20 mmol/L (ref 20–29)
Calcium: 9.5 mg/dL (ref 8.7–10.2)
Chloride: 109 mmol/L — ABNORMAL HIGH (ref 96–106)
Creatinine, Ser: 1.02 mg/dL — ABNORMAL HIGH (ref 0.57–1.00)
Globulin, Total: 2.1 g/dL (ref 1.5–4.5)
Glucose: 99 mg/dL (ref 70–99)
Potassium: 5.2 mmol/L (ref 3.5–5.2)
Sodium: 144 mmol/L (ref 134–144)
Total Protein: 6.8 g/dL (ref 6.0–8.5)
eGFR: 65 mL/min/{1.73_m2} (ref >59)

## 2021-03-23 LAB — CBC
Hematocrit: 39.2 % (ref 34.0–46.6)
Hemoglobin: 13.4 g/dL (ref 11.1–15.9)
MCH: 33.3 pg — ABNORMAL HIGH (ref 26.6–33.0)
MCHC: 34.2 g/dL (ref 31.5–35.7)
MCV: 97 fL (ref 79–97)
Platelets: 271 10*3/uL (ref 150–450)
RBC: 4.03 x10E6/uL (ref 3.77–5.28)
RDW: 12.4 % (ref 11.7–15.4)
WBC: 5.2 10*3/uL (ref 3.4–10.8)

## 2021-03-23 LAB — LIPID PANEL
Chol/HDL Ratio: 3.5 ratio (ref 0.0–4.4)
Cholesterol, Total: 233 mg/dL — ABNORMAL HIGH (ref 100–199)
HDL: 66 mg/dL (ref >39)
LDL Chol Calc (NIH): 149 mg/dL — ABNORMAL HIGH (ref 0–99)
Triglycerides: 105 mg/dL (ref 0–149)
VLDL Cholesterol Cal: 18 mg/dL (ref 5–40)

## 2021-03-23 LAB — HEMOGLOBIN A1C
Est. average glucose Bld gHb Est-mCnc: 108 mg/dL
Hgb A1c MFr Bld: 5.4 % (ref 4.8–5.6)

## 2021-03-23 LAB — VITAMIN D 25 HYDROXY (VIT D DEFICIENCY, FRACTURES): Vit D, 25-Hydroxy: 33 ng/mL (ref 30.0–100.0)

## 2021-03-31 ENCOUNTER — Other Ambulatory Visit: Payer: Self-pay | Admitting: Internal Medicine

## 2021-04-28 ENCOUNTER — Other Ambulatory Visit: Payer: Self-pay

## 2021-04-28 ENCOUNTER — Emergency Department: Payer: Medicare Other

## 2021-04-28 ENCOUNTER — Encounter: Payer: Self-pay | Admitting: Emergency Medicine

## 2021-04-28 ENCOUNTER — Emergency Department
Admission: EM | Admit: 2021-04-28 | Discharge: 2021-04-28 | Disposition: A | Discharge status: Home / Self Care | Payer: Medicare Other | Attending: Emergency Medicine | Admitting: Emergency Medicine

## 2021-04-28 ENCOUNTER — Ambulatory Visit: Payer: Self-pay

## 2021-04-28 ENCOUNTER — Ambulatory Visit: Payer: Medicare Other | Admitting: Physician Assistant

## 2021-04-28 DIAGNOSIS — R509 Fever, unspecified: Secondary | ICD-10-CM | POA: Diagnosis present

## 2021-04-28 DIAGNOSIS — Z5321 Procedure and treatment not carried out due to patient leaving prior to being seen by health care provider: Secondary | ICD-10-CM | POA: Diagnosis not present

## 2021-04-28 DIAGNOSIS — U071 COVID-19: Secondary | ICD-10-CM | POA: Insufficient documentation

## 2021-04-28 LAB — CBC WITH DIFFERENTIAL/PLATELET
Abs Immature Granulocytes: 0.03 10*3/uL (ref 0.00–0.07)
Basophils Absolute: 0 10*3/uL (ref 0.0–0.1)
Basophils Relative: 1 %
Eosinophils Absolute: 0 10*3/uL (ref 0.0–0.5)
Eosinophils Relative: 1 %
HCT: 40.4 % (ref 36.0–46.0)
Hemoglobin: 13.2 g/dL (ref 12.0–15.0)
Immature Granulocytes: 1 %
Lymphocytes Relative: 12 %
Lymphs Abs: 0.7 10*3/uL (ref 0.7–4.0)
MCH: 33.9 pg (ref 26.0–34.0)
MCHC: 32.7 g/dL (ref 30.0–36.0)
MCV: 103.9 fL — ABNORMAL HIGH (ref 80.0–100.0)
Monocytes Absolute: 0.7 10*3/uL (ref 0.1–1.0)
Monocytes Relative: 12 %
Neutro Abs: 4.1 10*3/uL (ref 1.7–7.7)
Neutrophils Relative %: 73 %
Platelets: 227 10*3/uL (ref 150–400)
RBC: 3.89 MIL/uL (ref 3.87–5.11)
RDW: 13.2 % (ref 11.5–15.5)
WBC: 5.5 10*3/uL (ref 4.0–10.5)
nRBC: 0 % (ref 0.0–0.2)

## 2021-04-28 LAB — URINALYSIS, ROUTINE W REFLEX MICROSCOPIC
Bacteria, UA: NONE SEEN
Bilirubin Urine: NEGATIVE
Glucose, UA: NEGATIVE mg/dL
Hgb urine dipstick: NEGATIVE
Ketones, ur: NEGATIVE mg/dL
Leukocytes,Ua: NEGATIVE
Nitrite: NEGATIVE
Protein, ur: 30 mg/dL — AB
Specific Gravity, Urine: 1.035 — ABNORMAL HIGH (ref 1.005–1.030)
pH: 5 (ref 5.0–8.0)

## 2021-04-28 LAB — RESP PANEL BY RT-PCR (FLU A&B, COVID) ARPGX2
Influenza A by PCR: NEGATIVE
Influenza B by PCR: NEGATIVE
SARS Coronavirus 2 by RT PCR: POSITIVE — AB

## 2021-04-28 LAB — LIPASE, BLOOD: Lipase: 41 U/L (ref 11–51)

## 2021-04-28 LAB — COMPREHENSIVE METABOLIC PANEL
ALT: 38 U/L (ref 0–44)
AST: 37 U/L (ref 15–41)
Albumin: 4.2 g/dL (ref 3.5–5.0)
Alkaline Phosphatase: 158 U/L — ABNORMAL HIGH (ref 38–126)
Anion gap: 9 (ref 5–15)
BUN: 19 mg/dL (ref 6–20)
CO2: 22 mmol/L (ref 22–32)
Calcium: 9 mg/dL (ref 8.9–10.3)
Chloride: 106 mmol/L (ref 98–111)
Creatinine, Ser: 1.38 mg/dL — ABNORMAL HIGH (ref 0.44–1.00)
GFR, Estimated: 45 mL/min — ABNORMAL LOW (ref >60)
Glucose, Bld: 119 mg/dL — ABNORMAL HIGH (ref 70–99)
Potassium: 4 mmol/L (ref 3.5–5.1)
Sodium: 137 mmol/L (ref 135–145)
Total Bilirubin: 0.4 mg/dL (ref 0.3–1.2)
Total Protein: 7.4 g/dL (ref 6.5–8.1)

## 2021-04-28 NOTE — ED Triage Notes (Signed)
Pt reports fever and NV all day today. Pt also reports fell and hit her nose and lower lip.

## 2021-04-28 NOTE — ED Provider Notes (Signed)
Emergency Medicine Provider Triage Evaluation Note  Mandy Deleon , a 55 y.o. female  was evaluated in triage.  Pt complains of fever, emesis. Endorses some abdominal pain, chest pain, and throat pain while vomiting. No active pain at this time. Additionally states that she fell and hit her nose and lower lip. Pt states that she does not remember how she fell at all. 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   Review of Systems  Positive: Emesis, abdominal discomfort, chest discomfort Negative:        Weakness, SOB, diarrhea  Physical Exam  There were no vitals taken for this visit. Gen:   Awake, no distress    Resp:  Normal effort  MSK:   Moves extremities without difficulty  Other:    Medical Decision Making  Medically screening exam initiated at 3:44 PM.  Appropriate orders placed.  [name] was informed that the remainder of the evaluation will be completed by another provider, this initial triage assessment does not replace that evaluation, and the importance of remaining in the ED until their evaluation is complete.  Pt appears stable, but has a concerning history. Will order labs, CXR, urinalysis, EKG. Due to RNS implant, will hold off on head CT at this time.    Varney Daily, [place] 04/28/21 1604    Phineas Semen, MD 04/28/21 [phone]

## 2021-04-28 NOTE — Telephone Encounter (Signed)
Pt called in stating she fell this morning and hit her head and nose and had bad nosebleed after with moderate amount of blood to where she called 911. She [place] across from [facility] so they came and told her it didn't look broken to use some ice to see if that helps the bleeding. She says the bleeding did slow down and has stopped during call. Says she was walking down and came back in house, phone was ringing and tripped and fell. She is also concerned about her projectile vomiting episode she had in the middle of the night. She says that she's had all of her vaccines and hasn't ate anything unusual. D/t scheduling limitations called and spoke with [place], [facility] who scheduled pt appt today at 1440 with Lillia Abed, [place]. Pt had to call around to see if someone can bring her to appt d/t not being able to drive d/t epilepsy. Advised to call back and cancel appt if needed.    Reason for Disposition  Nose swelling, bruise or pain  Answer Assessment - Initial Assessment Questions 1. AMOUNT OF BLEEDING: "How bad is the bleeding?" "How much blood was lost?" "Has the bleeding stopped?"   - MILD: needed a couple tissues   - MODERATE: needed many tissues   - SEVERE: large blood clots, soaked many tissues, lasted more than 30 minutes      moderate 2. ONSET: "When did the nosebleed start?"      8-9 AM  3. FREQUENCY: "How many nosebleeds have you had in the last 24 hours?"      1 4. RECURRENT SYMPTOMS: "Have there been other recent nosebleeds?" If Yes, ask: "How long did it take you to stop the bleeding?" "What worked best?"      NO 5. CAUSE: "What do you think caused this nosebleed?"     Fell and hit head and nose when came in from walking dog 6. LOCAL FACTORS: "Do you have any cold symptoms?", "Have you been rubbing or picking at your nose?"     no 7. SYSTEMIC FACTORS: "Do you have high blood pressure or any bleeding problems?"     No 8. BLOOD THINNERS: "Do you take any blood thinners?" (e.g., aspirin, clopidogrel  / Plavix, coumadin, heparin). Notes: Other strong blood thinners include: Arixtra (fondaparinux), Eliquis (apixaban), Pradaxa (dabigatran), and Xarelto (rivaroxaban).     No 9. OTHER SYMPTOMS: "Do you have any other symptoms?" (e.g., lightheadedness)     Fever, nose swollen, sore 10. PREGNANCY: "Is there any chance you are pregnant?" "When was your last menstrual period?"       No  Protocols used: Nosebleed-A-AH, Nose Injury-A-AH

## 2021-04-28 NOTE — Telephone Encounter (Signed)
Patient advised to go to ED due to medical history and symptoms. Will cancel office visit for today.

## 2021-06-04 ENCOUNTER — Ambulatory Visit: Payer: Self-pay

## 2021-06-04 NOTE — Telephone Encounter (Signed)
°  Chief Complaint: vomiting Symptoms: vomiting Frequency: today Pertinent Negatives: Patient denies diarrhea Disposition: [] ED /[] Urgent Care (no appt availability in office) / [] Appointment(In office/virtual)/ []  [facility]/ [x] Home Care/ [] Refused Recommended Disposition /[] [place] [facility]/ []  Follow-up with PCP Additional Notes: Pt states she has vomited x 2 today and been nauseous, no other symptoms present. Advised pt she may have a stomach bug since her boyfriend has same symptoms. Care advice given and pt verbalized understanding. No other questions/concerns noted.     Summary: shivers, vomited   Patient vomited earlier today and has shivers, she says drink states down  and hasnt eaten since earlier at 2pm, didn't request appt     Reason for Disposition  MILD or MODERATE vomiting (e.g., 1 - 5 times / day)  Answer Assessment - Initial Assessment Questions 1. VOMITING SEVERITY: "How many times have you vomited in the past 24 hours?"     - MILD:  1 - 2 times/day    - MODERATE: 3 - 5 times/day, decreased oral intake without significant weight loss or symptoms of dehydration    - SEVERE: 6 or more times/day, vomits everything or nearly everything, with significant weight loss, symptoms of dehydration      twice 2. ONSET: "When did the vomiting begin?"      today 3. FLUIDS: "What fluids or food have you vomited up today?" "Have you been able to keep any fluids down?"     Not much 4. ABDOMINAL PAIN: "Are your having any abdominal pain?" If yes : "How bad is it and what does it feel like?" (e.g., crampy, dull, intermittent, constant)      No 5. DIARRHEA: "Is there any diarrhea?" If Yes, ask: "How many times today?"      No 8. HYDRATION STATUS: "Any signs of dehydration?" (e.g., dry mouth [not only dry lips], too weak to stand) "When did you last urinate?"     No 9. OTHER SYMPTOMS: "Do you have any other symptoms?" (e.g., fever, headache, vertigo, vomiting blood or  coffee grounds, recent head injury)     nausea  Protocols used: Vomiting-A-AH

## 2021-08-31 ENCOUNTER — Telehealth: Payer: Self-pay | Admitting: Family Medicine

## 2021-08-31 NOTE — Telephone Encounter (Signed)
Copied from CRM [id number]. Topic: Medicare AWV ?>> Aug 31, 2021  1:25 PM Claudette Laws R wrote: ?Reason for CRM:  ?Left message for patient to call back and schedule Medicare Annual Wellness Visit (AWV) in office.  ? ?If unable to come into the office for AWV,  please offer to do virtually or by telephone. ? ?Last AWV: 03/24/2020 ? ?Please schedule at anytime with [facility]. ? ?30 minute appointment for Virtual or phone ?45 minute appointment for in office or Initial virtual/phone ? ?Any questions, please contact me at [phone] ?

## 2021-09-10 ENCOUNTER — Other Ambulatory Visit: Payer: Self-pay | Admitting: Family Medicine

## 2021-09-21 ENCOUNTER — Ambulatory Visit (INDEPENDENT_AMBULATORY_CARE_PROVIDER_SITE_OTHER): Payer: Medicare Other | Admitting: Family Medicine

## 2021-09-21 ENCOUNTER — Encounter: Payer: Self-pay | Admitting: Family Medicine

## 2021-09-21 VITALS — BP 96/66 | HR 55 | Temp 97.7°F | Resp 16 | Ht 63.0 in | Wt 169.2 lb

## 2021-09-21 DIAGNOSIS — G40309 Generalized idiopathic epilepsy and epileptic syndromes, not intractable, without status epilepticus: Secondary | ICD-10-CM

## 2021-09-21 DIAGNOSIS — I10 Essential (primary) hypertension: Secondary | ICD-10-CM | POA: Diagnosis not present

## 2021-09-21 DIAGNOSIS — E559 Vitamin D deficiency, unspecified: Secondary | ICD-10-CM

## 2021-09-21 DIAGNOSIS — D649 Anemia, unspecified: Secondary | ICD-10-CM | POA: Diagnosis not present

## 2021-09-21 DIAGNOSIS — Z1231 Encounter for screening mammogram for malignant neoplasm of breast: Secondary | ICD-10-CM | POA: Diagnosis not present

## 2021-09-21 DIAGNOSIS — Z0000 Encounter for general adult medical examination without abnormal findings: Secondary | ICD-10-CM | POA: Diagnosis not present

## 2021-09-21 DIAGNOSIS — N3946 Mixed incontinence: Secondary | ICD-10-CM

## 2021-09-21 DIAGNOSIS — R748 Abnormal levels of other serum enzymes: Secondary | ICD-10-CM

## 2021-09-21 DIAGNOSIS — R739 Hyperglycemia, unspecified: Secondary | ICD-10-CM

## 2021-09-21 DIAGNOSIS — M79631 Pain in right forearm: Secondary | ICD-10-CM | POA: Insufficient documentation

## 2021-09-21 NOTE — Patient Instructions (Signed)
Consider asking at the pharmacy about the tetanus shot (TDAP) and the shingles shot (Shingrix)  ?

## 2021-09-21 NOTE — Assessment & Plan Note (Signed)
Well controlled ?Lifestyle management, not on meds ?Recheck metabolic panel ?

## 2021-09-21 NOTE — Assessment & Plan Note (Signed)
Some pain in supinators with resisted movement, but no tennis elbow ?Recommend ice, rest, HEP given ?

## 2021-09-21 NOTE — Progress Notes (Signed)
? ?I,Sulibeya S Dimas,acting as a scribe for [name], MD.,have documented all relevant documentation on the behalf of [name], MD,as directed by  [name], MD while in the presence of [name], MD. ? ? ? ?Annual Wellness Visit ? ? ?Patient: Mandy Deleon   DOB: [birth year]   56 y.o. Female  MRN: [id number] ?Visit Date: 09/21/2021 ? ?Today's healthcare provider: [name], MD  ? ?Chief Complaint  ?Patient presents with  ? Annual Exam  ? ?Subjective  ?  ?Mandy Deleon is a 56 y.o. female who presents today for a complete physical exam.  ?She reports consuming a general diet. The patient does not participate in regular exercise at present. She generally feels well. She reports sleeping well. She does have additional problems to discuss today.  ?HPI  ?Patient C/O right arm pain x a few months. Patient reports pain with activity and worsening in the last few weeks.  Elbow to wrist on palm side. ? ?F/b neurology at [facility] regularly ? ?Past Medical History:  ?Diagnosis Date  ? Acid reflux   ? Adjustment disorder with depressed mood   ? Concussion wth loss of consciousness of 30 minutes or less 12/13/2019  ? Depression   ? Diverticulitis   ? 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 Frequent falls   ? d/t seizures  ? Gait instability   ? specifically w/ seizure  ? History of viral encephalitis 1980  ? causing current seizure disorder  ? Seizures (HCC)   ? Ulcerative colitis (HCC)   ? ?Past Surgical History:  ?Procedure Laterality Date  ? APPENDECTOMY    ? CATARACT EXTRACTION W/PHACO Left 09/29/2020  ? Procedure: CATARACT EXTRACTION PHACO AND INTRAOCULAR LENS PLACEMENT (IOC) LEFT 11.62 01:05.0;  Surgeon: Galen Manila, MD;  Location: [facility];  Service: Ophthalmology;  Laterality: Left;  ? CATARACT EXTRACTION W/PHACO Right 10/13/2020  ? Procedure: CATARACT EXTRACTION PHACO AND INTRAOCULAR LENS PLACEMENT (IOC) RIGHT;   Surgeon: Galen Manila, MD;  Location: [facility];  Service: Ophthalmology;  Laterality: Right;  CDE 4.17 ?00:23.0 minutes  ? CHOLECYSTECTOMY    ? CRANIECTOMY / CRANIOTOMY FOR IMPLANTATION NEUROSTIMULATOR ELECTRODES  2011  ? EYE SURGERY  10/2019  ? HYSTEROSCOPY WITH D & C Bilateral 11/08/2019  ? Procedure: DILATATION AND CURETTAGE /HYSTEROSCOPY;  Surgeon: Linzie Collin, MD;  Location: [facility];  Service: Gynecology;  Laterality: Bilateral;  ? PARS PLANA VITRECTOMY Left 2021  ? RNS    ? responsive neurostimulation for seizures. (brain electrodes)  ? TONSILLECTOMY    ? vagas nerve stimulator  2011  ? ?Social History  ? ?Socioeconomic History  ? Marital status: Widowed  ?  Spouse name: Not on file  ? Number of children: Not on file  ? Years of education: Not on file  ? Highest education level: Not on file  ?Occupational History  ?  Comment: disabled  ?Tobacco Use  ? Smoking status: Former  ?  Types: Cigarettes  ?  Quit date: 08/19/1987  ?  Years since quitting: 34.1  ? Smokeless tobacco: Never  ?Vaping Use  ? Vaping Use: Never used  ?Substance and Sexual Activity  ? Alcohol use: No  ? Drug use: No  ? Sexual activity: Not Currently  ?Other Topics Concern  ? Not on file  ?Social History Narrative  ? Patient lives alone, with her dog.  Her older brother who lives an hour away is very helpful for her.  ?  He will bring her to the hospital but she is not sure who will stay with her after surgery.  ? ?Social Determinants of Health  ? ?Financial Resource Strain: Not on file  ?Food Insecurity: Not on file  ?Transportation Needs: Not on file  ?Physical Activity: Not on file  ?Stress: Not on file  ?Social Connections: Not on file  ?Intimate Partner Violence: Not on file  ? ?Family Status  ?Relation Name Status  ? Emelda Brothers great (Not Specified)  ? Mother  (Not Specified)  ? Brother  (Not Specified)  ? Mat Aunt  (Not Specified)  ? Mat Uncle  (Not Specified)  ? MGM  (Not Specified)  ? Neg Hx  (Not Specified)   ? ?Family History  ?Problem Relation Age of Onset  ? Breast cancer Paternal Aunt   ? Ovarian cancer Mother   ? Diabetes Mother   ? Diabetes Brother   ? Diabetes Maternal Aunt   ? Diabetes Maternal Uncle   ? Diabetes Maternal Grandmother   ? Colon cancer Neg Hx   ? ?Allergies  ?Allergen Reactions  ? Avocado Anaphylaxis  ? Aspirin Other (See Comments)  ?  Aspirin and all aspirin products are not compatible with her seizure medications.  ? Divalproex Sodium   ?  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 Hydroxyzine Other (See Comments)  ?  Caused her to go into cluster seizures  ? Lamotrigine Other (See Comments)  ?  Designed for generic epilepsy, not encephalitic  ? Morphine And Related Other (See Comments)  ?  Made her feel like she was floating.  That is how she feels when having a seizure. ?It can cause me to go into a seizure  ? Phenobarbital Other (See Comments)  ?  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 Phenytoin Sodium Extended Other (See Comments)  ?  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 Prednisone Other (See Comments)  ?  Caused her to go into cluster seizures  ? Zoloft [Sertraline] Other (See Comments)  ?  Made seizures worse  ? Other   ? Adhesive [Tape] Other (See Comments)  ?  Welt to skin. Rash ?PAPER TAPE IS OKAY  ? Ativan [Lorazepam] Nausea And Vomiting  ? Carbamazepine Other (See Comments)  ?  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 Codeine Other (See Comments)  ?  "makes me loopy"  ? Iodine Rash  ?  Iodine on the skin causes hives  ? Montelukast Other (See Comments)  ?  Patient was told that it was best that she NOT take this medication  ? Topiramate Other (See Comments)  ?  Did not work for patient  ?  ?Patient Care Team: ?Erasmo Downer, MD as PCP - General (Family Medicine)  ? ?Medications: ?Outpatient Medications Prior to Visit  ?Medication Sig  ? acetaminophen (TYLENOL) 500 MG tablet Take 1,000 mg by mouth every 6 (six) hours as needed (pain.).    ? Cenobamate (XCOPRI) 100 MG TABS Take 300 mg by mouth at bedtime.  ? cetirizine (ZYRTEC) 10 MG tablet TAKE 1 TABLET BY MOUTH ONCE DAILY  ? cholecalciferol (VITAMIN D3) 25 MCG (1000 UNIT) tablet Take 1,000 Units by mouth daily.  ? famotidine (PEPCID) 20 MG tablet Take 20 mg by mouth 2 (two) times daily.  ? levETIRAcetam (KEPPRA) 1000 MG tablet Take 1 tablet (1,000 mg total) by mouth 2 (two) times daily.  ? Melatonin 5 MG CAPS Take 5 mg by mouth at  bedtime.   ? Methsuximide (CELONTIN) 300 MG CAPS Take by mouth.  ? nitrofurantoin (MACRODANTIN) 50 MG capsule TAKE 1 CAPSULE BY MOUTH NIGHTLY  ? oxybutynin (DITROPAN-XL) 10 MG 24 hr tablet TAKE 1 TABLET BY MOUTH EVERY NIGHT AT BEDTIME  ? zonisamide (ZONEGRAN) 100 MG capsule Take 100 mg by mouth in the morning, at noon, and at bedtime.   ? ?No facility-administered medications prior to visit.  ? ? ?Review of Systems  ?HENT:  Positive for congestion, dental problem, sinus pressure and sneezing.   ?Eyes:  Positive for photophobia and itching.  ?Endocrine: Positive for cold intolerance.  ?Musculoskeletal:  Positive for arthralgias, back pain, joint swelling, myalgias and neck pain.  ?Allergic/Immunologic: Positive for environmental allergies.  ?Neurological:  Positive for dizziness, seizures, speech difficulty, light-headedness and headaches.  ? ?Last CBC ?Lab Results  ?Component Value Date  ? WBC 5.5 04/28/2021  ? HGB 13.2 04/28/2021  ? HCT 40.4 04/28/2021  ? MCV 103.9 (H) 04/28/2021  ? MCH 33.9 04/28/2021  ? RDW 13.2 04/28/2021  ? PLT 227 04/28/2021  ? ?Last metabolic panel ?Lab Results  ?Component Value Date  ? GLUCOSE 119 (H) 04/28/2021  ? NA 137 04/28/2021  ? K 4.0 04/28/2021  ? CL 106 04/28/2021  ? CO2 22 04/28/2021  ? BUN 19 04/28/2021  ? CREATININE 1.38 (H) 04/28/2021  ? GFRNONAA 45 (L) 04/28/2021  ? CALCIUM 9.0 04/28/2021  ? PROT 7.4 04/28/2021  ? ALBUMIN 4.2 04/28/2021  ? LABGLOB 2.1 03/22/2021  ? AGRATIO 2.2 03/22/2021  ? BILITOT 0.4 04/28/2021  ? ALKPHOS 158 (H)  04/28/2021  ? AST 37 04/28/2021  ? ALT 38 04/28/2021  ? ANIONGAP 9 04/28/2021  ? ?Last lipids ?Lab Results  ?Component Value Date  ? CHOL 233 (H) 03/22/2021  ? HDL 66 03/22/2021  ? LDLCALC 149 (H) 03/22/2021  ?

## 2021-09-21 NOTE — Assessment & Plan Note (Signed)
F/b Neurology at [facility] ?No changes to AEDs ?

## 2021-09-21 NOTE — Assessment & Plan Note (Signed)
Continue supplement Recheck level 

## 2021-09-21 NOTE — Assessment & Plan Note (Signed)
Chronic and well controlled  ?Continue oxybutinin at current dose ?

## 2021-09-22 LAB — VITAMIN D 25 HYDROXY (VIT D DEFICIENCY, FRACTURES): Vit D, 25-Hydroxy: 29.9 ng/mL — ABNORMAL LOW (ref 30.0–100.0)

## 2021-09-22 LAB — LIPID PANEL
Chol/HDL Ratio: 3.6 ratio (ref 0.0–4.4)
Cholesterol, Total: 237 mg/dL — ABNORMAL HIGH (ref 100–199)
HDL: 66 mg/dL (ref >39)
LDL Chol Calc (NIH): 153 mg/dL — ABNORMAL HIGH (ref 0–99)
Triglycerides: 104 mg/dL (ref 0–149)
VLDL Cholesterol Cal: 18 mg/dL (ref 5–40)

## 2021-09-22 LAB — CBC
Hematocrit: 40.8 % (ref 34.0–46.6)
Hemoglobin: 13.6 g/dL (ref 11.1–15.9)
MCH: 33.4 pg — ABNORMAL HIGH (ref 26.6–33.0)
MCHC: 33.3 g/dL (ref 31.5–35.7)
MCV: 100 fL — ABNORMAL HIGH (ref 79–97)
Platelets: 305 10*3/uL (ref 150–450)
RBC: 4.07 x10E6/uL (ref 3.77–5.28)
RDW: 11.7 % (ref 11.7–15.4)
WBC: 5.3 10*3/uL (ref 3.4–10.8)

## 2021-09-22 LAB — COMPREHENSIVE METABOLIC PANEL
ALT: 30 IU/L (ref 0–32)
AST: 27 IU/L (ref 0–40)
Albumin/Globulin Ratio: 2 (ref 1.2–2.2)
Albumin: 4.5 g/dL (ref 3.8–4.9)
Alkaline Phosphatase: 186 IU/L — ABNORMAL HIGH (ref 44–121)
BUN/Creatinine Ratio: 20 (ref 9–23)
BUN: 21 mg/dL (ref 6–24)
Bilirubin Total: 0.2 mg/dL (ref 0.0–1.2)
CO2: 21 mmol/L (ref 20–29)
Calcium: 9.5 mg/dL (ref 8.7–10.2)
Chloride: 106 mmol/L (ref 96–106)
Creatinine, Ser: 1.03 mg/dL — ABNORMAL HIGH (ref 0.57–1.00)
Globulin, Total: 2.2 g/dL (ref 1.5–4.5)
Glucose: 94 mg/dL (ref 70–99)
Potassium: 4.3 mmol/L (ref 3.5–5.2)
Sodium: 145 mmol/L — ABNORMAL HIGH (ref 134–144)
Total Protein: 6.7 g/dL (ref 6.0–8.5)
eGFR: 63 mL/min/{1.73_m2} (ref >59)

## 2021-09-22 LAB — HEMOGLOBIN A1C
Est. average glucose Bld gHb Est-mCnc: 103 mg/dL
Hgb A1c MFr Bld: 5.2 % (ref 4.8–5.6)

## 2021-09-29 ENCOUNTER — Other Ambulatory Visit: Payer: Self-pay | Admitting: Family Medicine

## 2021-10-06 ENCOUNTER — Other Ambulatory Visit: Payer: Self-pay | Admitting: Family Medicine

## 2021-10-06 DIAGNOSIS — R748 Abnormal levels of other serum enzymes: Secondary | ICD-10-CM

## 2021-10-16 LAB — ALKALINE PHOSPHATASE, ISOENZYMES
Alkaline Phosphatase: 182 IU/L — ABNORMAL HIGH (ref 44–121)
BONE FRACTION: 35 % (ref 14–68)
INTESTINAL FRAC.: 1 % (ref 0–18)
LIVER FRACTION: 64 % (ref 18–85)

## 2021-10-16 LAB — SPECIMEN STATUS REPORT

## 2021-10-21 ENCOUNTER — Ambulatory Visit
Admission: RE | Admit: 2021-10-21 | Discharge: 2021-10-21 | Disposition: A | Discharge status: Home / Self Care | Payer: Medicare Other | Source: Ambulatory Visit | Attending: Family Medicine | Admitting: Family Medicine

## 2021-10-21 DIAGNOSIS — R748 Abnormal levels of other serum enzymes: Secondary | ICD-10-CM | POA: Diagnosis present

## 2021-11-11 ENCOUNTER — Other Ambulatory Visit: Payer: Self-pay | Admitting: Internal Medicine

## 2021-12-08 ENCOUNTER — Other Ambulatory Visit: Payer: Self-pay | Admitting: Family Medicine

## 2021-12-10 ENCOUNTER — Other Ambulatory Visit: Payer: Self-pay | Admitting: Internal Medicine

## 2022-01-25 IMAGING — CT CT HEAD W/O CM
4 series · 17 of 47 positions shown, 19 images · non-contrast
Comparison: Most recent head CT 06/15/2020

CLINICAL DATA: Nonspecific dizziness. Near syncope while walking
dog.

EXAM:
CT HEAD WITHOUT CONTRAST
TECHNIQUE: Contiguous axial images were obtained from the base of the skull
through the vertex without intravenous contrast.

[Series 2: head (person_name) (person_name) · axial · 0.41mm/px · z∈[-183,-68]mm · 7 of 31 slices shown, 9 images]
[im 4/31  brain]
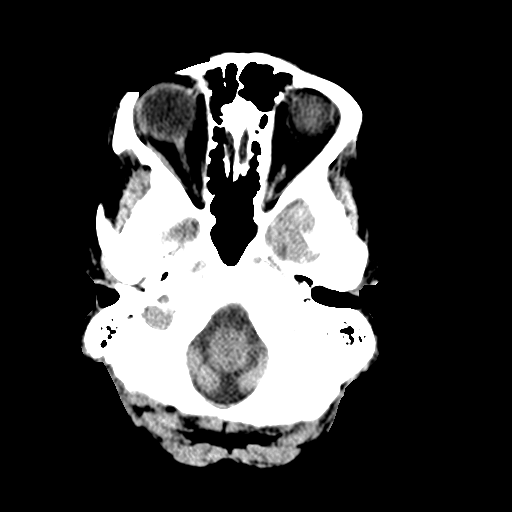
[im 4/31  bone]
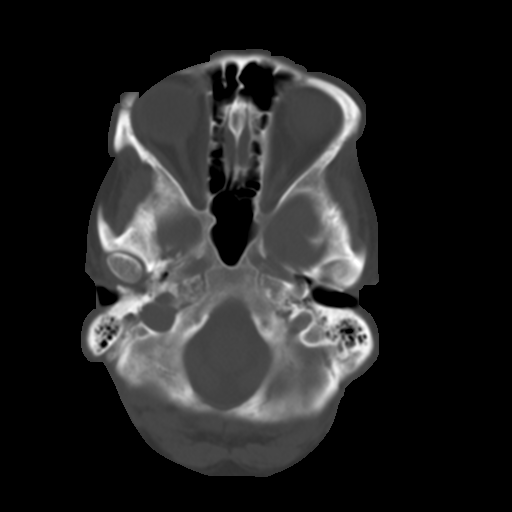
[im 8/31  brain]
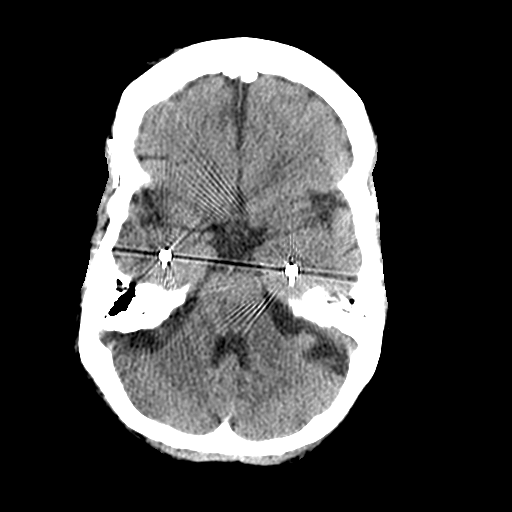
[im 12/31  brain]
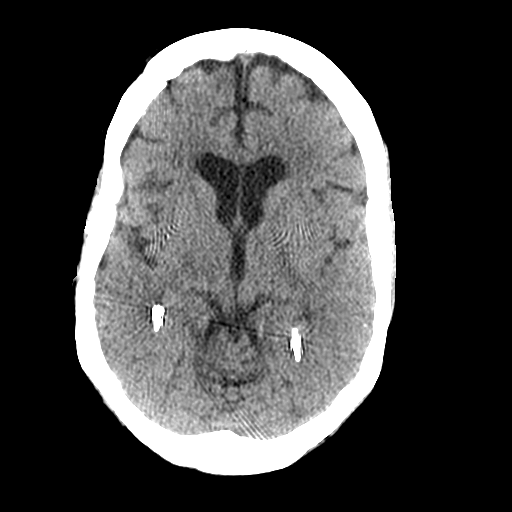
[im 16/31  brain]
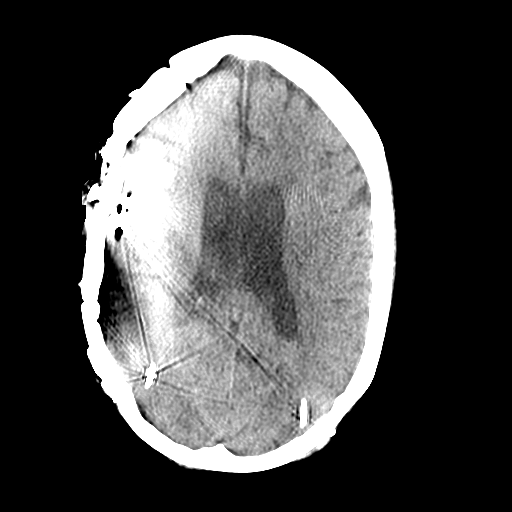
[im 19/31  brain]
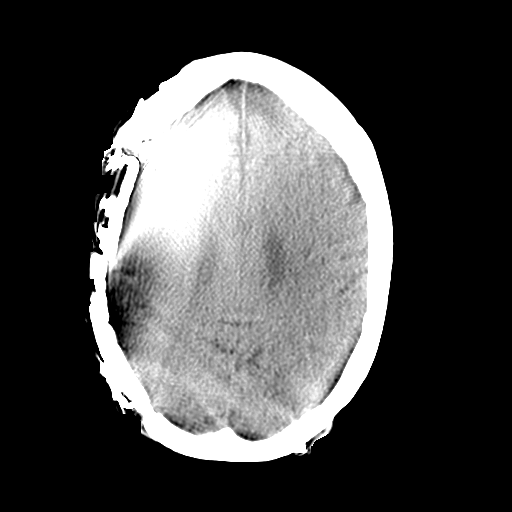
[im 19/31  bone]
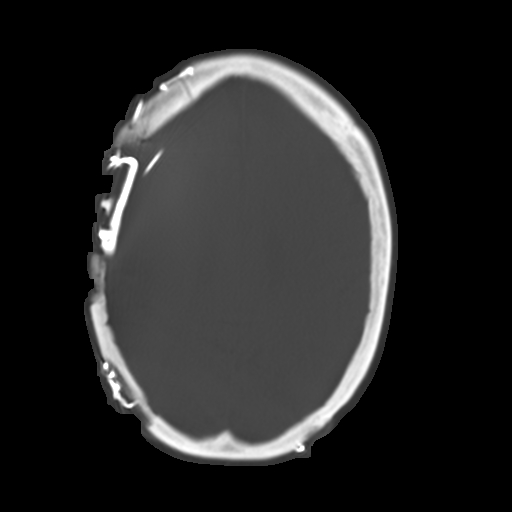
[im 23/31  brain]
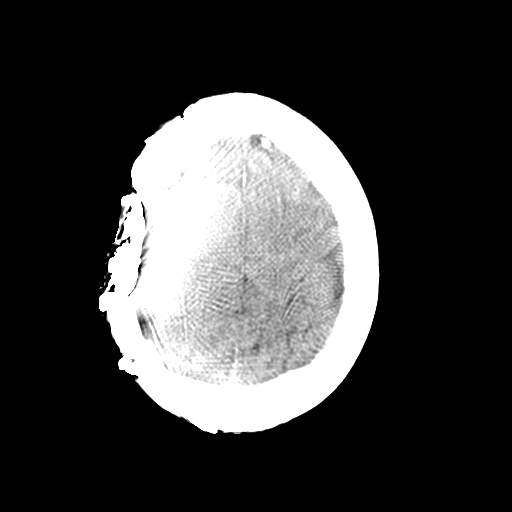
[im 27/31  brain]
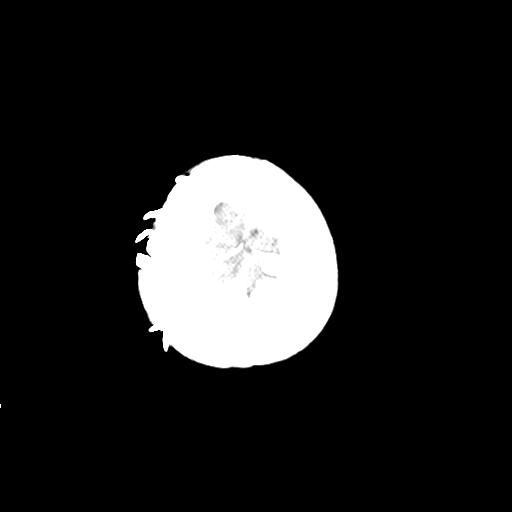

[Series 3: head bone · axial · 0.41mm/px · z∈[-184,-130]mm · 4 of 78 slices shown]
[im 8/78  bone]
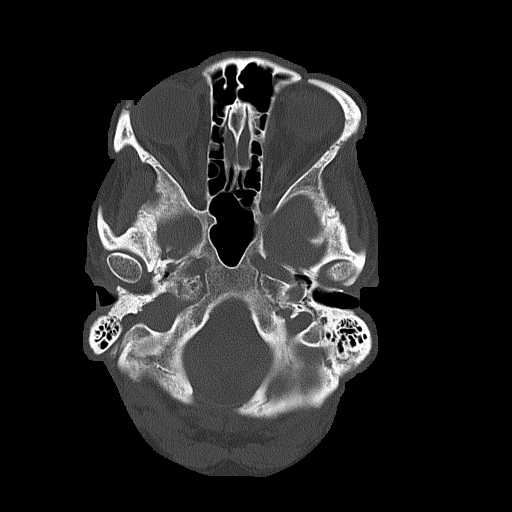
[im 16/78  bone]
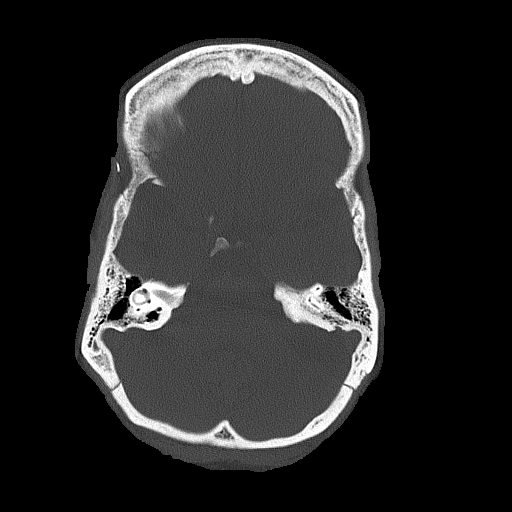
[im 24/78  bone]
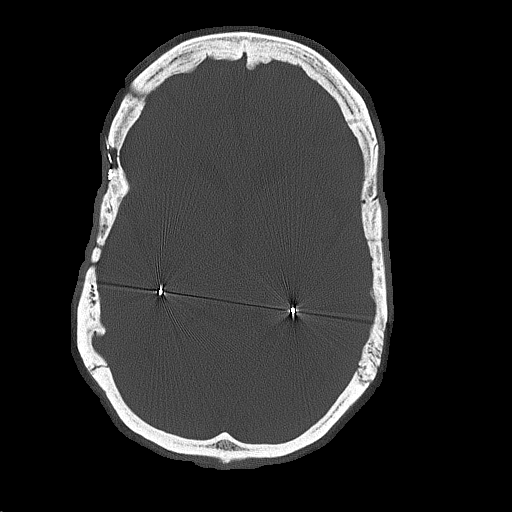
[im 35/78  bone]
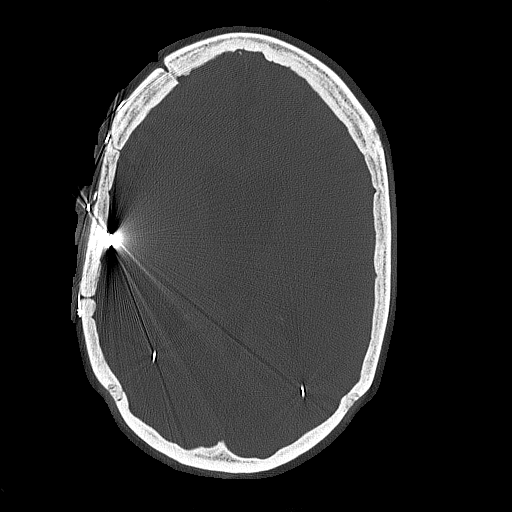

[Series 4: coronal soft tissue · coronal · 0.31mm/px · 3 of 63 slices shown]
[im 21/63  brain]
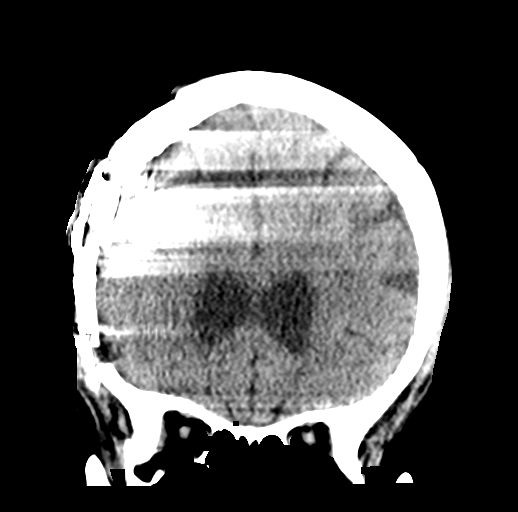
[im 28/63  brain]
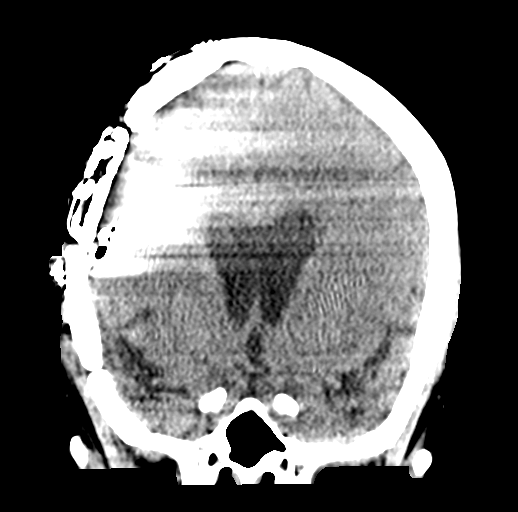
[im 35/63  brain]
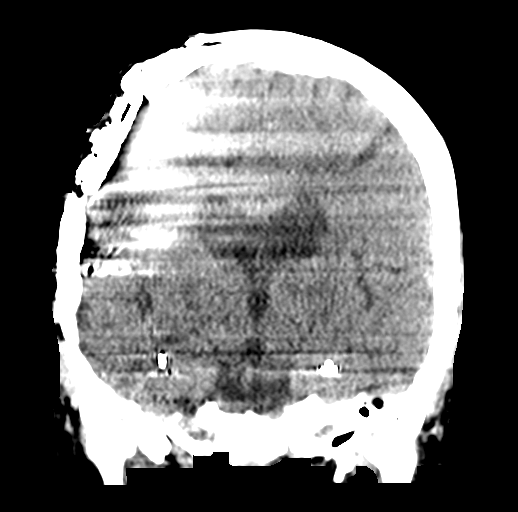

[Series 5: sagittal soft tissue · sagittal · 0.33mm/px · 3 of 54 slices shown]
[im 18/54  brain]
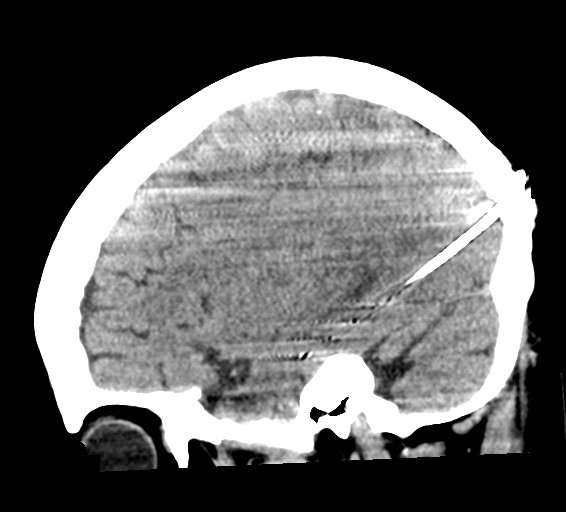
[im 27/54  brain]
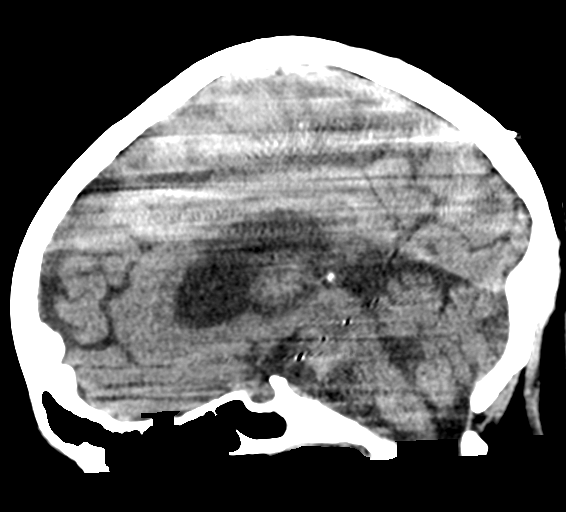
[im 36/54  brain]
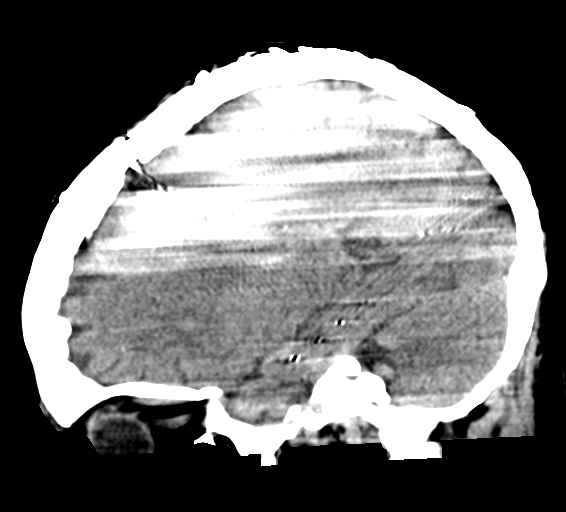

[17 of 47 positions shown; findings below may reference images not displayed]

FINDINGS: Brain: Significant streak artifact throughout the right hemisphere
from metallic calvarial implant. Bilateral deep brain stimulators
are in place, unchanged in position. There is also a stimulator
terminating in the right frontotemporal region. Stable ventricular
size and configuration without hydrocephalus. No evidence of of
hemorrhage or acute ischemia. No extra-axial collection in the
visualized brain parenchyma.

Vascular: No hyperdense vessel.

Skull: Right-sided craniotomy with generator implant causing streak
artifact. No acute findings.

Sinuses/Orbits: No mastoid effusion. Paranasal sinuses are clear.
Bilateral cataract resection

Other: None.
IMPRESSION: 1. Stable head CT without acute findings.
2. Right calvarial postsurgical change with metallic streak artifact
obscuring large portion of the right hemisphere. Stable stimulators.

## 2022-02-09 ENCOUNTER — Telehealth: Payer: Self-pay

## 2022-02-09 NOTE — Telephone Encounter (Signed)
Patient was given number to [facility]. Scheduled for Flu shot. For Covid shot she was advised to go to the pharmacy.

## 2022-02-09 NOTE — Telephone Encounter (Signed)
Copied from CRM [id number]. Topic: General - Inquiry >> Feb 09, 2022  1:24 PM Mandy Deleon wrote: Reason for CRM: Patient called in requested an appointment for a covid vaccine as well as a flu shot. She also would like to schedule a mammogram. Please assist patient further. Patient stated she also had a small fall yesterday and bruised her back and wanted the provider to know.

## 2022-03-09 ENCOUNTER — Ambulatory Visit (INDEPENDENT_AMBULATORY_CARE_PROVIDER_SITE_OTHER): Payer: Medicare Other

## 2022-03-09 DIAGNOSIS — Z23 Encounter for immunization: Secondary | ICD-10-CM

## 2022-03-16 ENCOUNTER — Other Ambulatory Visit: Payer: Self-pay | Admitting: Family Medicine

## 2022-03-16 NOTE — Progress Notes (Signed)
I,Sulibeya S Dimas,acting as a scribe for [name], MD.,have documented all relevant documentation on the behalf of [name], MD,as directed by  [name], MD while in the presence of [name], MD.     Established patient visit   Patient: Mandy Deleon   DOB: 09/01/1965   56 y.o. Female  MRN: [id number] Visit Date: 03/17/2022  Today's healthcare provider: [name], MD   Chief Complaint  Patient presents with   Fall   Gastroesophageal Reflux   Urinary Tract Infection   Subjective    HPI  Patient here today C/O frequent falls due to balance issues. Most recently 2 d ago tripped and fell onto L knee on solid wooden floor.  Having pain in neck and entire back, both sides. Feels muscular. L knee is still painful.  Patient reports she has been having painful urination on and off frequently. Patient is taking OTC generic AZO reports medication does help with symptoms. Patient does take nitrofurantoin daily.  No symptoms currently  Patient C/O heartburn worsening at night time. Patient reports taking OTC generic Maalox reports good symptom control. X several months. No changes in food intake.  Worse with hot and spicy foods  Medications: Outpatient Medications Prior to Visit  Medication Sig   acetaminophen (TYLENOL) 500 MG tablet Take 1,000 mg by mouth every 6 (six) hours as needed (pain.).    alum & mag hydroxide-simeth (MAALOX/MYLANTA) 200-200-20 MG/5ML suspension Take 30 mLs by mouth every 6 (six) hours as needed for indigestion or heartburn.   Cenobamate (XCOPRI) 100 MG TABS Take 300 mg by mouth at bedtime.   cetirizine (ZYRTEC) 10 MG tablet TAKE 1 TABLET BY MOUTH ONCE DAILY   cholecalciferol (VITAMIN D3) 25 MCG (1000 UNIT) tablet Take 1,000 Units by mouth daily.   levETIRAcetam (KEPPRA) 1000 MG tablet Take 1 tablet (1,000 mg total) by mouth 2 (two) times daily.   Melatonin 5 MG CAPS Take 5 mg by mouth at bedtime.    Methsuximide  (CELONTIN) 300 MG CAPS Take by mouth.   nitrofurantoin (MACRODANTIN) 50 MG capsule TAKE 1 CAPSULE BY MOUTH NIGHTLY   oxybutynin (DITROPAN-XL) 10 MG 24 hr tablet TAKE 1 TABLET BY MOUTH EVERY NIGHT AT BEDTIME   phenazopyridine (PYRIDIUM) 95 MG tablet Take 95 mg by mouth 3 (three) times daily as needed for pain.   zonisamide (ZONEGRAN) 100 MG capsule Take 100 mg by mouth in the morning, at noon, and at bedtime.    [DISCONTINUED] famotidine (PEPCID) 20 MG tablet Take 20 mg by mouth 2 (two) times daily.   No facility-administered medications prior to visit.    Review of Systems     Objective    BP 104/71 (BP Location: Left Arm, Patient Position: Sitting, Cuff Size: Large)   Pulse 90   Temp 97.6 F (36.4 C) (Oral)   Resp 16   Wt 178 lb 11.2 oz (81.1 kg)   BMI 31.66 kg/m    Physical Exam Vitals reviewed.  Constitutional:      General: She is not in acute distress.    Appearance: Normal appearance. She is well-developed. She is not diaphoretic.  HENT:     Head: Normocephalic and atraumatic.  Eyes:     General: No scleral icterus.    Conjunctiva/sclera: Conjunctivae normal.  Neck:     Thyroid: No thyromegaly.  Cardiovascular:     Rate and Rhythm: Normal rate and regular rhythm.     Pulses: Normal pulses.     Heart sounds:  Normal heart sounds. No murmur heard. Pulmonary:     Effort: Pulmonary effort is normal. No respiratory distress.     Breath sounds: Normal breath sounds. No wheezing, rhonchi or rales.  Musculoskeletal:     Cervical back: Neck supple.     Right lower leg: No edema.     Left lower leg: No edema.     Comments: Brusiing and TTP on L patella. TTP over tibial tuberosity  Lymphadenopathy:     Cervical: No cervical adenopathy.  Skin:    General: Skin is warm and dry.     Findings: No rash.  Neurological:     Mental Status: She is alert and oriented to person, place, and time. Mental status is at baseline.     Comments: Cautious gait  Psychiatric:         Mood and Affect: Mood normal.        Behavior: Behavior normal.     No results found for any visits on 03/17/22.  Assessment & Plan     Problem List Items Addressed This Visit       Digestive   Gastroesophageal reflux disease without esophagitis - Primary    Longstanding, intermittent problem Discussed dietary choices - handout given Patient prefers to continue prn treatment Consider pepcid vs PPI in the future      Relevant Medications   alum & mag hydroxide-simeth (MAALOX/MYLANTA) 200-200-20 MG/5ML suspension     Other   History of recurrent UTIs    Continue daily Macrobid for prophylaxis Next time she has symptoms, will make appt to check UCx and UA      Acute pain of left knee    2/2 fall Will XRay      Relevant Orders   DG Knee Complete 4 Views Left   Frequent falls    Advised on PT - patient declines Advised on monitoring fall hazards in the home        Return for as scheduled.      I, Lavon Paganini, MD, have reviewed all documentation for this visit. The documentation on 03/17/22 for the exam, diagnosis, procedures, and orders are all accurate and complete.   Kjerstin Abrigo, Dionne Bucy, MD, MPH [facility]

## 2022-03-16 NOTE — Telephone Encounter (Signed)
Requested Prescriptions  Pending Prescriptions Disp Refills  . cetirizine (ZYRTEC) 10 MG tablet [Pharmacy Med Name: CETIRIZINE HCL 10 MG TAB] 90 tablet 0    Sig: TAKE 1 TABLET BY MOUTH ONCE DAILY     Ear, Nose, and Throat:  Antihistamines 2 Failed - 03/16/2022  8:52 AM      Failed - Cr in normal range and within 360 days    Creatinine, Ser  Date Value Ref Range Status  09/21/2021 1.03 (H) 0.57 - 1.00 mg/dL Final         Passed - Valid encounter within last 12 months    Recent Outpatient Visits          5 months ago Encounter for annual wellness visit (AWV) in Medicare patient   [facility], Dionne Bucy, MD   11 months ago Essential hypertension   [facility], Dionne Bucy, MD   1 year ago Encounter for annual wellness visit (AWV) in Medicare patient   [facility], Dionne Bucy, MD   1 year ago Essential hypertension   [facility] [place] [facility], [place]      Future Appointments            Tomorrow Bacigalupo, Dionne Bucy, MD [facility], [place]

## 2022-03-17 ENCOUNTER — Ambulatory Visit
Admission: RE | Admit: 2022-03-17 | Discharge: 2022-03-17 | Disposition: A | Discharge status: Home / Self Care | Payer: Medicare Other | Attending: Family Medicine | Admitting: Family Medicine

## 2022-03-17 ENCOUNTER — Ambulatory Visit (INDEPENDENT_AMBULATORY_CARE_PROVIDER_SITE_OTHER): Payer: Medicare Other | Admitting: Family Medicine

## 2022-03-17 ENCOUNTER — Encounter: Payer: Self-pay | Admitting: Family Medicine

## 2022-03-17 ENCOUNTER — Ambulatory Visit
Admission: RE | Admit: 2022-03-17 | Discharge: 2022-03-17 | Disposition: A | Discharge status: Home / Self Care | Payer: Medicare Other | Source: Ambulatory Visit | Attending: Family Medicine | Admitting: Family Medicine

## 2022-03-17 VITALS — BP 104/71 | HR 90 | Temp 97.6°F | Resp 16 | Wt 178.7 lb

## 2022-03-17 DIAGNOSIS — M25562 Pain in left knee: Secondary | ICD-10-CM | POA: Insufficient documentation

## 2022-03-17 DIAGNOSIS — Z87440 Personal history of urinary (tract) infections: Secondary | ICD-10-CM

## 2022-03-17 DIAGNOSIS — R296 Repeated falls: Secondary | ICD-10-CM | POA: Diagnosis not present

## 2022-03-17 DIAGNOSIS — K219 Gastro-esophageal reflux disease without esophagitis: Secondary | ICD-10-CM | POA: Diagnosis not present

## 2022-03-17 NOTE — Assessment & Plan Note (Signed)
Advised on PT - patient declines Advised on monitoring fall hazards in the home

## 2022-03-17 NOTE — Assessment & Plan Note (Signed)
Continue daily Macrobid for prophylaxis Next time she has symptoms, will make appt to check UCx and UA

## 2022-03-17 NOTE — Assessment & Plan Note (Signed)
2/2 fall Will XRay

## 2022-03-17 NOTE — Assessment & Plan Note (Signed)
Longstanding, intermittent problem Discussed dietary choices - handout given Patient prefers to continue prn treatment Consider pepcid vs PPI in the future

## 2022-03-17 NOTE — Patient Instructions (Addendum)
Can try pepcid OTC if tums or antacids aren't enough

## 2022-03-21 ENCOUNTER — Telehealth: Payer: Self-pay | Admitting: Family Medicine

## 2022-03-21 MED ORDER — ONDANSETRON HCL 4 MG PO TABS: 4.0000 mg | ORAL_TABLET | Freq: Three times a day (TID) | ORAL | 0 refills | Status: DC | PRN

## 2022-03-21 NOTE — Telephone Encounter (Signed)
Medication Refill - Medication: ondansetron (ZOFRAN-ODT) disintegrating tablet 4 mg   Has the patient contacted their pharmacy? Yes.   (Agent: If no, request that the patient contact the pharmacy for the refill. If patient does not wish to contact the pharmacy document the reason why and proceed with request.) (Agent: If yes, when and what did the pharmacy advise?)  Preferred Pharmacy (with phone number or street name):  [facility]  [place] [address]  Phone: [phone] Fax: [phone]   Has the patient been seen for an appointment in the last year OR does the patient have an upcoming appointment? Yes.    Agent: Please be advised that RX refills may take up to 3 business days. We ask that you follow-up with your pharmacy.

## 2022-04-12 ENCOUNTER — Telehealth: Payer: Self-pay

## 2022-04-12 NOTE — Telephone Encounter (Signed)
Copied from [place] [id number]. Topic: General - Other >> Apr 12, 2022  9:31 AM Ludger Nutting wrote: Patient states that she had a seizure on 04/05/22. Patient states she contacted her neurologist and they moved her appointment from January to 04/20/22. Patient states she bruised her face but no other injuries.

## 2022-04-12 NOTE — Telephone Encounter (Signed)
Noted  

## 2022-06-28 ENCOUNTER — Other Ambulatory Visit: Payer: Self-pay | Admitting: Family Medicine

## 2022-07-08 NOTE — Progress Notes (Unsigned)
I,Havish Petties S Ellery Meroney,acting as a scribe for Lavon Paganini, MD.,have documented all relevant documentation on the behalf of Lavon Paganini, MD,as directed by  Lavon Paganini, MD while in the presence of Lavon Paganini, MD.     Established patient visit   Patient: Mandy Deleon   DOB: Feb 20, 1966   57 y.o. Female  MRN: [id number] Visit Date: 07/11/2022  Today's healthcare provider: Lavon Paganini, MD   No chief complaint on file.  Subjective    HPI  Patient reports she was advised her dentist to have her throat checked. She reports they told her on her left side it looked like a tonsil. Patient reports she had her tonsils removed years ago.  Patient denies any fever, chills or sore throat. She reports having allergies and does have a some cough and runny nose.   Throat feels a little sore and scratchy  Medications: Outpatient Medications Prior to Visit  Medication Sig   acetaminophen (TYLENOL) 500 MG tablet Take 1,000 mg by mouth every 6 (six) hours as needed (pain.).    alum & mag hydroxide-simeth (MAALOX/MYLANTA) 200-200-20 MG/5ML suspension Take 30 mLs by mouth every 6 (six) hours as needed for indigestion or heartburn.   Cenobamate (XCOPRI) 100 MG TABS Take by mouth at bedtime. 100mg  every morning, 200mg  qhs, except 300mg  qhs on Wed   cetirizine (ZYRTEC) 10 MG tablet TAKE 1 TABLET BY MOUTH ONCE DAILY   cholecalciferol (VITAMIN D3) 25 MCG (1000 UNIT) tablet Take 1,000 Units by mouth daily.   levETIRAcetam (KEPPRA) 1000 MG tablet Take 1 tablet (1,000 mg total) by mouth 2 (two) times daily.   Melatonin 5 MG CAPS Take 5 mg by mouth at bedtime.    Methsuximide (CELONTIN) 300 MG CAPS Take by mouth. Does not take on T/Th/Sat/Sun   nitrofurantoin (MACRODANTIN) 50 MG capsule TAKE 1 CAPSULE BY MOUTH NIGHTLY   ondansetron (ZOFRAN) 4 MG tablet Take 1 tablet (4 mg total) by mouth every 8 (eight) hours as needed for nausea or vomiting.   oxybutynin (DITROPAN-XL) 10 MG 24 hr  tablet TAKE 1 TABLET BY MOUTH EVERY NIGHT AT BEDTIME   phenazopyridine (PYRIDIUM) 95 MG tablet Take 95 mg by mouth 3 (three) times daily as needed for pain.   zonisamide (ZONEGRAN) 100 MG capsule Take 100 mg by mouth in the morning, at noon, and at bedtime.    No facility-administered medications prior to visit.    Review of Systems     Objective    BP 105/68 (BP Location: Left Arm, Patient Position: Sitting, Cuff Size: Large)   Pulse 78   Temp 97.7 F (36.5 C) (Temporal)   Resp 16   Wt 180 lb (81.6 kg)   BMI 31.89 kg/m    Physical Exam Vitals reviewed.  Constitutional:      General: She is not in acute distress.    Appearance: Normal appearance. She is well-developed.  HENT:     Head: Normocephalic and atraumatic.     Mouth/Throat:     Mouth: Mucous membranes are moist.     Pharynx: Oropharynx is clear. No oropharyngeal exudate.     Comments: Tonsils surgically absent Eyes:     General: No scleral icterus.    Conjunctiva/sclera: Conjunctivae normal.  Cardiovascular:     Rate and Rhythm: Normal rate and regular rhythm.  Pulmonary:     Effort: Pulmonary effort is normal. No respiratory distress.  Skin:    General: Skin is warm and dry.     Findings: No rash.  Neurological:     Mental Status: She is alert and oriented to person, place, and time.  Psychiatric:        Behavior: Behavior normal.       No results found for any visits on 07/11/22.  Assessment & Plan     1. Worried well - tonsils are surgically absent - reassurance given - can add flonase to antihistamine if having some PND  No follow-ups on file.      Total time spent on today's visit was greater than 15 minutes, including both face-to-face time and nonface-to-face time personally spent on review of chart, answering patient's questions, and coordinating care.   I, Lavon Paganini, MD, have reviewed all documentation for this visit. The documentation on 07/11/22 for the exam, diagnosis,  procedures, and orders are all accurate and complete.   Bacigalupo, Dionne Bucy, MD, MPH [facility]

## 2022-07-11 ENCOUNTER — Ambulatory Visit (INDEPENDENT_AMBULATORY_CARE_PROVIDER_SITE_OTHER): Payer: Medicare Other | Admitting: Family Medicine

## 2022-07-11 ENCOUNTER — Encounter: Payer: Self-pay | Admitting: Family Medicine

## 2022-07-11 VITALS — BP 105/68 | HR 78 | Temp 97.7°F | Resp 16 | Wt 180.0 lb

## 2022-07-11 DIAGNOSIS — Z711 Person with feared health complaint in whom no diagnosis is made: Secondary | ICD-10-CM | POA: Diagnosis not present

## 2022-08-16 ENCOUNTER — Other Ambulatory Visit: Payer: Self-pay | Admitting: Family Medicine

## 2022-09-23 ENCOUNTER — Ambulatory Visit (INDEPENDENT_AMBULATORY_CARE_PROVIDER_SITE_OTHER): Payer: Medicare Other | Admitting: Family Medicine

## 2022-09-23 ENCOUNTER — Encounter: Payer: Self-pay | Admitting: Family Medicine

## 2022-09-23 VITALS — BP 98/70 | HR 77 | Temp 97.9°F | Resp 12 | Ht 63.0 in | Wt 185.5 lb

## 2022-09-23 DIAGNOSIS — Z1231 Encounter for screening mammogram for malignant neoplasm of breast: Secondary | ICD-10-CM

## 2022-09-23 DIAGNOSIS — I10 Essential (primary) hypertension: Secondary | ICD-10-CM | POA: Diagnosis not present

## 2022-09-23 DIAGNOSIS — M25512 Pain in left shoulder: Secondary | ICD-10-CM | POA: Insufficient documentation

## 2022-09-23 DIAGNOSIS — Z79899 Other long term (current) drug therapy: Secondary | ICD-10-CM

## 2022-09-23 DIAGNOSIS — E559 Vitamin D deficiency, unspecified: Secondary | ICD-10-CM | POA: Diagnosis not present

## 2022-09-23 DIAGNOSIS — Z0000 Encounter for general adult medical examination without abnormal findings: Secondary | ICD-10-CM

## 2022-09-23 MED ORDER — FLUTICASONE PROPIONATE 50 MCG/ACT NA SUSP: 2.0000 | Freq: Every day | NASAL | 6 refills | Status: AC

## 2022-09-23 NOTE — Assessment & Plan Note (Signed)
Well controlled ?Lifestyle management, not on meds ?Recheck metabolic panel ?

## 2022-09-23 NOTE — Assessment & Plan Note (Signed)
New problem May have had an injury during a fall related to a seizure activity, but cannot be sure Has been bothersome for a few months now No frozen shoulder, but her range of motion is limited by pain She does have some loss of rotator cuff strength in external rotation She does also have some signs of impingement with positive Hawkin's test Encouraged patient to see orthopedics for further evaluation management Referral placed today Continue Tylenol as needed May ice shoulder as needed as well May need to consider PT in the future

## 2022-09-23 NOTE — Progress Notes (Signed)
Mandy Deleon,Sulibeya S Dimas,acting as a Neurosurgeon for Mandy Latch, MD.,have documented all relevant documentation on the behalf of Mandy Latch, MD,as directed by  Mandy Latch, MD while in the presence of Mandy Latch, MD.    Annual Wellness Visit     Patient: Mandy Deleon, Female    DOB: 1966-04-14, 57 y.o.   MRN: [id number] Visit Date: 09/23/2022  Today's Provider: [name], MD   Chief Complaint  Patient presents with   Medicare Wellness   Subjective    Mandy Deleon is a 57 y.o. female who presents today for her Annual Wellness Visit. She reports consuming a general diet. The patient does not participate in regular exercise at present. She generally feels fairly well. She reports sleeping well. She does have additional problems to discuss today.   HPI  L shoulder pain - pain with abduction, flexion and internal rotation hurting for a few months, unknown if injury - has epilepsy and can't be sure if she hurt it during a seizure. X couple of month. Tried heating pad - no improvement. No change. Not stuck, just painful  Having more allergies, with PND and sinus congestion. Using delsym for cough, zyrtec  Medications: Outpatient Medications Prior to Visit  Medication Sig   acetaminophen (TYLENOL) 500 MG tablet Take 1,000 mg by mouth every 6 (six) hours as needed (pain.).    Cenobamate (XCOPRI) 100 MG TABS Take by mouth at bedtime. 100mg  every morning, 200mg  qhs, except 300mg  qhs on Wed   cetirizine (ZYRTEC) 10 MG tablet TAKE ONE TABLET BY MOUTH ONCE DAILY   cholecalciferol (VITAMIN D3) 25 MCG (1000 UNIT) tablet Take 1,000 Units by mouth daily.   Fish Oil-Cholecalciferol (OMEGA-3 + D PO) Take 1 capsule by mouth daily in the afternoon.   levETIRAcetam (KEPPRA) 1000 MG tablet Take 1 tablet (1,000 mg total) by mouth 2 (two) times daily. (Patient taking differently: Take 750 mg by mouth 2 (two) times daily.)   Melatonin 5 MG CAPS Take 5 mg by mouth at bedtime.     Methsuximide (CELONTIN) 300 MG CAPS Take by mouth. Does not take on T/Th/Sat/Sun   ondansetron (ZOFRAN) 4 MG tablet Take 1 tablet (4 mg total) by mouth every 8 (eight) hours as needed for nausea or vomiting.   oxybutynin (DITROPAN-XL) 10 MG 24 hr tablet TAKE 1 TABLET BY MOUTH EVERY NIGHT AT BEDTIME   phenazopyridine (PYRIDIUM) 95 MG tablet Take 95 mg by mouth 3 (three) times daily as needed for pain.   Phenylephrine-DM-GG-APAP (DELSYM COUGH/COLD DAYTIME PO) Take by mouth.   zonisamide (ZONEGRAN) 100 MG capsule Take 100 mg by mouth in the morning, at noon, and at bedtime.    [DISCONTINUED] alum & mag hydroxide-simeth (MAALOX/MYLANTA) 200-200-20 MG/5ML suspension Take 30 mLs by mouth every 6 (six) hours as needed for indigestion or heartburn.   [DISCONTINUED] nitrofurantoin (MACRODANTIN) 50 MG capsule TAKE 1 CAPSULE BY MOUTH NIGHTLY   No facility-administered medications prior to visit.    Allergies  Allergen Reactions   Avocado Anaphylaxis   Aspirin Other (See Comments)    Aspirin and all aspirin products are not compatible with her seizure medications.   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  That is how she feels when having a seizure. 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   Respiratory:  Positive for cough and chest tightness.   Gastrointestinal:  Positive for nausea and vomiting.  Musculoskeletal:  Positive for arthralgias, back pain, myalgias, neck pain and neck stiffness.  Allergic/Immunologic: Positive for environmental allergies and food allergies.  Neurological:  Positive for dizziness, seizures and headaches.  All other systems reviewed and are negative.   Last CBC Lab Results  Component Value Date   WBC 5.3 09/21/2021   HGB 13.6 09/21/2021   HCT 40.8 09/21/2021   MCV 100 (H) 09/21/2021   MCH 33.4 (H) 09/21/2021   RDW 11.7 09/21/2021   PLT 305 09/21/2021   Last metabolic panel Lab Results  Component Value Date   GLUCOSE 94 09/21/2021   NA 145 (H) 09/21/2021   K 4.3 09/21/2021   CL 106 09/21/2021   CO2 21 09/21/2021   BUN 21 09/21/2021   CREATININE 1.03 (H) 09/21/2021   EGFR 63 09/21/2021   CALCIUM 9.5 09/21/2021   PROT 6.7 09/21/2021   ALBUMIN 4.5 09/21/2021   LABGLOB 2.2 09/21/2021   AGRATIO 2.0 09/21/2021   BILITOT 0.2 09/21/2021   ALKPHOS 186 (H) 09/21/2021   ALKPHOS 182 (H) 09/21/2021   AST 27 09/21/2021   ALT 30 09/21/2021   ANIONGAP 9 04/28/2021   Last lipids Lab Results  Component Value Date   CHOL 237 (H) 09/21/2021   HDL 66 09/21/2021   LDLCALC 153 (H) 09/21/2021   TRIG 104 09/21/2021   CHOLHDL 3.6 09/21/2021   Last hemoglobin A1c Lab Results  Component Value Date    HGBA1C 5.2 09/21/2021   Last vitamin D Lab Results  Component Value Date   VD25OH 29.9 (L) 09/21/2021    Objective    Vitals: BP 98/70 (BP Location: Left Arm, Patient Position: Sitting, Cuff Size: Large)   Pulse 77   Temp 97.9 F (36.6 C) (Temporal)   Resp 12   Ht 5\' 3"  (1.6 m)   Wt 185 lb 8 oz (84.1 kg)   SpO2 100%   BMI 32.86 kg/m  BP Readings from Last 3 Encounters:  09/23/22 98/70  07/11/22 105/68  03/17/22 104/71   Wt Readings from Last 3 Encounters:  09/23/22 185 lb 8 oz (84.1 kg)  07/11/22 180  lb (81.6 kg)  03/17/22 178 lb 11.2 oz (81.1 kg)       Physical Exam Vitals reviewed.  Constitutional:      General: She is not in acute distress.    Appearance: Normal appearance. She is well-developed. She is not diaphoretic.  HENT:     Head: Normocephalic and atraumatic.     Right Ear: Tympanic membrane, ear canal and external ear normal.     Left Ear: Tympanic membrane, ear canal and external ear normal.     Nose: Nose normal.     Mouth/Throat:     Mouth: Mucous membranes are moist.     Pharynx: Oropharynx is clear. No oropharyngeal exudate.  Eyes:     General: No scleral icterus.    Conjunctiva/sclera: Conjunctivae normal.     Pupils: Pupils are equal, round, and reactive to light.  Neck:     Thyroid: No thyromegaly.  Cardiovascular:     Rate and Rhythm: Normal rate and regular rhythm.     Pulses: Normal pulses.     Heart sounds: Normal heart sounds. No murmur heard. Pulmonary:     Effort: Pulmonary effort is normal. No respiratory distress.     Breath sounds: Normal breath sounds. No wheezing or rales.  Abdominal:     General: There is no distension.     Palpations: Abdomen is soft.     Tenderness: There is no abdominal tenderness.  Musculoskeletal:        General: No deformity.     Cervical back: Neck supple.     Right lower leg: No edema.     Left lower leg: No edema.     Comments: L Shoulder: Inspection reveals no abnormalities, atrophy or  asymmetry. Palpation is normal with no tenderness over AC joint or bicipital groove. ROM testing is limited by pain Rotator cuff strength normal throughout ,except for external rotation Positive Hawkin's tests   Lymphadenopathy:     Cervical: No cervical adenopathy.  Skin:    General: Skin is warm and dry.     Findings: No rash.  Neurological:     Mental Status: She is alert and oriented to person, place, and time. Mental status is at baseline.     Gait: Gait normal.  Psychiatric:        Mood and Affect: Mood normal.        Behavior: Behavior normal.        Thought Content: Thought content normal.      Most recent functional status assessment:    09/23/2022   10:44 AM  In your present state of health, do you have any difficulty performing the following activities:  Hearing? 0  Vision? 1  Difficulty concentrating or making decisions? 1  Walking or climbing stairs? 1  Dressing or bathing? 1  Doing errands, shopping? 1   Most recent fall risk assessment:    09/23/2022   10:43 AM  Fall Risk   Falls in the past year? 1  Number falls in past yr: 1  Injury with Fall? 1  Risk for fall due to : History of fall(s)  Follow up Falls evaluation completed;Education provided;Falls prevention discussed    Most recent depression screenings:    09/23/2022   10:43 AM 07/11/2022    4:26 PM  PHQ 2/9 Scores  PHQ - 2 Score 0 0  PHQ- 9 Score 0 1   Most recent cognitive screening:     No data to display  Most recent Audit-C alcohol use screening    09/23/2022   10:44 AM  Alcohol Use Disorder Test (AUDIT)  1. How often do you have a drink containing alcohol? 0  2. How many drinks containing alcohol do you have on a typical day when you are drinking? 0  3. How often do you have six or more drinks on one occasion? 0  AUDIT-C Score 0   A score of 3 or more in women, and 4 or more in men indicates increased risk for alcohol abuse, EXCEPT if all of the points are from  question 1   No results found for any visits on 09/23/22.  Assessment & Plan     Annual wellness visit done today including the all of the following: Reviewed patient's Family Medical History Reviewed and updated list of patient's medical providers Assessment of cognitive impairment was done Assessed patient's functional ability Established a written schedule for health screening services Health Risk Assessent Completed and Reviewed  Exercise Activities and Dietary recommendations  Goals   None     Immunization History  Administered Date(s) Administered   Influenza, Seasonal, Injecte, Preservative Fre 04/20/2007, 03/19/2009, 03/04/2011, 03/07/2012, 03/26/2013   Influenza,inj,Quad PF,6+ Mos 03/20/2014, 03/13/2015, 03/14/2016, 03/10/2017, 03/07/2018, 03/29/2019, 03/24/2020, 03/22/2021, 03/09/2022   Moderna Covid-19 Vaccine Bivalent Booster 74yrs & up 04/20/2021, 02/11/2022   PFIZER(Purple Top)SARS-COV-2 Vaccination 08/28/2019, 09/18/2019, 05/05/2020   Tdap 01/19/2005, 01/04/2011    Health Maintenance  Topic Date Due   Zoster Vaccines- Shingrix (1 of 2) Never done   DTaP/Tdap/Td (3 - Td or Tdap) 01/03/2021   MAMMOGRAM  03/24/2022   COVID-19 Vaccine (6 - 2023-24 season) 04/08/2022   INFLUENZA VACCINE  01/05/2023   Medicare Annual Wellness (AWV)  09/23/2023   COLONOSCOPY (Pts 45-69yrs Insurance coverage will need to be confirmed)  04/22/2025   PAP SMEAR-Modifier  07/21/2025   Hepatitis C Screening  Completed   HIV Screening  Completed   HPV VACCINES  Aged Out     Discussed health benefits of physical activity, and encouraged her to engage in regular exercise appropriate for her age and condition.    Problem List Items Addressed This Visit       Cardiovascular and Mediastinum   Essential hypertension    Well controlled Lifestyle management, not on meds Recheck metabolic panel      Relevant Orders   Comprehensive metabolic panel   Lipid panel     Other    Avitaminosis D    Continue supplement Recheck level       Relevant Orders   VITAMIN D 25 Hydroxy (Vit-D Deficiency, Fractures)   Acute pain of left shoulder    New problem May have had an injury during a fall related to a seizure activity, but cannot be sure Has been bothersome for a few months now No frozen shoulder, but her range of motion is limited by pain She does have some loss of rotator cuff strength in external rotation She does also have some signs of impingement with positive Hawkin's test Encouraged patient to see orthopedics for further evaluation management Referral placed today Continue Tylenol as needed May ice shoulder as needed as well May need to consider PT in the future      Relevant Orders   Ambulatory referral to Orthopedics   Other Visit Diagnoses     Encounter for annual wellness visit (AWV) in Medicare patient    -  Primary   Breast cancer screening by mammogram  Relevant Orders   MM 3D SCREENING MAMMOGRAM BILATERAL BREAST   Long-term use of high-risk medication       Relevant Orders   Comprehensive metabolic panel   VITAMIN D 25 Hydroxy (Vit-D Deficiency, Fractures)   Hemoglobin A1c   CBC   TSH   Lipid panel        Return in about 1 year (around 09/23/2023) for AWV.    Mandy Deleon, Mandy Latch, MD, have reviewed all documentation for this visit. The documentation on 09/23/22 for the exam, diagnosis, procedures, and orders are all accurate and complete.   Raynor Calcaterra, Marzella Schlein, MD, MPH [facility]

## 2022-09-23 NOTE — Assessment & Plan Note (Signed)
Continue supplement Recheck level 

## 2022-09-23 NOTE — Patient Instructions (Addendum)
Call [facility] to schedule a mammogram [phone]    Check at the pharmacy for a tetanus shot and shingrix shots

## 2022-09-24 LAB — COMPREHENSIVE METABOLIC PANEL
ALT: 24 IU/L (ref 0–32)
AST: 20 IU/L (ref 0–40)
Albumin/Globulin Ratio: 2.2 (ref 1.2–2.2)
Albumin: 4.1 g/dL (ref 3.8–4.9)
Alkaline Phosphatase: 163 IU/L — ABNORMAL HIGH (ref 44–121)
BUN/Creatinine Ratio: 21 (ref 9–23)
BUN: 22 mg/dL (ref 6–24)
Bilirubin Total: <0.2 mg/dL (ref 0.0–1.2)
CO2: 23 mmol/L (ref 20–29)
Calcium: 9.5 mg/dL (ref 8.7–10.2)
Chloride: 108 mmol/L — ABNORMAL HIGH (ref 96–106)
Creatinine, Ser: 1.05 mg/dL — ABNORMAL HIGH (ref 0.57–1.00)
Globulin, Total: 1.9 g/dL (ref 1.5–4.5)
Glucose: 89 mg/dL (ref 70–99)
Potassium: 5.2 mmol/L (ref 3.5–5.2)
Sodium: 142 mmol/L (ref 134–144)
Total Protein: 6 g/dL (ref 6.0–8.5)
eGFR: 62 mL/min/{1.73_m2} (ref >59)

## 2022-09-24 LAB — CBC
Hematocrit: 39.6 % (ref 34.0–46.6)
Hemoglobin: 13.2 g/dL (ref 11.1–15.9)
MCH: 33 pg (ref 26.6–33.0)
MCHC: 33.3 g/dL (ref 31.5–35.7)
MCV: 99 fL — ABNORMAL HIGH (ref 79–97)
Platelets: 254 10*3/uL (ref 150–450)
RBC: 4 x10E6/uL (ref 3.77–5.28)
RDW: 12 % (ref 11.7–15.4)
WBC: 5.5 10*3/uL (ref 3.4–10.8)

## 2022-09-24 LAB — LIPID PANEL
Chol/HDL Ratio: 3.2 ratio (ref 0.0–4.4)
Cholesterol, Total: 215 mg/dL — ABNORMAL HIGH (ref 100–199)
HDL: 67 mg/dL (ref >39)
LDL Chol Calc (NIH): 126 mg/dL — ABNORMAL HIGH (ref 0–99)
Triglycerides: 123 mg/dL (ref 0–149)
VLDL Cholesterol Cal: 22 mg/dL (ref 5–40)

## 2022-09-24 LAB — HEMOGLOBIN A1C
Est. average glucose Bld gHb Est-mCnc: 108 mg/dL
Hgb A1c MFr Bld: 5.4 % (ref 4.8–5.6)

## 2022-09-24 LAB — TSH: TSH: 1.96 u[IU]/mL (ref 0.450–4.500)

## 2022-09-24 LAB — VITAMIN D 25 HYDROXY (VIT D DEFICIENCY, FRACTURES): Vit D, 25-Hydroxy: 41.5 ng/mL (ref 30.0–100.0)

## 2022-09-29 ENCOUNTER — Other Ambulatory Visit: Payer: Self-pay | Admitting: Family Medicine

## 2022-09-29 NOTE — Telephone Encounter (Signed)
Unable to refill per protocol, Rx request is too soon. Last refill 06/28/22 for 90 and 1 refill.  Requested Prescriptions  Pending Prescriptions Disp Refills   oxybutynin (DITROPAN-XL) 10 MG 24 hr tablet [Pharmacy Med Name: OXYBUTYNIN CHLORIDE ER  ER TABLET ER 24HR] 90 tablet 1    Sig: TAKE ONE TABLET BY MOUTH EVERY NIGHT AT BEDTIME     Urology:  Bladder Agents Passed - 09/29/2022  2:09 PM      Passed - Valid encounter within last 12 months    Recent Outpatient Visits           6 days ago Encounter for annual wellness visit (AWV) in Medicare patient   [facility] [place], Marzella Schlein, MD   2 months ago Worried well   [facility] [place], Marzella Schlein, MD   6 months ago Gastroesophageal reflux disease without esophagitis   [facility] [place], Marzella Schlein, MD   1 year ago Encounter for annual wellness visit (AWV) in Medicare patient   [facility] [place], Marzella Schlein, MD   1 year ago Essential hypertension   [facility] [place], Marzella Schlein, MD

## 2022-10-19 ENCOUNTER — Ambulatory Visit
Admission: RE | Admit: 2022-10-19 | Discharge: 2022-10-19 | Disposition: A | Discharge status: Home / Self Care | Payer: Medicare Other | Source: Ambulatory Visit | Attending: Family Medicine | Admitting: Family Medicine

## 2022-10-19 DIAGNOSIS — Z1231 Encounter for screening mammogram for malignant neoplasm of breast: Secondary | ICD-10-CM | POA: Insufficient documentation

## 2022-10-21 ENCOUNTER — Other Ambulatory Visit: Payer: Self-pay | Admitting: Family Medicine

## 2022-10-21 DIAGNOSIS — R921 Mammographic calcification found on diagnostic imaging of breast: Secondary | ICD-10-CM

## 2022-10-21 DIAGNOSIS — R928 Other abnormal and inconclusive findings on diagnostic imaging of breast: Secondary | ICD-10-CM

## 2022-10-24 ENCOUNTER — Ambulatory Visit
Admission: RE | Admit: 2022-10-24 | Discharge: 2022-10-24 | Disposition: A | Discharge status: Home / Self Care | Payer: Medicare Other | Source: Ambulatory Visit | Attending: Family Medicine | Admitting: Family Medicine

## 2022-10-24 ENCOUNTER — Other Ambulatory Visit: Payer: Self-pay | Admitting: Family Medicine

## 2022-10-24 DIAGNOSIS — R928 Other abnormal and inconclusive findings on diagnostic imaging of breast: Secondary | ICD-10-CM

## 2022-10-24 DIAGNOSIS — R921 Mammographic calcification found on diagnostic imaging of breast: Secondary | ICD-10-CM

## 2022-10-25 ENCOUNTER — Ambulatory Visit: Payer: Self-pay | Admitting: *Deleted

## 2022-10-25 NOTE — Telephone Encounter (Signed)
Patient advised as below.  

## 2022-10-25 NOTE — Telephone Encounter (Signed)
Message from Arther Dames sent at 10/25/2022  1:47 PM EDT  Summary: medication question   Patient states that she is having a breast biopsy done on 5/24. Patient wants to know if she is able to take her medication that morning.          Call History   Type Contact Phone/Fax User  10/25/2022 01:45 PM EDT Phone (Incoming) Toney Rakes "Orlie Pollen" (Self) [phone] (H) Mabe, Hennie Duos   Reason for Disposition  [1] Caller has URGENT medicine question about med that PCP or specialist prescribed AND [2] triager unable to answer question  Answer Assessment - Initial Assessment Questions 1. NAME of MEDICINE: "What medicine(s) are you calling about?"     See pt's message  2. QUESTION: "What is your question?" (e.g., double dose of medicine, side effect)     She's for a breast biopsy on  10/28/2022.   Pt. Wants to know if she is able to take her medication that morning. 3. PRESCRIBER: "Who prescribed the medicine?" Reason: if prescribed by specialist, call should be referred to that group.     Dr. Beryle Flock. 4. SYMPTOMS: "Do you have any symptoms?" If Yes, ask: "What symptoms are you having?"  "How bad are the symptoms (e.g., mild, moderate, severe)     N/A 5. PREGNANCY:  "Is there any chance that you are pregnant?" "When was your last menstrual period?"     N/A  Protocols used: Medication Question Call-A-AH

## 2022-10-25 NOTE — Telephone Encounter (Signed)
Ok to take her prescribed medications. Hold any aspirin or NSAIDs if she is taking them OTC

## 2022-10-25 NOTE — Telephone Encounter (Signed)
  Chief Complaint: See pt's question regarding taking her medication the morning of her breast biopsy on 10/28/2022. Symptoms: N/A Frequency: N/A Pertinent Negatives: Patient denies N/A Disposition: [] ED /[] Urgent Care (no appt availability in office) / [] Appointment(In office/virtual)/ []  [facility]/ [] Home Care/ [] Refused Recommended Disposition /[] [facility]/ [x]  Follow-up with PCP Additional Notes: Message sent to [facility].

## 2022-10-28 ENCOUNTER — Ambulatory Visit
Admission: RE | Admit: 2022-10-28 | Discharge: 2022-10-28 | Disposition: A | Discharge status: Home / Self Care | Payer: Medicare Other | Source: Ambulatory Visit | Attending: Family Medicine | Admitting: Family Medicine

## 2022-10-28 DIAGNOSIS — R928 Other abnormal and inconclusive findings on diagnostic imaging of breast: Secondary | ICD-10-CM | POA: Insufficient documentation

## 2022-10-28 DIAGNOSIS — R921 Mammographic calcification found on diagnostic imaging of breast: Secondary | ICD-10-CM | POA: Insufficient documentation

## 2022-10-28 HISTORY — PX: BREAST BIOPSY: SHX20

## 2022-10-28 MED ORDER — LIDOCAINE-EPINEPHRINE 1 %-1:100000 IJ SOLN
20.0000 mL | Freq: Once | INTRAMUSCULAR | Status: AC
  Administered 2022-10-28: 20 mL

## 2022-10-28 MED ORDER — LIDOCAINE HCL 1 % IJ SOLN
5.0000 mL | Freq: Once | INTRAMUSCULAR | Status: AC
  Administered 2022-10-28: 5 mL

## 2022-11-02 LAB — SURGICAL PATHOLOGY

## 2022-11-08 ENCOUNTER — Other Ambulatory Visit: Payer: Self-pay | Admitting: Family Medicine

## 2022-12-22 IMAGING — US US ABDOMEN LIMITED
1 series · 14 of 25 positions shown · non-contrast
Comparison: None Available.

CLINICAL DATA: Elevated alkaline phosphatase

EXAM:
ULTRASOUND ABDOMEN LIMITED RIGHT UPPER QUADRANT

[Series 1: us abdomen limited ruq (liver/gb) · 14 of 69 slices shown]
[im 1/69]
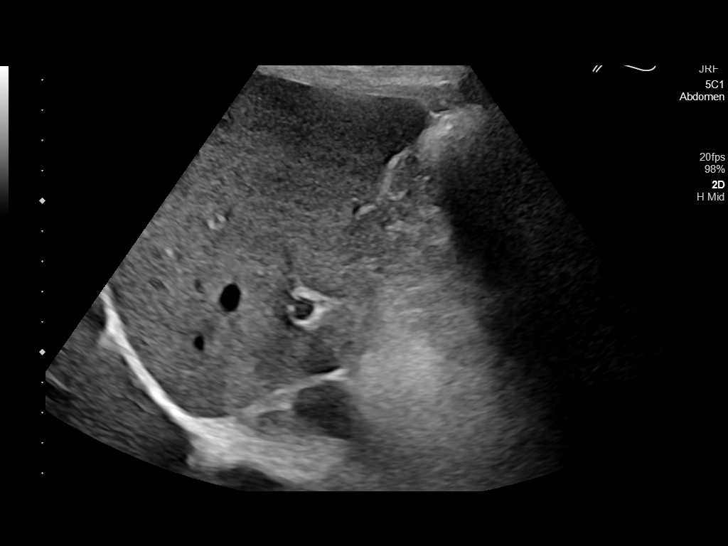
[im 6/69]
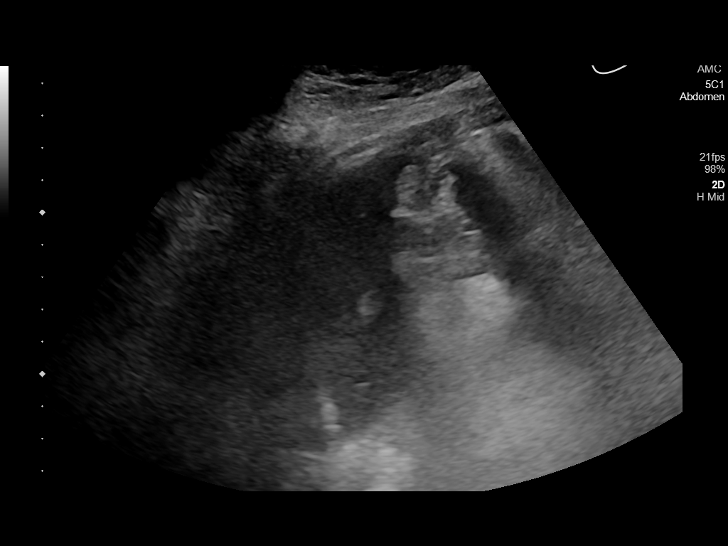
[im 12/69]
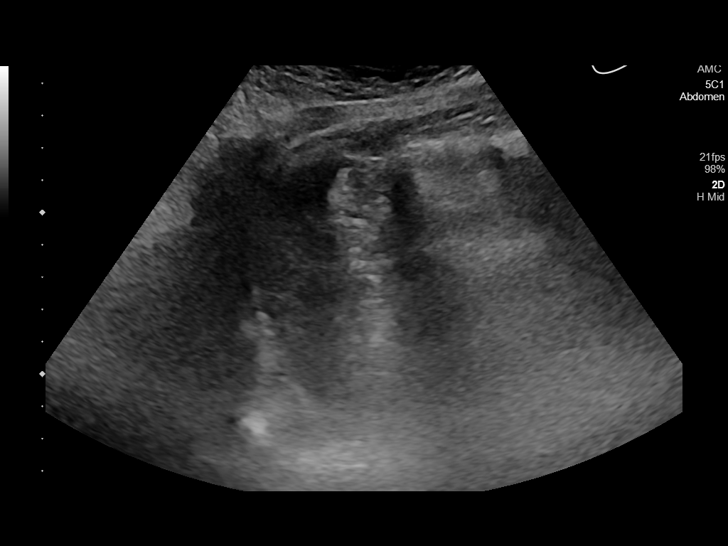
[im 18/69]
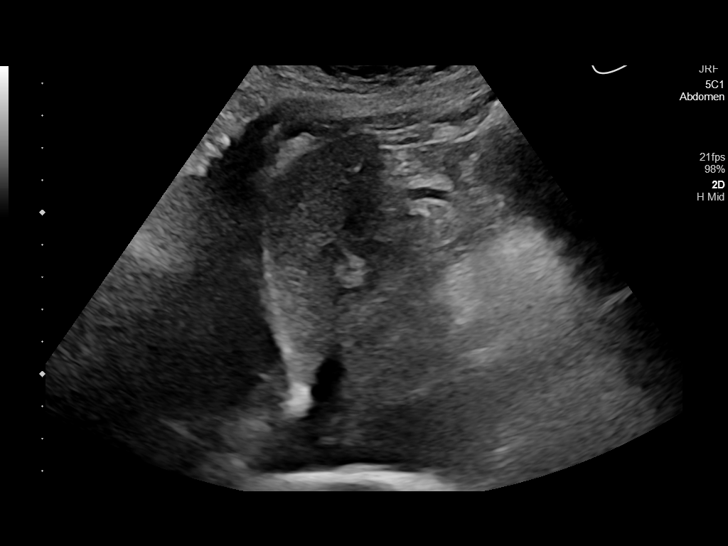
[im 23/69]
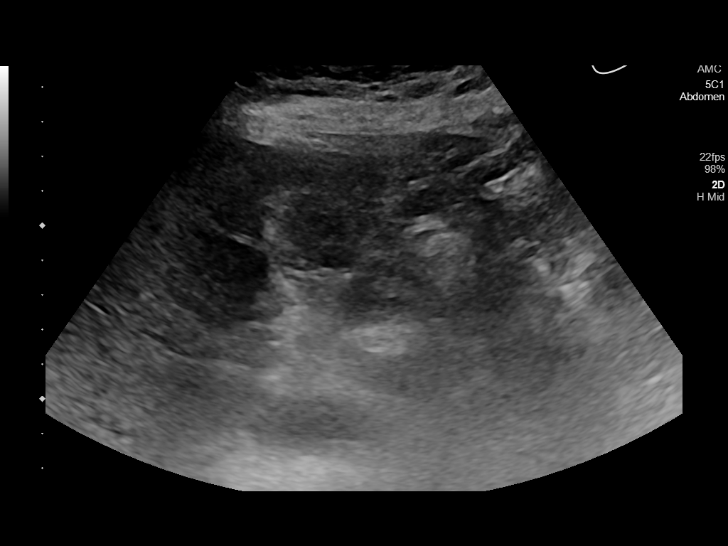
[im 26/69]
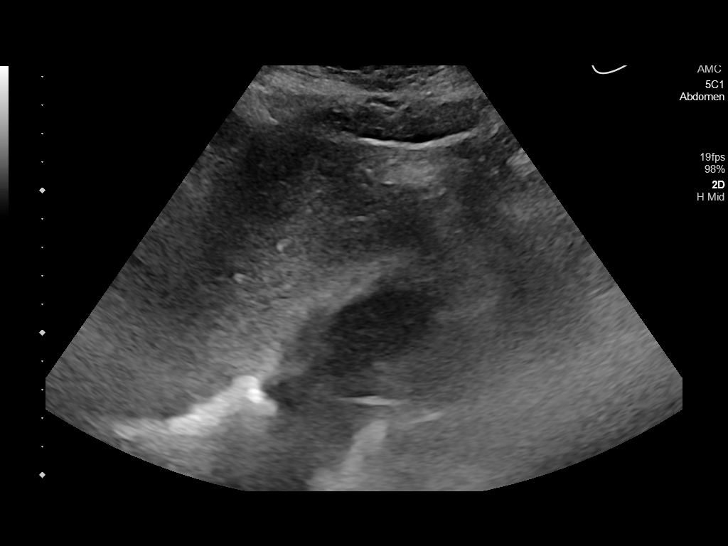
[im 32/69]
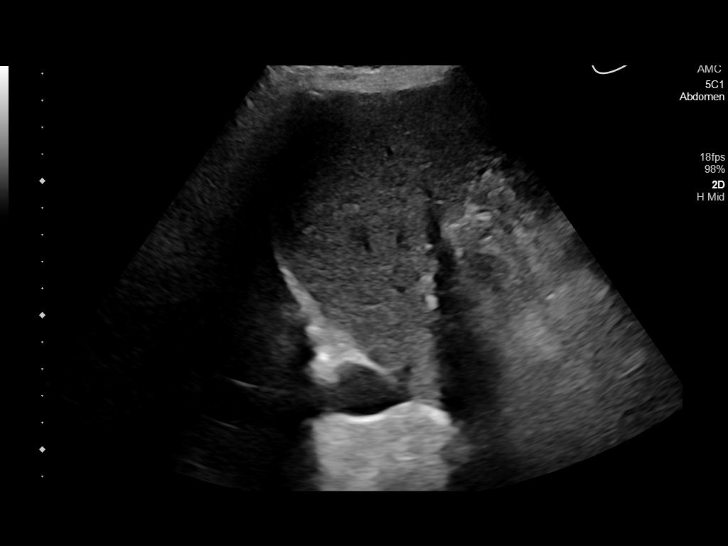
[im 37/69]
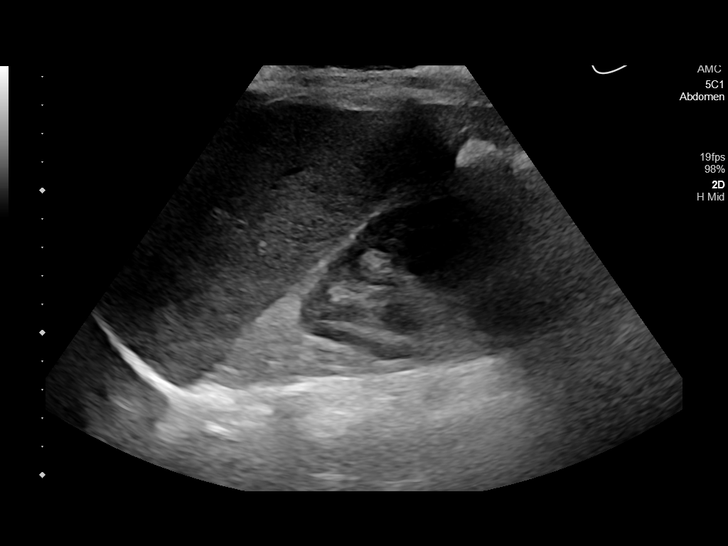
[im 43/69]
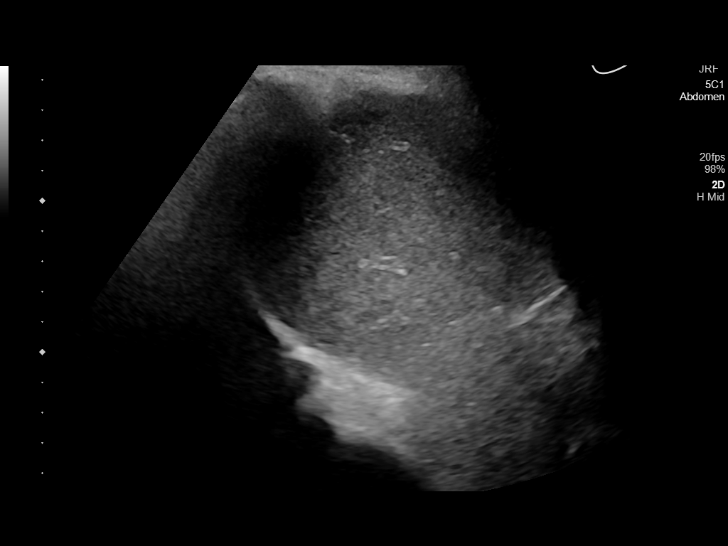
[im 46/69]
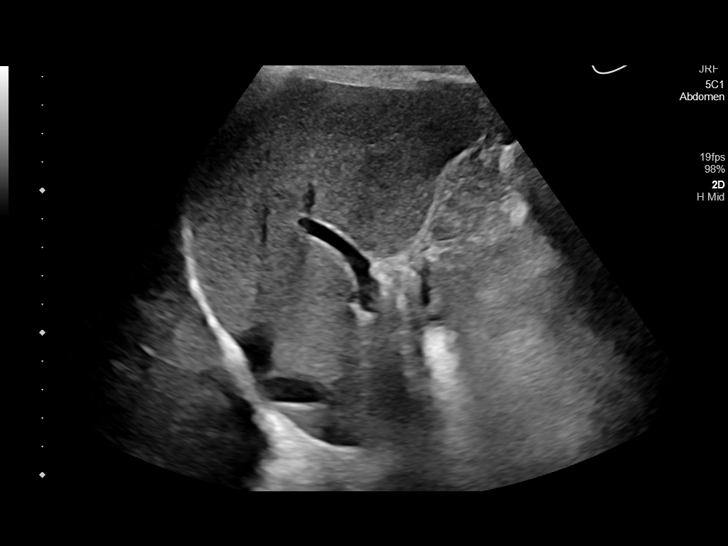
[im 52/69]
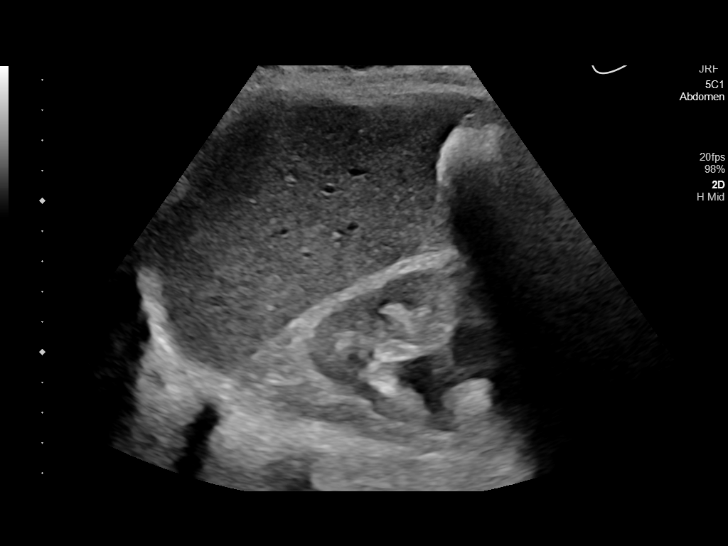
[im 57/69]
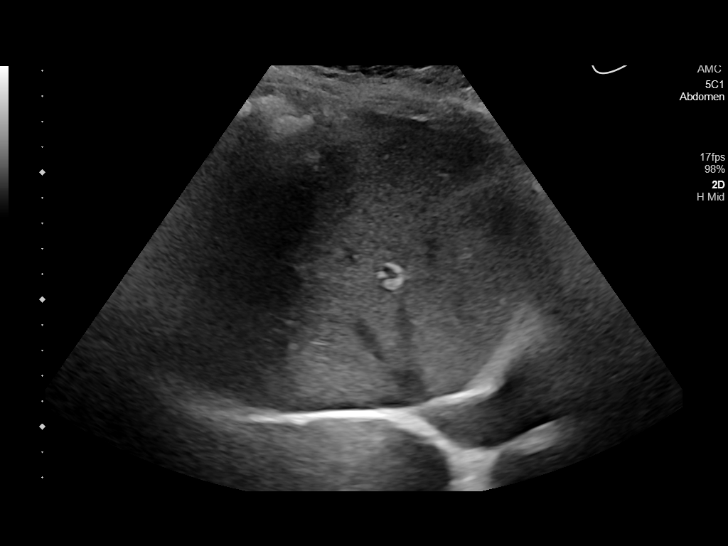
[im 63/69]
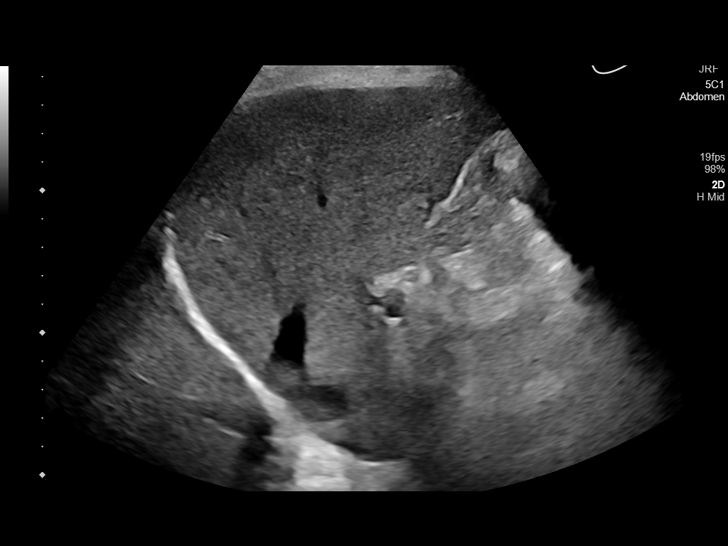
[im 69/69]
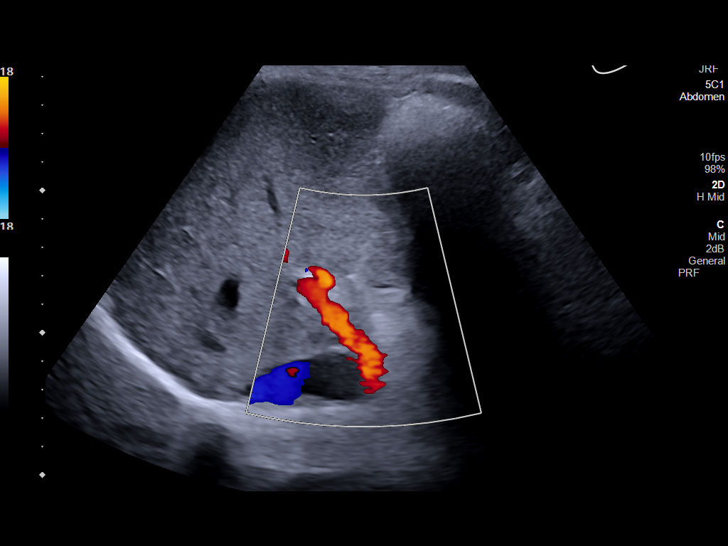

[14 of 25 positions shown; findings below may reference images not displayed]

FINDINGS: Gallbladder:

Surgically absent.

Common bile duct:

Diameter: 2 mm

Liver:

No focal lesion identified. Within normal limits in parenchymal
echogenicity. Portal vein is patent on color Doppler imaging with
normal direction of blood flow towards the liver.

Other: None.
IMPRESSION: Unremarkable right upper quadrant sonogram.

## 2023-01-05 ENCOUNTER — Ambulatory Visit: Payer: Self-pay | Admitting: *Deleted

## 2023-01-05 NOTE — Telephone Encounter (Signed)
Summary: swollen, painful feet and ankles   Patient called in stated she was stung by a hive of fire ants and now her feet and ankles are painfully swollen and itch/burn. Patient is requesting provider call a cream or something in for her. Please f/u with patient           Chief Complaint: fire ants bites bilateral feet and ankles Symptoms: stepped on fire ant mound 2 days ago and multiple bites to bilateral feet and ankles. Red dots, swelling feet and ankles. Itching pain . Scabs noted from scratching.  Frequency: 2 days ago  Pertinent Negatives: Patient denies fever, no abdominal pain no face or tongue swelling no rash .  Disposition: [] ED /[] Urgent Care (no appt availability in office) / [x] Appointment(In office/virtual)/ []  [facility]/ [] Home Care/ [] Refused Recommended Disposition /[] [facility]/ []  Follow-up with PCP Additional Notes:   Appt scheduled with provider tomorrow, not PCP no available appt. Please advise OTC medications/ ointment due to patient hx of epilepsy and can not take benadryl, Claritin or certain ointments. Recommended cool compress due to itching relief. Please advise if cool or warm compress recommended. Patient would like a call back.       Reason for Disposition  [1] Red or very tender (to touch) area AND [2] getting larger over 48 hours after the sting  Answer Assessment - Initial Assessment Questions 1. SEVERITY: "How many stings are there?"     Multiple stings  2. ONSET: "When did it occur?"      2 days  3. LOCATION: "Where is the sting located?"  "How many stings?"     Both feet and ankles  4. SWELLING: "How big is the swelling?" (e.g., inches or cm)     Swelling  5. REDNESS: "Is the area red or pink?" If Yes, ask: "What size is the area of redness?" (e.g., inches or cm). "When did the redness start?"     Redness dots started immediately after stepping on mound  6. PAIN: "Is there any pain?" If Yes, ask: "How bad is it?"   (Scale 1-10; or mild, moderate, severe)     Moderate  7. ITCHING: "Is there any itching?" If Yes, ask: "How bad is it?"      Itching  8. RESPIRATORY DISTRESS: "Describe your breathing."     No  9. PRIOR REACTIONS: "Have you had any severe allergic reactions to stings in the past?" If Yes, ask: "What happened?"     no 10. OTHER SYMPTOMS: "Do you have any other symptoms?" (e.g., abdomen pain, face or tongue swelling, new rash elsewhere, vomiting)       No sx other than feet and ankles  11. PREGNANCY: "Is there any chance you are pregnant?" "When was your last menstrual period?"       na  Protocols used: [employer]

## 2023-01-06 ENCOUNTER — Ambulatory Visit (INDEPENDENT_AMBULATORY_CARE_PROVIDER_SITE_OTHER): Payer: Medicare Other | Admitting: Physician Assistant

## 2023-01-06 ENCOUNTER — Encounter: Payer: Self-pay | Admitting: Physician Assistant

## 2023-01-06 VITALS — BP 110/81 | HR 93 | Temp 97.5°F | Ht 63.0 in | Wt 200.4 lb

## 2023-01-06 DIAGNOSIS — T63421A Toxic effect of venom of ants, accidental (unintentional), initial encounter: Secondary | ICD-10-CM

## 2023-01-06 DIAGNOSIS — G40309 Generalized idiopathic epilepsy and epileptic syndromes, not intractable, without status epilepticus: Secondary | ICD-10-CM | POA: Diagnosis not present

## 2023-01-06 NOTE — Progress Notes (Signed)
Established patient visit  Patient: Mandy Deleon   DOB: 19-Apr-1966   57 y.o. Female  MRN: [id number] Visit Date: 01/06/2023  Today's healthcare provider: Debera Lat, PA-C   Chief Complaint  Patient presents with   Medical Management of Chronic Issues    Swelling in bilateral Ankle and feet due to ant bites, prior treatment hydrocortisone cream, problem came about Wednesday night   Subjective    HPI HPI     Medical Management of Chronic Issues    Additional comments: Swelling in bilateral Ankle and feet due to ant bites, prior treatment hydrocortisone cream, problem came about Wednesday night      Last edited by Rolly Salter, CMA on 01/06/2023  1:09 PM.      *** Discussed the use of AI scribe software for clinical note transcription with the patient, who gave verbal consent to proceed.  History of Present Illness               09/23/2022   10:43 AM 07/11/2022    4:26 PM 03/17/2022    2:14 PM  Depression screen PHQ 2/9  Decreased Interest 0 0 0  Down, Depressed, Hopeless 0 0 0  PHQ - 2 Score 0 0 0  Altered sleeping 0 0 0  Tired, decreased energy 0 0 0  Change in appetite 0 0 0  Feeling bad or failure about yourself  0 0 0  Trouble concentrating 0 0 0  Moving slowly or fidgety/restless 0 1 0  Suicidal thoughts 0 0 0  PHQ-9 Score 0 1 0  Difficult doing work/chores Not difficult at all Not difficult at all Not difficult at all       No data to display          Medications: Outpatient Medications Prior to Visit  Medication Sig   acetaminophen (TYLENOL) 500 MG tablet Take 1,000 mg by mouth every 6 (six) hours as needed (pain.).    Cenobamate (XCOPRI) 100 MG TABS Take by mouth at bedtime. 100mg  every morning, 200mg  qhs, except 300mg  qhs on Wed   cetirizine (ZYRTEC) 10 MG tablet TAKE ONE TABLET BY MOUTH ONCE DAILY   cholecalciferol (VITAMIN D3) 25 MCG (1000 UNIT) tablet Take 1,000 Units by mouth daily.   Fish Oil-Cholecalciferol (OMEGA-3 + D PO) Take 1  capsule by mouth daily in the afternoon.   fluticasone (FLONASE) 50 MCG/ACT nasal spray Place 2 sprays into both nostrils daily.   levETIRAcetam (KEPPRA) 1000 MG tablet Take 1 tablet (1,000 mg total) by mouth 2 (two) times daily. (Patient taking differently: Take 750 mg by mouth 2 (two) times daily.)   Melatonin 5 MG CAPS Take 5 mg by mouth at bedtime.    Methsuximide (CELONTIN) 300 MG CAPS Take by mouth. Does not take on T/Th/Sat/Sun   ondansetron (ZOFRAN) 4 MG tablet TAKE ONE TABLET BY MOUTH EVERY EIGHT HOURS AS NEEDED FOR NAUSEA OR VOMITING   oxybutynin (DITROPAN-XL) 10 MG 24 hr tablet TAKE 1 TABLET BY MOUTH EVERY NIGHT AT BEDTIME   phenazopyridine (PYRIDIUM) 95 MG tablet Take 95 mg by mouth 3 (three) times daily as needed for pain.   Phenylephrine-DM-GG-APAP (DELSYM COUGH/COLD DAYTIME PO) Take by mouth.   zonisamide (ZONEGRAN) 100 MG capsule Take 100 mg by mouth in the morning, at noon, and at bedtime.    No facility-administered medications prior to visit.    Review of Systems Except see HPI   {Insert previous labs (optional):23779}  {See past labs  Heme  Chem  Endocrine  Serology  Results Review (optional):1}   Objective    BP 110/81 (BP Location: Left Arm, Patient Position: Sitting, Cuff Size: Large)   Pulse 93   Temp (!) 97.5 F (36.4 C) (Oral)   Ht 5\' 3"  (1.6 m)   Wt 200 lb 6.4 oz (90.9 kg)   SpO2 100%   BMI 35.50 kg/m  {Insert last BP/Wt (optional):23777}  {See vitals history (optional):1}  Physical Exam   No results found for any visits on 01/06/23.  Assessment & Plan    *** Assessment and Plan              No follow-ups on file.      [facility]

## 2023-01-13 ENCOUNTER — Other Ambulatory Visit: Payer: Self-pay | Admitting: Family Medicine

## 2023-01-16 NOTE — Telephone Encounter (Signed)
Requested Prescriptions  Pending Prescriptions Disp Refills   oxybutynin (DITROPAN-XL) 10 MG 24 hr tablet [Pharmacy Med Name: OXYBUTYNIN CHLORIDE ER 10MG  ER TABLET ER 24HR] 90 tablet 1    Sig: TAKE ONE TABLET BY MOUTH EVERY NIGHT AT BEDTIME     Urology:  Bladder Agents Passed - 01/13/2023 10:08 AM      Passed - Valid encounter within last 12 months    Recent Outpatient Visits           1 week ago Animator bite, accidental or unintentional, initial encounter   [place] [facility], [place], PA-C   3 months ago Audiological scientist for annual wellness visit (AWV) in Medicare patient   [place] [facility] [place], Marzella Schlein, MD   6 months ago Worried well   [facility] [place], Marzella Schlein, MD   10 months ago Gastroesophageal reflux disease without esophagitis   [place] [facility] [place], Marzella Schlein, MD   1 year ago Encounter for annual wellness visit (AWV) in Medicare patient   [facility] [place], Marzella Schlein, MD

## 2023-02-14 ENCOUNTER — Telehealth: Payer: Self-pay

## 2023-02-14 NOTE — Telephone Encounter (Signed)
Copied from CRM [id number]. Topic: General - Other >> Feb 14, 2023  1:27 PM Phill Myron wrote: Ms Macneill stated that she received her flu shot today at [facility]

## 2023-02-17 ENCOUNTER — Other Ambulatory Visit: Payer: Self-pay

## 2023-02-17 MED ORDER — COMIRNATY 30 MCG/0.3ML IM SUSY
0.3000 mL | PREFILLED_SYRINGE | Freq: Once | INTRAMUSCULAR | 0 refills | Status: AC
  Filled 2023-02-17: qty 0.3, 1d supply, fill #0

## 2023-02-20 ENCOUNTER — Other Ambulatory Visit: Payer: Self-pay

## 2023-02-20 ENCOUNTER — Ambulatory Visit: Payer: Medicare Other | Admitting: Family Medicine

## 2023-02-22 ENCOUNTER — Other Ambulatory Visit: Payer: Self-pay

## 2023-02-23 ENCOUNTER — Other Ambulatory Visit: Payer: Self-pay

## 2023-03-01 ENCOUNTER — Ambulatory Visit: Payer: Medicare Other

## 2023-03-07 ENCOUNTER — Observation Stay
Admission: EM | Admit: 2023-03-07 | Discharge: 2023-03-08 | Disposition: A | Discharge status: Home / Self Care | Payer: Medicare Other | Attending: Osteopathic Medicine | Admitting: Osteopathic Medicine

## 2023-03-07 ENCOUNTER — Emergency Department: Payer: Medicare Other

## 2023-03-07 ENCOUNTER — Emergency Department
Admission: EM | Admit: 2023-03-07 | Discharge: 2023-03-07 | Disposition: A | Discharge status: Home / Self Care | Payer: Medicare Other | Source: Home / Self Care | Attending: Emergency Medicine | Admitting: Emergency Medicine

## 2023-03-07 ENCOUNTER — Other Ambulatory Visit: Payer: Self-pay

## 2023-03-07 ENCOUNTER — Encounter: Payer: Self-pay | Admitting: *Deleted

## 2023-03-07 DIAGNOSIS — Z79899 Other long term (current) drug therapy: Secondary | ICD-10-CM | POA: Insufficient documentation

## 2023-03-07 DIAGNOSIS — R8281 Pyuria: Secondary | ICD-10-CM

## 2023-03-07 DIAGNOSIS — W1839XA Other fall on same level, initial encounter: Secondary | ICD-10-CM | POA: Insufficient documentation

## 2023-03-07 DIAGNOSIS — D72829 Elevated white blood cell count, unspecified: Secondary | ICD-10-CM | POA: Diagnosis not present

## 2023-03-07 DIAGNOSIS — R55 Syncope and collapse: Secondary | ICD-10-CM | POA: Insufficient documentation

## 2023-03-07 DIAGNOSIS — R296 Repeated falls: Secondary | ICD-10-CM

## 2023-03-07 DIAGNOSIS — N390 Urinary tract infection, site not specified: Secondary | ICD-10-CM | POA: Diagnosis not present

## 2023-03-07 DIAGNOSIS — S0101XA Laceration without foreign body of scalp, initial encounter: Secondary | ICD-10-CM | POA: Insufficient documentation

## 2023-03-07 DIAGNOSIS — R4020 Unspecified coma: Secondary | ICD-10-CM

## 2023-03-07 DIAGNOSIS — G40309 Generalized idiopathic epilepsy and epileptic syndromes, not intractable, without status epilepticus: Secondary | ICD-10-CM | POA: Diagnosis present

## 2023-03-07 DIAGNOSIS — I10 Essential (primary) hypertension: Secondary | ICD-10-CM | POA: Diagnosis not present

## 2023-03-07 DIAGNOSIS — G40909 Epilepsy, unspecified, not intractable, without status epilepticus: Secondary | ICD-10-CM | POA: Diagnosis not present

## 2023-03-07 DIAGNOSIS — J189 Pneumonia, unspecified organism: Secondary | ICD-10-CM | POA: Diagnosis not present

## 2023-03-07 DIAGNOSIS — W19XXXA Unspecified fall, initial encounter: Secondary | ICD-10-CM

## 2023-03-07 DIAGNOSIS — Z87891 Personal history of nicotine dependence: Secondary | ICD-10-CM | POA: Insufficient documentation

## 2023-03-07 LAB — BASIC METABOLIC PANEL
Anion gap: 13 (ref 5–15)
BUN: 21 mg/dL — ABNORMAL HIGH (ref 6–20)
CO2: 20 mmol/L — ABNORMAL LOW (ref 22–32)
Calcium: 9.1 mg/dL (ref 8.9–10.3)
Chloride: 106 mmol/L (ref 98–111)
Creatinine, Ser: 1.04 mg/dL — ABNORMAL HIGH (ref 0.44–1.00)
GFR, Estimated: >60 mL/min (ref >60)
Glucose, Bld: 143 mg/dL — ABNORMAL HIGH (ref 70–99)
Potassium: 4 mmol/L (ref 3.5–5.1)
Sodium: 139 mmol/L (ref 135–145)

## 2023-03-07 LAB — URINE DRUG SCREEN, QUALITATIVE (ARMC ONLY)
Amphetamines, Ur Screen: NOT DETECTED
Barbiturates, Ur Screen: NOT DETECTED
Benzodiazepine, Ur Scrn: NOT DETECTED
Cannabinoid 50 Ng, Ur ~~LOC~~: NOT DETECTED
Cocaine Metabolite,Ur ~~LOC~~: NOT DETECTED
MDMA (Ecstasy)Ur Screen: NOT DETECTED
Methadone Scn, Ur: NOT DETECTED
Opiate, Ur Screen: NOT DETECTED
Phencyclidine (PCP) Ur S: NOT DETECTED
Tricyclic, Ur Screen: NOT DETECTED

## 2023-03-07 LAB — URINALYSIS, ROUTINE W REFLEX MICROSCOPIC
Bilirubin Urine: NEGATIVE
Glucose, UA: NEGATIVE mg/dL
Hgb urine dipstick: NEGATIVE
Ketones, ur: NEGATIVE mg/dL
Nitrite: POSITIVE — AB
Protein, ur: NEGATIVE mg/dL
Specific Gravity, Urine: 1.02 (ref 1.005–1.030)
pH: 7 (ref 5.0–8.0)

## 2023-03-07 LAB — COMPREHENSIVE METABOLIC PANEL
ALT: 36 U/L (ref 0–44)
AST: 31 U/L (ref 15–41)
Albumin: 3.9 g/dL (ref 3.5–5.0)
Alkaline Phosphatase: 157 U/L — ABNORMAL HIGH (ref 38–126)
Anion gap: 11 (ref 5–15)
BUN: 24 mg/dL — ABNORMAL HIGH (ref 6–20)
CO2: 22 mmol/L (ref 22–32)
Calcium: 8.9 mg/dL (ref 8.9–10.3)
Chloride: 108 mmol/L (ref 98–111)
Creatinine, Ser: 1.11 mg/dL — ABNORMAL HIGH (ref 0.44–1.00)
GFR, Estimated: 58 mL/min — ABNORMAL LOW (ref >60)
Glucose, Bld: 119 mg/dL — ABNORMAL HIGH (ref 70–99)
Potassium: 4.5 mmol/L (ref 3.5–5.1)
Sodium: 141 mmol/L (ref 135–145)
Total Bilirubin: 0.3 mg/dL (ref 0.3–1.2)
Total Protein: 7.1 g/dL (ref 6.5–8.1)

## 2023-03-07 LAB — CBC
HCT: 45.5 % (ref 36.0–46.0)
Hemoglobin: 14.3 g/dL (ref 12.0–15.0)
MCH: 31.6 pg (ref 26.0–34.0)
MCHC: 31.4 g/dL (ref 30.0–36.0)
MCV: 100.4 fL — ABNORMAL HIGH (ref 80.0–100.0)
Platelets: 251 10*3/uL (ref 150–400)
RBC: 4.53 MIL/uL (ref 3.87–5.11)
RDW: 13.2 % (ref 11.5–15.5)
WBC: 11.4 10*3/uL — ABNORMAL HIGH (ref 4.0–10.5)
nRBC: 0 % (ref 0.0–0.2)

## 2023-03-07 LAB — ETHANOL: Alcohol, Ethyl (B): <10 mg/dL (ref <10)

## 2023-03-07 LAB — CBC WITH DIFFERENTIAL/PLATELET
Abs Immature Granulocytes: 0.03 10*3/uL (ref 0.00–0.07)
Basophils Absolute: 0 10*3/uL (ref 0.0–0.1)
Basophils Relative: 0 %
Eosinophils Absolute: 0.1 10*3/uL (ref 0.0–0.5)
Eosinophils Relative: 2 %
HCT: 44.4 % (ref 36.0–46.0)
Hemoglobin: 14 g/dL (ref 12.0–15.0)
Immature Granulocytes: 0 %
Lymphocytes Relative: 15 %
Lymphs Abs: 1 10*3/uL (ref 0.7–4.0)
MCH: 31.7 pg (ref 26.0–34.0)
MCHC: 31.5 g/dL (ref 30.0–36.0)
MCV: 100.7 fL — ABNORMAL HIGH (ref 80.0–100.0)
Monocytes Absolute: 0.5 10*3/uL (ref 0.1–1.0)
Monocytes Relative: 7 %
Neutro Abs: 5.1 10*3/uL (ref 1.7–7.7)
Neutrophils Relative %: 76 %
Platelets: 228 10*3/uL (ref 150–400)
RBC: 4.41 MIL/uL (ref 3.87–5.11)
RDW: 13.1 % (ref 11.5–15.5)
WBC: 6.8 10*3/uL (ref 4.0–10.5)
nRBC: 0 % (ref 0.0–0.2)

## 2023-03-07 LAB — TROPONIN I (HIGH SENSITIVITY)
Troponin I (High Sensitivity): 4 ng/L (ref <18)
Troponin I (High Sensitivity): <2 ng/L (ref <18)

## 2023-03-07 LAB — LACTIC ACID, PLASMA: Lactic Acid, Venous: 1.4 mmol/L (ref 0.5–1.9)

## 2023-03-07 LAB — CK: Total CK: 59 U/L (ref 38–234)

## 2023-03-07 LAB — PROCALCITONIN: Procalcitonin: <0.1 ng/mL

## 2023-03-07 LAB — MAGNESIUM: Magnesium: 2.3 mg/dL (ref 1.7–2.4)

## 2023-03-07 MED ORDER — LIDOCAINE-EPINEPHRINE (PF) 2 %-1:200000 IJ SOLN
INTRAMUSCULAR | Status: AC
  Filled 2023-03-07: qty 20

## 2023-03-07 MED ORDER — OMEGA-3-ACID ETHYL ESTERS 1 G PO CAPS: 1.0000 | ORAL_CAPSULE | Freq: Every day | ORAL | Status: DC

## 2023-03-07 MED ORDER — METHSUXIMIDE 300 MG PO CAPS: 300.0000 mg | ORAL_CAPSULE | ORAL | Status: DC

## 2023-03-07 MED ORDER — ONDANSETRON HCL 4 MG/2ML IJ SOLN
4.0000 mg | Freq: Four times a day (QID) | INTRAMUSCULAR | Status: DC | PRN
  Administered 2023-03-07 – 2023-03-08 (×2): 4 mg via INTRAVENOUS
  Filled 2023-03-07 (×2): qty 2

## 2023-03-07 MED ORDER — SODIUM CHLORIDE 0.9 % IV SOLN
2.0000 g | INTRAVENOUS | Status: DC
  Administered 2023-03-07: 2 g via INTRAVENOUS
  Filled 2023-03-07 (×2): qty 20

## 2023-03-07 MED ORDER — LORAZEPAM 2 MG/ML IJ SOLN: 1.0000 mg | INTRAMUSCULAR | Status: DC | PRN

## 2023-03-07 MED ORDER — ZONISAMIDE 100 MG PO CAPS
100.0000 mg | ORAL_CAPSULE | Freq: Three times a day (TID) | ORAL | Status: DC
  Administered 2023-03-08: 100 mg via ORAL
  Filled 2023-03-07 (×2): qty 1

## 2023-03-07 MED ORDER — ZONISAMIDE 100 MG PO CAPS
100.0000 mg | ORAL_CAPSULE | ORAL | Status: AC
  Administered 2023-03-07: 100 mg via ORAL
  Filled 2023-03-07: qty 1

## 2023-03-07 MED ORDER — ZONISAMIDE 100 MG PO CAPS
100.0000 mg | ORAL_CAPSULE | Freq: Once | ORAL | Status: AC
  Administered 2023-03-07: 100 mg via ORAL
  Filled 2023-03-07: qty 1

## 2023-03-07 MED ORDER — SENNOSIDES-DOCUSATE SODIUM 8.6-50 MG PO TABS: 1.0000 | ORAL_TABLET | Freq: Every evening | ORAL | Status: DC | PRN

## 2023-03-07 MED ORDER — SODIUM CHLORIDE 0.9 % IV SOLN
500.0000 mg | INTRAVENOUS | Status: DC
  Administered 2023-03-07: 500 mg via INTRAVENOUS
  Filled 2023-03-07 (×2): qty 5

## 2023-03-07 MED ORDER — SODIUM CHLORIDE 0.9 % IV SOLN
1.0000 g | INTRAVENOUS | Status: DC
  Filled 2023-03-07: qty 10

## 2023-03-07 MED ORDER — FLUTICASONE PROPIONATE 50 MCG/ACT NA SUSP: 2.0000 | Freq: Every day | NASAL | Status: DC | PRN

## 2023-03-07 MED ORDER — LACTATED RINGERS IV BOLUS (SEPSIS)
1000.0000 mL | Freq: Once | INTRAVENOUS | Status: AC
  Administered 2023-03-07: 1000 mL via INTRAVENOUS

## 2023-03-07 MED ORDER — CEPHALEXIN 500 MG PO CAPS: 500.0000 mg | ORAL_CAPSULE | Freq: Two times a day (BID) | ORAL | 0 refills | Status: AC

## 2023-03-07 MED ORDER — LEVETIRACETAM 750 MG PO TABS
750.0000 mg | ORAL_TABLET | Freq: Two times a day (BID) | ORAL | Status: DC
  Administered 2023-03-08: 750 mg via ORAL
  Filled 2023-03-07: qty 1

## 2023-03-07 MED ORDER — ACETAMINOPHEN 500 MG PO TABS
1000.0000 mg | ORAL_TABLET | Freq: Once | ORAL | Status: AC
  Administered 2023-03-07: 1000 mg via ORAL
  Filled 2023-03-07: qty 2

## 2023-03-07 MED ORDER — MELATONIN 5 MG PO TABS: 5.0000 mg | ORAL_TABLET | Freq: Every day | ORAL | Status: DC

## 2023-03-07 MED ORDER — LEVETIRACETAM 750 MG PO TABS
750.0000 mg | ORAL_TABLET | Freq: Once | ORAL | Status: AC
  Administered 2023-03-07: 750 mg via ORAL
  Filled 2023-03-07: qty 1

## 2023-03-07 MED ORDER — ACETAMINOPHEN 500 MG PO TABS: 1000.0000 mg | ORAL_TABLET | Freq: Four times a day (QID) | ORAL | Status: DC | PRN

## 2023-03-07 MED ORDER — LACTATED RINGERS IV SOLN
150.0000 mL/h | INTRAVENOUS | Status: DC
  Administered 2023-03-08 (×2): 150 mL/h via INTRAVENOUS

## 2023-03-07 MED ORDER — ACETAMINOPHEN 650 MG RE SUPP: 650.0000 mg | Freq: Four times a day (QID) | RECTAL | Status: DC | PRN

## 2023-03-07 MED ORDER — SODIUM CHLORIDE 0.9 % IV SOLN
1.0000 g | Freq: Once | INTRAVENOUS | Status: AC
  Administered 2023-03-07: 1 g via INTRAVENOUS
  Filled 2023-03-07: qty 10

## 2023-03-07 MED ORDER — OXYBUTYNIN CHLORIDE ER 10 MG PO TB24: 10.0000 mg | ORAL_TABLET | Freq: Every day | ORAL | Status: DC

## 2023-03-07 MED ORDER — ACETAMINOPHEN 325 MG PO TABS
650.0000 mg | ORAL_TABLET | Freq: Four times a day (QID) | ORAL | Status: DC | PRN
  Administered 2023-03-08: 650 mg via ORAL
  Filled 2023-03-07: qty 2

## 2023-03-07 MED ORDER — ENOXAPARIN SODIUM 60 MG/0.6ML IJ SOSY: 0.5000 mg/kg | PREFILLED_SYRINGE | INTRAMUSCULAR | Status: DC

## 2023-03-07 MED ORDER — SODIUM CHLORIDE 0.9 % IV BOLUS
1000.0000 mL | Freq: Once | INTRAVENOUS | Status: AC
  Administered 2023-03-07: 1000 mL via INTRAVENOUS

## 2023-03-07 MED ORDER — TETANUS-DIPHTH-ACELL PERTUSSIS 5-2.5-18.5 LF-MCG/0.5 IM SUSY
0.5000 mL | PREFILLED_SYRINGE | Freq: Once | INTRAMUSCULAR | Status: DC
  Filled 2023-03-07: qty 0.5

## 2023-03-07 MED ORDER — ONDANSETRON HCL 4 MG PO TABS: 4.0000 mg | ORAL_TABLET | Freq: Four times a day (QID) | ORAL | Status: DC | PRN

## 2023-03-07 MED ORDER — HYDRALAZINE HCL 20 MG/ML IJ SOLN: 5.0000 mg | Freq: Three times a day (TID) | INTRAMUSCULAR | Status: DC | PRN

## 2023-03-07 NOTE — Hospital Course (Signed)
Mr. Mandy Deleon is a 42 female with history of simple partial, complex partial generalized tonic-clonic seizure disorder, who presents emergency department for chief concerns of syncope in the shower.  Vitals in the ED showed temperature of 98.3, respiration rate 20, heart rate of 114, blood pressure 140/99, SpO2 95% on room air.  Serum sodium is 139, potassium 4.0, chloride 106, bicarb 20, BUN of 21, serum creatinine 1.04, EGFR greater than 60, nonfasting blood glucose 143, WBC 11.4, hemoglobin 14.3, platelets of 251.  High sensitive troponin is less than 2.  UA was positive for nitrates and positive for trace leukocytes.  CT head wo contrast: Was read as no significant changes since prior study earlier today.  No acute intracranial abnormality.  CT cervical spine: Bilateral patchy groundglass changes and smooth interlobular septal thickening of bilateral lung apices.  ED treatment: Keppra 750 mg p.o. one-time dose, Zonegran 100 mg, ceftriaxone 1 g IV, sodium chloride 1 L bolus.

## 2023-03-07 NOTE — Assessment & Plan Note (Addendum)
Possible atypical pneumonia on bilateral apices. Check procalcitonin on admission, check lactic acid x 2 Continue with azithromycin 500 mg IV daily, ceftriaxone 2 g IV daily Incentive spirometry, flutter valve Admit to telemetry medical, inpatient

## 2023-03-07 NOTE — Discharge Instructions (Signed)
This could have been a seizure episode a syncopal episode or a fall that because you hit your head you do not really remember with a small concussion.  We have offered you admission due to some continued mild weakness but you have declined stated that you would prefer to go home.  There is concern for possible UTI so we are starting you on antibiotic.  You need to have the staples removed in 10-14 days and you can return to the ER if develop worsening symptoms, fevers or any other concerns

## 2023-03-07 NOTE — ED Triage Notes (Signed)
Arrives from home.  Boyfriend witnessed seizure while patient in shower. Fell and hit back of head.  Seen through ED earlier this morning for same and had laceration stapled.  VS wnl  Per EMS report, patient a little unsteady on feet.  CBG:  142  Takes Keppra

## 2023-03-07 NOTE — ED Notes (Addendum)
Pt sitting in w/c in lobby with no distress noted, accomp by female visitor; Pt removed c-collar despite instructions to keep in place until eval by the provider

## 2023-03-07 NOTE — ED Triage Notes (Addendum)
Pt brought in via ems from home.  Pt had 2 seizures today.  Pt wearing a c- collar.  Pt fell and hit back of head.  Pt was seen here this am for similar sx and had staple repair to head.  Pt has a headache.  No blood thinners.  Pt alert.    Pt has n/v in triage now.

## 2023-03-07 NOTE — Assessment & Plan Note (Signed)
Suspect secondary to community-acquired pneumonia versus UTI, present on admission  Treat per above check CBC in a.m.

## 2023-03-07 NOTE — H&P (Addendum)
History and Physical   Mandy Deleon NFA:[id number] DOB: 05/28/1966 DOA: 03/07/2023  PCP: Erasmo Downer, MD Outpatient Specialists: Dr. Craig Staggers, [facility] neurology Patient coming from: Home  I have personally briefly reviewed patient's old medical records in [facility] EMR.  Chief Concern: Unwitnessed fall  HPI: Mr. Mandy Deleon is a 57 female with history of simple partial, complex partial generalized tonic-clonic seizure disorder, who presents emergency department for chief concerns of syncope in the shower.  Vitals in the ED showed temperature of 98.3, respiration rate 20, heart rate of 114, blood pressure 140/99, SpO2 95% on room air.  Serum sodium is 139, potassium 4.0, chloride 106, bicarb 20, BUN of 21, serum creatinine 1.04, EGFR greater than 60, nonfasting blood glucose 143, WBC 11.4, hemoglobin 14.3, platelets of 251.  High sensitive troponin is less than 2.  UA was positive for nitrates and positive for trace leukocytes.  CT head wo contrast: Was read as no significant changes since prior study earlier today.  No acute intracranial abnormality.  CT cervical spine: Bilateral patchy groundglass changes and smooth interlobular septal thickening of bilateral lung apices.  ED treatment: Keppra 750 mg p.o. one-time dose, Zonegran 100 mg, ceftriaxone 1 g IV, sodium chloride 1 L bolus. -------------------------------- At bedside patient was able to tell me her name, age, location, current calendar year.  She reports that she was walking her dog earlier today when she was found on the floor by her neighbor.  She reports she does not know what happened.  She came to the emergency department however elected to be discharged home as she did not want to stay in the hospital.  When she went home, she took a shower and did not feel well, she alerted her boyfriend almost.  Wife reports that she had a glazed over look.  At bedside, she denies being in pain, denies neck pain.  She endorses  a headache and is asking for Tylenol.  She denies dysuria, hematuria, changes to her urinary habits.  She denies diarrhea, cough, wheezing.  Social history: Patient lives at home with her boyfriend.  She denies tobacco, EtOH, recreational drug use.  ROS: Constitutional: no weight change, no fever ENT/Mouth: no sore throat, no rhinorrhea Eyes: no eye pain, no vision changes Cardiovascular: no chest pain, no dyspnea,  no edema, no palpitations Respiratory: no cough, no sputum, no wheezing Gastrointestinal: no nausea, no vomiting, no diarrhea, no constipation Genitourinary: no urinary incontinence, no dysuria, no hematuria Musculoskeletal: no arthralgias, no myalgias Skin: no skin lesions, no pruritus, Neuro: + weakness, no loss of consciousness, + syncope Psych: no anxiety, no depression, no decrease appetite Heme/Lymph: no bruising, no bleeding  ED Course: Discussed with emergency medicine provider, patient requiring hospitalization for chief concerns of possible seizure.  Assessment/Plan  Principal Problem:   Community acquired pneumonia Active Problems:   Nonintractable generalized idiopathic epilepsy without status epilepticus (HCC)   Essential hypertension   Frequent falls   Leukocytosis   Pyuria   Assessment and Plan:  * Community acquired pneumonia Possible atypical pneumonia on bilateral apices. Check procalcitonin on admission, check lactic acid x 2 Continue with azithromycin 500 mg IV daily, ceftriaxone 2 g IV daily Incentive spirometry, flutter valve Admit to telemetry medical, inpatient  Pyuria Present on admission Ceftriaxone 2 g IV daily, 5 days ordered  Leukocytosis Suspect secondary to community-acquired pneumonia versus UTI, present on admission  Treat per above check CBC in a.m.  Frequent falls Precaution, aspiration precaution  Essential hypertension Hydralazine 5  mg IV every 8 hours as needed for SBP greater 170, 4 days  ordered  Nonintractable generalized idiopathic epilepsy without status epilepticus (HCC) Possible decrease in seizure threshold in setting of UTI or community-acquired pneumonia, if patient continues to have unwitnessed syncope or seizure episodes in the hospital, would recommend a.m. team to consult neurology Patient reports compliance with her seizure medication Home Keppra 750 mg p.o. twice daily, mesuximide 200 mg per home dosing instructions, zonisamide 100 mg p.o. per home dosing resumed on admission Seizure precaution, fall precaution, aspiration precaution  Chart reviewed.   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   Frequent falls    d/t seizures   Gait instability    specifically w/ seizure   History of viral encephalitis 1980   causing current seizure disorder   Seizures (HCC)    Ulcerative colitis (HCC)    Past Surgical History:  Procedure Laterality Date   APPENDECTOMY     BREAST BIOPSY Right 10/28/2022   stereo bx, calcs, "RIBBON" clip-path pending   BREAST BIOPSY Right 10/28/2022   MM RT BREAST BX W LOC DEV 1ST LESION IMAGE BX SPEC STEREO GUIDE 10/28/2022 [facility]   CATARACT EXTRACTION W/PHACO Left 09/29/2020   Procedure: CATARACT EXTRACTION PHACO AND INTRAOCULAR LENS PLACEMENT (IOC) LEFT 11.62 01:05.0;  Surgeon: Galen Manila, MD;  Location: [facility];  Service: Ophthalmology;  Laterality: Left;   CATARACT EXTRACTION  W/PHACO Right 10/13/2020   Procedure: CATARACT EXTRACTION PHACO AND INTRAOCULAR LENS PLACEMENT (IOC) RIGHT;  Surgeon: Galen Manila, MD;  Location: [facility];  Service: Ophthalmology;  Laterality: Right;  CDE 4.17 00:23.0 minutes   CHOLECYSTECTOMY     CRANIECTOMY / CRANIOTOMY FOR IMPLANTATION NEUROSTIMULATOR ELECTRODES  2011   EYE SURGERY  10/2019   HYSTEROSCOPY WITH D & C Bilateral 11/08/2019   Procedure: DILATATION AND CURETTAGE /HYSTEROSCOPY;  Surgeon: Linzie Collin, MD;  Location: [facility];  Service: Gynecology;  Laterality: Bilateral;   PARS PLANA VITRECTOMY Left 2021   RNS     responsive neurostimulation for seizures. (brain electrodes)   TONSILLECTOMY     vagas nerve stimulator  2011   Social History:  reports that she quit smoking about 35 years ago. Her smoking use included cigarettes. She has never used smokeless tobacco. She reports that she does not drink alcohol and does not use drugs.  Allergies  Allergen Reactions   Avocado Anaphylaxis   Aspirin Other (See Comments)    Aspirin and all aspirin products are not compatible with her seizure medications.   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  That is how she feels when having a seizure. It can cause me to go into a seizure  Phenobarbital Other (See Comments)    Designed for genetic epilepsy, not encephalitic   Phenytoin Sodium Extended Other (See Comments)    Designed for genetic epilepsy, not encephalitic   Prednisone Other (See Comments)    Caused her to go into cluster seizures   Zoloft [Sertraline] Other (See Comments)    Made seizures worse   Other    Adhesive [Tape] Other (See Comments)    Welt to skin. Rash PAPER  TAPE IS OKAY   Ativan [Lorazepam] Nausea And Vomiting   Carbamazepine Other (See Comments)    Designed for genetic epilepsy, not encephalitic   Codeine Other (See Comments)    "makes me loopy"   Iodine Rash    Iodine on the skin causes hives   Montelukast Other (See Comments)    Patient was told that it was best that she NOT take this medication   Topiramate Other (See Comments)    Did not work for patient   Family History  Problem Relation Age of Onset   Breast cancer Paternal Aunt    Ovarian cancer Mother    Diabetes Mother    Diabetes Brother    Diabetes Maternal Aunt    Diabetes Maternal Uncle    Diabetes Maternal Grandmother    Colon cancer Neg Hx    Family history: Family history reviewed and not pertinent.  Prior to Admission medications   Medication Sig Start Date End Date Taking? Authorizing Provider  acetaminophen (TYLENOL) 500 MG tablet Take 1,000 mg by mouth every 6 (six) hours as needed (pain.).     [provider]  Cenobamate (XCOPRI) 100 MG TABS Take by mouth at bedtime. 100mg  every morning, 200mg  qhs, except 300mg  qhs on Wed    [provider]  cephALEXin (KEFLEX) 500 MG capsule Take 1 capsule (500 mg total) by mouth 2 (two) times daily for 7 days. 03/07/23 03/14/23  Concha Se, MD  cetirizine (ZYRTEC) 10 MG tablet TAKE ONE TABLET BY MOUTH ONCE DAILY 08/16/22   Erasmo Downer, MD  cholecalciferol (VITAMIN D3) 25 MCG (1000 UNIT) tablet Take 1,000 Units by mouth daily.    [provider]  Fish Oil-Cholecalciferol (OMEGA-3 + D PO) Take 1 capsule by mouth daily in the afternoon.    [provider]  fluticasone (FLONASE) 50 MCG/ACT nasal spray Place 2 sprays into both nostrils daily. 09/23/22   Erasmo Downer, MD  levETIRAcetam (KEPPRA) 1000 MG tablet Take 1 tablet (1,000 mg total) by mouth 2 (two) times daily. Patient taking differently: Take 750 mg by mouth 2 (two) times daily. 11/24/20   Merwyn Katos, MD  Melatonin 5  MG CAPS Take 5 mg by mouth at bedtime.     [provider]  Methsuximide (CELONTIN) 300 MG CAPS Take by mouth. Does not take on T/Th/Sat/Sun    [provider]  ondansetron (ZOFRAN) 4 MG tablet TAKE ONE TABLET BY MOUTH EVERY EIGHT HOURS AS NEEDED FOR NAUSEA OR VOMITING 11/08/22   Bacigalupo, Marzella Schlein, MD  oxybutynin (DITROPAN-XL) 10 MG 24 hr tablet TAKE ONE TABLET BY MOUTH EVERY NIGHT AT BEDTIME 01/16/23   Erasmo Downer, MD  phenazopyridine (PYRIDIUM) 95 MG tablet Take 95 mg by mouth 3 (three) times daily as needed for pain.    [provider]  Phenylephrine-DM-GG-APAP (DELSYM COUGH/COLD DAYTIME PO) Take by mouth.    [provider]  zonisamide (ZONEGRAN) 100 MG capsule Take 100 mg by mouth in the morning, at noon, and at bedtime.  05/13/19 09/23/22  [provider]   Physical Exam: Vitals:   03/07/23 1834 03/07/23 1840 03/07/23 2153  BP:  (!) 140/99 125/66  Pulse:  (!) 114 100  Resp:  20 20  Temp:  98.3 F (36.8 C)   TempSrc:  Oral   SpO2:  95% 100%  Weight: 86.2 kg    Height: 5\' 3"  (1.6 m)     Constitutional: appears older than chronological age, NAD, calm Eyes: PERRL, lids and conjunctivae normal ENMT: Mucous membranes are moist. Posterior pharynx clear of any exudate or lesions. Age-appropriate dentition. Hearing appropriate Neck: normal, supple, no masses, no thyromegaly Respiratory: clear to auscultation bilaterally, no wheezing, no crackles. Normal respiratory effort. No accessory muscle use.  Cardiovascular: Regular rate and rhythm, no murmurs / rubs / gallops. No extremity edema. 2+ pedal pulses. No carotid bruits.  Abdomen: Abdomen, no tenderness, no masses palpated, no hepatosplenomegaly. Bowel sounds positive.  Musculoskeletal: no clubbing / cyanosis. No joint deformity upper and lower extremities. Good ROM, no contractures, no atrophy. Normal muscle tone.  Skin: no rashes, lesions, ulcers. No induration Neurologic: Sensation  intact. Strength 5/5 in all 4.  Psychiatric: Normal judgment and insight. Alert and oriented x 3.  Depressed mood.   EKG: independently reviewed, showing sinus rhythm with rate of 71, QTc 425  Chest x-ray on Admission: I personally reviewed and I agree with radiologist reading as below.  CT Head Wo Contrast  Result Date: 03/07/2023 CLINICAL DATA:  Moderate to severe head trauma. Seizures. Witnessed seizure with fall, striking back of head. Laceration with staples earlier this morning. EXAM: CT HEAD WITHOUT CONTRAST CT CERVICAL SPINE WITHOUT CONTRAST TECHNIQUE: Multidetector CT imaging of the head and cervical spine was performed following the standard protocol without intravenous contrast. Multiplanar CT image reconstructions of the cervical spine were also generated. RADIATION DOSE REDUCTION: This exam was performed according to the departmental dose-optimization program which includes automated exposure control, adjustment of the mA and/or kV according to patient size and/or use of iterative reconstruction technique. COMPARISON:  03/07/2023 at 09:29 a.m. FINDINGS: CT HEAD FINDINGS Brain: Streak artifact from surgical hardware severely limits the examination. Bilateral deep white matter stimulators are present, arising from posterior parietal region with tips terminating in the medial temporal lobes. Mild ventricular dilatation is unchanged since prior study. No mass-effect or midline shift. No abnormal extra-axial fluid collections are visible. No radiographic evidence of acute intracranial hemorrhage. Vascular: No hyperdense vessel or unexpected calcification. Skull: Postoperative changes with right frontoparietal temporal craniotomies and implanted device in the defect. Sinuses/Orbits: Paranasal sinuses and mastoid air cells are clear. Other: None. CT CERVICAL SPINE FINDINGS Alignment: Straightening of the usual cervical lordosis without anterior subluxation. This is likely positional. Normal alignment  of the facet joints. Skull base and vertebrae: No acute fracture. No primary bone lesion or focal pathologic process. Soft tissues and spinal canal: No prevertebral fluid or swelling. No visible canal hematoma. Disc levels: Degenerative changes with disc space narrowing and small osteophyte formation throughout. Degenerative changes in the facet joints. Old ununited ossicles at the odontoid tip are likely degenerative. Upper chest: Motion artifact.  No definite abnormality. Other: None. IMPRESSION: 1. No significant changes since prior study from earlier today. 2. No acute intracranial abnormalities are demonstrated. Postoperative changes as described. 3. Nonspecific straightening of the usual cervical lordosis with mild degenerative changes. No acute displaced fractures are identified. Electronically Signed   By: Burman Nieves M.D.   On: 03/07/2023 20:40   CT HEAD WO CONTRAST ( )  Result Date: 03/07/2023 CLINICAL DATA:  Head trauma, abnormal mental status (Age 42-64y); Neck trauma, impaired ROM (Age 101-64y). EXAM: CT HEAD WITHOUT CONTRAST CT CERVICAL SPINE WITHOUT CONTRAST TECHNIQUE: Multidetector CT imaging of the head and cervical spine was performed following the standard protocol without intravenous contrast. Multiplanar CT image reconstructions of the cervical spine were also generated. RADIATION DOSE REDUCTION: This exam was performed according to the departmental dose-optimization program which includes automated exposure control, adjustment of the mA and/or kV according to patient size and/or use of iterative reconstruction technique. COMPARISON:  CT scan head from 11/24/2020 CT scan head/cervical spine from 06/15/2020. FINDINGS: CT HEAD FINDINGS Brain: Examination is markedly limited due to extensive streak artifacts through bilateral deep brain stimulators and metallic calvarial implants. No evidence of discrete acute infarction, hemorrhage, hydrocephalus, extra-axial collection or mass  lesion/mass effect within the limitations of exam. Vascular: No hyperdense vessel or unexpected calcification. Skull: Normal. Negative for fracture or focal lesion. Sinuses/Orbits: No acute finding. Other: None. CT CERVICAL SPINE FINDINGS Alignment: Normal. This examination does not assess for ligamentous injury or stability. Skull base and vertebrae: No acute fracture. No primary bone lesion or focal pathologic process. Soft tissues and spinal canal: No prevertebral fluid or swelling. No visible canal hematoma. Disc levels: Intervertebral disc heights are maintained. Mild multilevel facet arthropathy and marginal osteophyte formation. Upper chest: Patchy ground-glass changes and smooth interlobular septal thickening noted in the bilateral lung apices. Findings are nonspecific. Other: None. IMPRESSION: 1. No acute intracranial abnormality. 2. No acute cervical spine abnormality. 3. Patchy ground-glass changes and smooth interlobular septal thickening in the bilateral lung apices. Findings are nonspecific and may represent pulmonary edema or atypical infection. Electronically Signed   By: Jules Schick M.D.   On: 03/07/2023 10:17   CT Cervical Spine Wo Contrast  Result Date: 03/07/2023 CLINICAL DATA:  Head trauma, abnormal mental status (Age 16-64y); Neck trauma, impaired ROM (Age 76-64y). EXAM: CT HEAD WITHOUT CONTRAST CT CERVICAL SPINE WITHOUT CONTRAST TECHNIQUE: Multidetector CT imaging of the head and cervical spine was performed following the standard protocol without intravenous contrast. Multiplanar CT image reconstructions of the cervical spine were also generated. RADIATION DOSE REDUCTION: This exam was performed according to the departmental dose-optimization program which includes automated exposure control, adjustment of the mA and/or kV according to patient size and/or use of iterative reconstruction technique. COMPARISON:  CT scan head from 11/24/2020 CT scan head/cervical spine from 06/15/2020.  FINDINGS: CT HEAD FINDINGS Brain: Examination is markedly limited due to extensive streak artifacts through bilateral deep brain stimulators and metallic calvarial implants. No evidence of discrete acute infarction, hemorrhage, hydrocephalus, extra-axial collection or mass lesion/mass effect within the limitations of exam. Vascular: No hyperdense vessel or unexpected calcification. Skull: Normal. Negative for fracture or focal lesion. Sinuses/Orbits: No acute finding. Other: None. CT CERVICAL SPINE FINDINGS Alignment: Normal. This examination does not assess for ligamentous injury or stability. Skull base and vertebrae: No acute fracture. No primary bone lesion or focal pathologic process. Soft tissues and spinal canal: No prevertebral fluid or swelling. No visible canal hematoma. Disc levels: Intervertebral disc heights are maintained. Mild multilevel facet arthropathy and marginal osteophyte formation. Upper chest: Patchy ground-glass changes and smooth interlobular septal thickening noted in the bilateral lung apices. Findings are nonspecific. Other: None. IMPRESSION: 1. No acute intracranial abnormality. 2. No acute cervical spine abnormality. 3. Patchy ground-glass changes and smooth interlobular septal thickening in the bilateral lung apices. Findings are nonspecific and may represent pulmonary edema or atypical infection. Electronically Signed  By: Jules Schick M.D.   On: 03/07/2023 10:17   DG Chest Portable 1 View  Result Date: 03/07/2023 CLINICAL DATA:  fall EXAM: PORTABLE CHEST 1 VIEW COMPARISON:  04/28/2021. FINDINGS: Bilateral lung fields are clear. Note is made of elevated right hemidiaphragm. Bilateral lateral costophrenic angles are clear. Normal cardio-mediastinal silhouette. A neurostimulator device battery pack is seen overlying the left middle chest. No acute osseous abnormalities. The soft tissues are within normal limits. There are surgical clips in the right upper quadrant, typical of a  previous cholecystectomy. IMPRESSION: *No acute cardiopulmonary process. Electronically Signed   By: Jules Schick M.D.   On: 03/07/2023 10:05    Labs on Admission: I have personally reviewed following labs  CBC: Recent Labs  Lab 03/07/23 1029 03/07/23 1841  WBC 6.8 11.4*  NEUTROABS 5.1  --   HGB 14.0 14.3  HCT 44.4 45.5  MCV 100.7* 100.4*  PLT 228 251   Basic Metabolic Panel: Recent Labs  Lab 03/07/23 1029 03/07/23 1841  NA 141 139  K 4.5 4.0  CL 108 106  CO2 22 20*  GLUCOSE 119* 143*  BUN 24* 21*  CREATININE 1.11* 1.04*  CALCIUM 8.9 9.1  MG 2.3  --    GFR: Estimated Creatinine Clearance: 61.3 mL/min (A) (by C-G formula based on SCr of 1.04 mg/dL (H)).  Liver Function Tests: Recent Labs  Lab 03/07/23 1029  AST 31  ALT 36  ALKPHOS 157*  BILITOT 0.3  PROT 7.1  ALBUMIN 3.9   Cardiac Enzymes: Recent Labs  Lab 03/07/23 1029  CKTOTAL 59   Urine analysis:    Component Value Date/Time   COLORURINE YELLOW (A) 03/07/2023 1133   APPEARANCEUR CLOUDY (A) 03/07/2023 1133   LABSPEC 1.020 03/07/2023 1133   PHURINE 7.0 03/07/2023 1133   GLUCOSEU NEGATIVE 03/07/2023 1133   HGBUR NEGATIVE 03/07/2023 1133   BILIRUBINUR NEGATIVE 03/07/2023 1133   BILIRUBINUR small 07/21/2020 1457   KETONESUR NEGATIVE 03/07/2023 1133   PROTEINUR NEGATIVE 03/07/2023 1133   UROBILINOGEN 0.2 07/21/2020 1457   NITRITE POSITIVE (A) 03/07/2023 1133   LEUKOCYTESUR TRACE (A) 03/07/2023 1133   This document was prepared using Dragon Voice Recognition software and may include unintentional dictation errors.  Dr. Sedalia Muta [facility]  If 7PM-7AM, please contact overnight-coverage provider If 7AM-7PM, please contact day attending provider [email or web address]  03/07/2023, 10:56 PM

## 2023-03-07 NOTE — ED Provider Notes (Signed)
[facility] Provider Note    Event Date/Time   First MD Initiated Contact with Patient 03/07/23 2042     (approximate)   History   Seizures   HPI  Mandy Deleon is a 57 y.o. female history of seizure disorder, epilepsy  Notably patient was seen earlier today after a seizure was also diagnosed with urinary tract infection.  Admission was recommended at that time but patient requested to go home and was discharged.  Patient reports that she was at home this evening, around 7 PM she was in the shower when she notified her boyfriend that she did not feel right, and he found her with a glazed over look in her eyes and then she fell out of the shower next to the vanity and toilet  She recovered there after, and they did not observe any obvious shaking or seizure-like activity.  Patient does report a history of seizures and has had previous episodes where she is passed out as well  She is in no pain or discomfort except reports some moderate headache over the posterior scalp.  No neck pain.  No numbness tingling or weakness.  She has not yet had her evening seizure medications which she typically takes around 7 PM     Physical Exam   Triage Vital Signs: ED Triage Vitals  Encounter Vitals Group     BP 03/07/23 1840 (!) 140/99     Systolic BP Percentile --      Diastolic BP Percentile --      Pulse Rate 03/07/23 1840 (!) 114     Resp 03/07/23 1840 20     Temp 03/07/23 1840 98.3 F (36.8 C)     Temp Source 03/07/23 1840 Oral     SpO2 03/07/23 1840 95 %     Weight 03/07/23 1834 190 lb (86.2 kg)     Height 03/07/23 1834 5\' 3"  (1.6 m)     Head Circumference --      Peak Flow --      Pain Score 03/07/23 1833 10     Pain Loc --      Pain Education --      Exclude from Growth Chart --     Most recent vital signs: Vitals:   03/07/23 1840  BP: (!) 140/99  Pulse: (!) 114  Resp: 20  Temp: 98.3 F (36.8 C)  SpO2: 95%     General: Awake, no  distress.  Normocephalic atraumatic.  Sutures of the right posterior scalp in place, no bleeding or new injuries to noted.  No cervical tenderness full range of motion of neck without pain or discomfort CV:  Good peripheral perfusion.  Normal tones and rate Resp:  Normal effort.  Clear bilateral Abd:  No distention.  Soft nontender nondistended Other:  Speech is clear.  No active tremors or seizure-like activity.  She is fully awake and alert.   ED Results / Procedures / Treatments   Labs (all labs ordered are listed, but only abnormal results are displayed) Labs Reviewed  BASIC METABOLIC PANEL - Abnormal; Notable for the following components:      Result Value   CO2 20 (*)    Glucose, Bld 143 (*)    BUN 21 (*)    Creatinine, Ser 1.04 (*)    All other components within normal limits  CBC - Abnormal; Notable for the following components:   WBC 11.4 (*)    MCV 100.4 (*)  All other components within normal limits  URINE CULTURE  PROCALCITONIN  BASIC METABOLIC PANEL  CBC  LACTIC ACID, PLASMA  LACTIC ACID, PLASMA  CBG MONITORING, ED     EKG  And interpreted by me at 1150 heart rate 70 QRS 90 QTc 438 Normal sinus rhythm, no evidence of acute ischemia   RADIOLOGY  CT of the head interpreted by me as grossly negative for acute intracranial hemorrhage.  Notable artifact from previous surgery   CT Head Wo Contrast  Result Date: 03/07/2023 CLINICAL DATA:  Moderate to severe head trauma. Seizures. Witnessed seizure with fall, striking back of head. Laceration with staples earlier this morning. EXAM: CT HEAD WITHOUT CONTRAST CT CERVICAL SPINE WITHOUT CONTRAST TECHNIQUE: Multidetector CT imaging of the head and cervical spine was performed following the standard protocol without intravenous contrast. Multiplanar CT image reconstructions of the cervical spine were also generated. RADIATION DOSE REDUCTION: This exam was performed according to the departmental dose-optimization program  which includes automated exposure control, adjustment of the mA and/or kV according to patient size and/or use of iterative reconstruction technique. COMPARISON:  03/07/2023 at 09:29 a.m. FINDINGS: CT HEAD FINDINGS Brain: Streak artifact from surgical hardware severely limits the examination. Bilateral deep white matter stimulators are present, arising from posterior parietal region with tips terminating in the medial temporal lobes. Mild ventricular dilatation is unchanged since prior study. No mass-effect or midline shift. No abnormal extra-axial fluid collections are visible. No radiographic evidence of acute intracranial hemorrhage. Vascular: No hyperdense vessel or unexpected calcification. Skull: Postoperative changes with right frontoparietal temporal craniotomies and implanted device in the defect. Sinuses/Orbits: Paranasal sinuses and mastoid air cells are clear. Other: None. CT CERVICAL SPINE FINDINGS Alignment: Straightening of the usual cervical lordosis without anterior subluxation. This is likely positional. Normal alignment of the facet joints. Skull base and vertebrae: No acute fracture. No primary bone lesion or focal pathologic process. Soft tissues and spinal canal: No prevertebral fluid or swelling. No visible canal hematoma. Disc levels: Degenerative changes with disc space narrowing and small osteophyte formation throughout. Degenerative changes in the facet joints. Old ununited ossicles at the odontoid tip are likely degenerative. Upper chest: Motion artifact.  No definite abnormality. Other: None. IMPRESSION: 1. No significant changes since prior study from earlier today. 2. No acute intracranial abnormalities are demonstrated. Postoperative changes as described. 3. Nonspecific straightening of the usual cervical lordosis with mild degenerative changes. No acute displaced fractures are identified. Electronically Signed   By: Burman Nieves M.D.   On: 03/07/2023 20:40     PROCEDURES:  Critical Care performed: No  Procedures   MEDICATIONS ORDERED IN ED: Medications  levETIRAcetam (KEPPRA) tablet 750 mg (has no administration in time range)  zonisamide (ZONEGRAN) capsule 100 mg (has no administration in time range)  lactated ringers infusion (has no administration in time range)  acetaminophen (TYLENOL) tablet 650 mg (has no administration in time range)    Or  acetaminophen (TYLENOL) suppository 650 mg (has no administration in time range)  ondansetron (ZOFRAN) tablet 4 mg ( Oral See Alternative 03/07/23 2137)    Or  ondansetron (ZOFRAN) injection 4 mg (4 mg Intravenous Given 03/07/23 2137)  enoxaparin (LOVENOX) injection 42.5 mg (has no administration in time range)  lactated ringers bolus 1,000 mL (has no administration in time range)  cefTRIAXone (ROCEPHIN) 2 g in sodium chloride 0.9 % 100 mL IVPB (2 g Intravenous New Bag/Given 03/07/23 2146)  azithromycin (ZITHROMAX) 500 mg in sodium chloride 0.9 % 250 mL IVPB (has  no administration in time range)  senna-docusate (Senokot-S) tablet 1 tablet (has no administration in time range)  LORazepam (ATIVAN) injection 1 mg (has no administration in time range)  hydrALAZINE (APRESOLINE) injection 5 mg (has no administration in time range)  sodium chloride 0.9 % bolus 1,000 mL (1,000 mLs Intravenous New Bag/Given 03/07/23 2141)     IMPRESSION / MDM / ASSESSMENT AND PLAN / ED COURSE  I reviewed the triage vital signs and the nursing notes.                              Differential diagnosis includes, but is not limited to, syncope, recurrent seizure, urinary tract infection, hypotension, orthostasis, sepsis etc.  She is mildly tachycardic but does not have leukocytosis greater than 12 and respiratory rate greater than 20.  She is not febrile.  She does not presently meet sepsis criteria  The clinical history she provide some concern she had a syncopal episode this evening.  This is in the setting of  suspected urinary tract infection.  She has no acute abdominal pain chest pain shortness of breath or fever.  She does have a history also complicated epilepsy on multiple seizure medications  Will continue her home zonisamide and Keppra  Repeat labs show white blood cell count of 11.4.  Normal troponin.  No chest pain no signs or symptoms would be highly suggestive of ACS or acute preceding arrhythmia.  Chemistry notable for slightly reduced CO2 of 20  Patient's presentation is most consistent with acute complicated illness / injury requiring diagnostic workup.   The patient is on the cardiac monitor to evaluate for evidence of arrhythmia and/or significant heart rate changes.   ----------------------------------------- 9:47 PM on 03/07/2023 ----------------------------------------- Patient agreeable with plan for admission.  Started on IV fluids, Rocephin, consulted with and patient accepted to hospitalist by Dr. Sedalia Muta     FINAL CLINICAL IMPRESSION(S) / ED DIAGNOSES   Final diagnoses:  Syncope and collapse  Urinary tract infection, acute     Rx / DC Orders   ED Discharge Orders     None        Note:  This document was prepared using Dragon voice recognition software and may include unintentional dictation errors.   Sharyn Creamer, MD 03/07/23 2147

## 2023-03-07 NOTE — Assessment & Plan Note (Addendum)
Present on admission Ceftriaxone 2 g IV daily, 5 days ordered

## 2023-03-07 NOTE — ED Notes (Signed)
Pt to ct scan.

## 2023-03-07 NOTE — Progress Notes (Signed)
CODE SEPSIS - PHARMACY COMMUNICATION  **Broad Spectrum Antibiotics should be administered within 1 hour of Sepsis diagnosis**  Time Code Sepsis Called/Page Received: 2117  Antibiotics Ordered: ceftriaxone and azithromycin  Time of 1st antibiotic administration: 2145  Additional action taken by pharmacy: None  If necessary, Name of Provider/Nurse Contacted: None    Rockwell Alexandria ,PharmD Clinical Pharmacist  03/07/2023  9:59 PM

## 2023-03-07 NOTE — Assessment & Plan Note (Addendum)
Possible decrease in seizure threshold in setting of UTI or community-acquired pneumonia, if patient continues to have unwitnessed syncope or seizure episodes in the hospital, would recommend a.m. team to consult neurology Patient reports compliance with her seizure medication Home Keppra 750 mg p.o. twice daily, mesuximide 200 mg per home dosing instructions, zonisamide 100 mg p.o. per home dosing resumed on admission Seizure precaution, fall precaution, aspiration precaution

## 2023-03-07 NOTE — ED Triage Notes (Signed)
57 yo F arrives via [facility] in C-collar  d/t fall Pt reports Hx of seizures, Pt doesn't remember events that lead up to fall or how long she had been lying on the ground. Pt was discovered by a stranger and they called EMS Pt has a posterior head laceration covered by EMS with [facility] reports Pt has a shunt but unsure what kind EMS provided no Tx, or IV placed. Pt reports she may have blacked out/.

## 2023-03-07 NOTE — Assessment & Plan Note (Signed)
Precaution, aspiration precaution

## 2023-03-07 NOTE — ED Provider Notes (Addendum)
[facility] Provider Note    Event Date/Time   First MD Initiated Contact with Patient 03/07/23 [phone]     (approximate)   History   Fall (Fall w/suspected seizure)   HPI  Mandy Deleon is a 57 y.o. female with history of seizures who comes in with concern for loss of consciousness.  I reviewed patient's neurology note from 9/10.  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  She is on multiple antiseizure medicine..  That she does have a vagal nerve stimulator.  Patient reports that she does remember waking up this morning and does state that she took her medications this morning however she does not remember anything after that.  Patient was found outside but she does not know how many stairs it was that she fell down.  A neighbor found her.  Unclear exactly how long she was on the ground for.  She denies any chest pain, shortness of breath, abdominal pain, arm or leg weakness.  Denies any tingling.  She does have a laceration noted to the back of her right head.    cenobamate (XCOPRI) 100 mg tablet TAKE ONE TABLET BY MOUTH DAILY. ON WEDNESDAY  methsuximide (CELONTIN) 300 mg cap Take by mouth. Take 1 capsule on Tuesdays and Thursdays  KEPPRA 750 mg tablet TAKE ONE TABLET BY MOUTH TWICE A DAY 60 tablet 11  Per patient is taking on Mondays and Fridays  zonisamide (ZONEGRAN) 100 MG capsule TAKE ONE CAPSULE BY MOUTH THREE TIMES A DAY 90 capsule 11  cenobamate (XCOPRI) tablet Take 200 mg by mouth daily.   Physical Exam   Triage Vital Signs: ED Triage Vitals [03/07/23 0905]  Encounter Vitals Group     BP      Systolic BP Percentile      Diastolic BP Percentile      Pulse      Resp      Temp      Temp src      SpO2 100 %     Weight      Height      Head Circumference      Peak Flow      Pain Score      Pain Loc      Pain Education      Exclude from Growth Chart     Most recent vital signs: Vitals:   03/07/23  0905  SpO2: 100%     General: Awake, no distress.  CV:  Good peripheral perfusion.  Resp:  Normal effort.  Abd:  No distention.  Other:  Laceration to the right parietal area of her head.  C-collar in place.  No chest wall tenderness no abdominal tenderness full range of motion of all extremities.  She does have some stuttering noted with her speech but she reports that this is baseline.  Does seem confused however which she states that she is not sure if that is new or not.  No other weakness or tingling noted in her extremities.   ED Results / Procedures / Treatments   Labs (all labs ordered are listed, but only abnormal results are displayed) Labs Reviewed  CBC WITH DIFFERENTIAL/PLATELET  COMPREHENSIVE METABOLIC PANEL  CK  MAGNESIUM  URINALYSIS, ROUTINE W REFLEX MICROSCOPIC  ETHANOL  URINE DRUG SCREEN, QUALITATIVE ([facility] ONLY)  TROPONIN I (HIGH SENSITIVITY)     EKG  My interpretation of EKG:  Sinus rate of 86 without any  ST elevation, T wave version in V1, QTc elevated at 594  Sinus rate of 71 without any ST elevation or T wave inversions, normal intervals  RADIOLOGY I have reviewed the CT head personally interpreted no evidence of intracranial hemorrhage  PROCEDURES:  Critical Care performed: No  ..Laceration Repair  Date/Time: 03/07/2023 10:10 AM  Performed by: Concha Se, MD Authorized by: Concha Se, MD   Consent:    Consent obtained:  Verbal   Consent given by:  Parent and patient   Risks discussed:  Infection, pain and retained foreign body Universal protocol:    Patient identity confirmed:  Verbally with patient Anesthesia:    Anesthesia method:  None Laceration details:    Location:  Scalp   Length (cm):  3   Depth (mm):  5 Treatment:    Area cleansed with:  Saline   Amount of cleaning:  Standard   Irrigation method:  Syringe Skin repair:    Repair method:  Staples   Number of staples:  4 Approximation:    Approximation:   Close Repair type:    Repair type:  Simple Post-procedure details:    Dressing:  Open (no dressing)   Procedure completion:  Tolerated well, no immediate complications    MEDICATIONS ORDERED IN ED: Medications  Tdap (BOOSTRIX) injection 0.5 mL (0.5 mLs Intramuscular Not Given 03/07/23 1015)  lidocaine-EPINEPHrine (XYLOCAINE W/EPI) 2 %-1:200000 (PF) injection (has no administration in time range)  acetaminophen (TYLENOL) tablet 1,000 mg (1,000 mg Oral Given 03/07/23 1023)     IMPRESSION / MDM / ASSESSMENT AND PLAN / ED COURSE  I reviewed the triage vital signs and the nursing notes.   Patient's presentation is most consistent with acute presentation with potential threat to life or bodily function.   Patient comes in with a fall.  Could be secondary to seizure, syncope, mechanical in nature.  Patient did have LOC does not remember.  No witnessed seizure activity by EMS.  She reports to be compliant with her medications.  Differential includes intracranial hemorrhage, seizure, syncope.  No evidence of fracture based upon examination.  Does have laceration.  10:11 AM patient seems a little bit less confused at this time.  Laceration was repaired.  Alcohol level is negative CBC normal CK reassuring.  12:01 PM reevaluated patient she feels much like at her baseline self she is ambulatory states that she supposed to use a cane at home but does not use it.  She does have a boyfriend that she can stay with that does not have any steps of the house.  We discussed that this could have been a seizure versus syncopal versus mechanical fall that caused some amnesia to the event.  Her CK is normal which is reassuring and blood work so far has been reassuring.  I discussed admission for seizure monitoring, cardiac monitoring but at this time patient would prefer to go home.  Troponin is negative.  Urine with concern for possible UTI will add on urine culture.  Discussed with patient recommended  admission for UTI causing possible syncope versus seizure versus mechanical fall and some amnesia from hitting her head.  Patient has not had any additional seizure-like activity for over 4 hours.  She reports feeling much better.  She has been ambulatory and states that she is supposed to use a cane but does not use it.  She states that she is willing to use it.  She has a boyfriend that is in the room who is  going to be with her for the next 24 hours.  At this time she does not want to be admitted to the hospital and prefers to go home therefore patient will be discharged home  Patient did have some low blood pressures where she was laying on her side so this seems more positional in nature.  I did discuss with patient if she was having any respiratory symptoms given concern for some infiltrates noted and she denies any cough shortness of breath or other concerns.  Denies any fluid overload.     The patient is on the cardiac monitor to evaluate for evidence of arrhythmia and/or significant heart rate changes.      FINAL CLINICAL IMPRESSION(S) / ED DIAGNOSES   Final diagnoses:  Fall, initial encounter  LOC (loss of consciousness) (HCC)  Laceration of scalp without foreign body, initial encounter     Rx / DC Orders   ED Discharge Orders     None        Note:  This document was prepared using Dragon voice recognition software and may include unintentional dictation errors.   Concha Se, MD 03/07/23 1349    Concha Se, MD 03/07/23 1540

## 2023-03-07 NOTE — Assessment & Plan Note (Signed)
Hydralazine 5 mg IV every 8 hours as needed for SBP greater 170, 4 days ordered

## 2023-03-08 DIAGNOSIS — G40909 Epilepsy, unspecified, not intractable, without status epilepticus: Secondary | ICD-10-CM

## 2023-03-08 DIAGNOSIS — J189 Pneumonia, unspecified organism: Secondary | ICD-10-CM | POA: Diagnosis not present

## 2023-03-08 DIAGNOSIS — R55 Syncope and collapse: Secondary | ICD-10-CM | POA: Diagnosis not present

## 2023-03-08 LAB — CBC
HCT: 33.7 % — ABNORMAL LOW (ref 36.0–46.0)
Hemoglobin: 11.5 g/dL — ABNORMAL LOW (ref 12.0–15.0)
MCH: 32.1 pg (ref 26.0–34.0)
MCHC: 34.1 g/dL (ref 30.0–36.0)
MCV: 94.1 fL (ref 80.0–100.0)
Platelets: 205 10*3/uL (ref 150–400)
RBC: 3.58 MIL/uL — ABNORMAL LOW (ref 3.87–5.11)
RDW: 13.2 % (ref 11.5–15.5)
WBC: 7.8 10*3/uL (ref 4.0–10.5)
nRBC: 0 % (ref 0.0–0.2)

## 2023-03-08 LAB — BASIC METABOLIC PANEL
Anion gap: 5 (ref 5–15)
BUN: 16 mg/dL (ref 6–20)
CO2: 21 mmol/L — ABNORMAL LOW (ref 22–32)
Calcium: 7.8 mg/dL — ABNORMAL LOW (ref 8.9–10.3)
Chloride: 110 mmol/L (ref 98–111)
Creatinine, Ser: 0.87 mg/dL (ref 0.44–1.00)
GFR, Estimated: >60 mL/min (ref >60)
Glucose, Bld: 124 mg/dL — ABNORMAL HIGH (ref 70–99)
Potassium: 3.6 mmol/L (ref 3.5–5.1)
Sodium: 136 mmol/L (ref 135–145)

## 2023-03-08 LAB — LACTIC ACID, PLASMA: Lactic Acid, Venous: 2.5 mmol/L (ref 0.5–1.9)

## 2023-03-08 MED ORDER — ZONISAMIDE 100 MG PO CAPS: 100.0000 mg | ORAL_CAPSULE | Freq: Three times a day (TID) | ORAL | 40 refills | Status: AC

## 2023-03-08 NOTE — Care Management CC44 (Signed)
Condition Code 44 Documentation Completed  Patient Details  Name: Mandy Deleon MRN: [id number] Date of Birth: 12/09/1965   Condition Code 44 given:  Yes Patient signature on Condition Code 44 notice:  Yes Documentation of 2 MD's agreement:  Yes Code 44 added to claim:  Yes    Allena Katz, LCSW 03/08/2023, 12:22 PM

## 2023-03-08 NOTE — TOC CM/SW Note (Signed)
Transition of Care [facility]) - Inpatient Brief Assessment   Patient Details  Name: Mandy Deleon MRN: [id number] Date of Birth: Jun 09, 1965  [facility]) CM/SW Contact:    Allena Katz, LCSW Phone Number: 03/08/2023, 9:16 AM   Clinical Narrative:    Transition of Care Asessment:   Patient has primary care physician: Yes Home environment has been reviewed: home with boyfriend Prior level of function:: independent Prior/Current Home Services: Current home services Social Determinants of Health Reivew: SDOH reviewed no interventions necessary Readmission risk has been reviewed: No Transition of care needs: no transition of care needs at this time

## 2023-03-08 NOTE — Discharge Summary (Signed)
Physician Discharge Summary   Patient: Mandy Deleon MRN: [id number]  DOB: 21-Jul-1965   Admit:     Date of Admission: 03/07/2023 Admitted from: home   Discharge: Date of discharge: 03/08/23 Disposition: Home Condition at discharge: good  CODE STATUS: FULL CODE     Discharge Physician: Mandy Nielsen, DO [facility]     PCP: Mandy Downer, MD  Recommendations for Outpatient Follow-up:  Follow up with PCP Mandy Flock Marzella Schlein, MD in 1-2 weeks Follow up with neurology asap    Discharge Instructions     Diet - low sodium heart healthy   Complete by: As directed    Increase activity slowly   Complete by: As directed    No wound care   Complete by: As directed          Discharge Diagnoses: Principal Problem:   Community acquired pneumonia Active Problems:   Nonintractable generalized idiopathic epilepsy without status epilepticus (HCC)   Essential hypertension   Frequent falls   Leukocytosis   Pyuria   CAP (community acquired pneumonia)       Hospital Course: HPI: Mandy Deleon is a 57 female with history of simple partial, complex partial generalized tonic-clonic seizure disorder, who presents emergency department for chief concerns of syncope - recalls waking up and taking her medications but nothing after that. Patient was found outside but she does not know how many stairs it was that she fell down. A neighbor found her. Unclear exactly how long she was on the ground. No witnessed seizure activity by EMS. Came to ED and was discharged at her request following overall reassuring w/u and laceration repair to head. Went home, reports event in the shower when she notified her boyfriend that she did not feel right, and he found her with a glazed over look in her eyes and then she fell out of the shower next to the vanity and toilet, (+)head trauma, returned to ED. ED treatment: Keppra 750 mg p.o. one-time dose, Zonegran 100 mg, ceftriaxone 1 g  IV, sodium chloride 1 L bolus.  Of note, neurology visit 02/15/2023 notes reviewed: extensive history w/ multiple med regimens, drug intolerances over the years; at that visit her methsuximide (Celontin) was changed from 300 mg po Tues/Thurs to 300 mg po once per week. Vagal nerve stimulator was interrogated.     Hospital course / significant events:  10/01: second ED fivist, repeat CT head no concerns, CT C-spine concerning for groundglass changes and smooth interlobular septal thickening of bilateral lung apices. UA (+)nitrites, trace leuk.  10/02: appreciate pharmacy confirming Rx. D/w neurology, would not make changes at this time, recs treat infection (avoid FQ abx) and f/u w/ outpatient neurologist.   Consultants:  neurology  Procedures/Surgeries: none      ASSESSMENT & PLAN:   Nonintractable generalized idiopathic epilepsy without status epilepticus Possible decrease in seizure threshold in setting of UTI or community-acquired pneumonia, if patient continues to have unwitnessed syncope or seizure episodes in the hospital, would recommend a.m. team to consult neurology Patient reports compliance with her seizure medication Neurostimulator device in place  Past anticonvulsants tried (per most recent outpatient note): Aptiom, Tegretol, Phenobarbital, Dilantin, Neurontin, Depakote, Lamictal, Tranxene, Felbamate, Topamax, Trileptal, and Diamox.  Home Keppra 750 mg p.o. twice daily, mesuximide 200 mg per home dosing instructions, zonisamide 100 mg p.o. per home dosing resumed on admission Seizure precaution, fall precaution, aspiration precaution  Community acquired pneumonia ruled out - clear CXR and no symptoms  Possible atypical pneumonia on bilateral apices. Check procalcitonin --> no concerns Check lactic acid x 2 --> mild elevation 2.5 on second check  Was started on azithromycin 500 mg IV daily, ceftriaxone 2 g IV daily --> d/c    Pyuria, complicated UTI w/ dx DM2 Present  on admission Ceftriaxone 2 g IV daily, 5 days ordered on admission --> cipro on discharge   Leukocytosis - resolved Suspect secondary to community-acquired pneumonia versus UTI versus inflammatory, present on admission  Treat underlying causes above Monitor CBC   Frequent falls Fall Precaution, aspiration precaution   Essential hypertension Follow outpatient            Discharge Instructions  Allergies as of 03/08/2023       Reactions   Avocado Anaphylaxis   Aspirin Other (See Comments)   Aspirin and all aspirin products are not compatible with her seizure medications.   Divalproex Sodium    Designed for genetic epilepsy, not encephalitic   Hydroxyzine Other (See Comments)   Caused her to go into cluster seizures   Lamotrigine Other (See Comments)   Designed for generic epilepsy, not encephalitic   Morphine And Codeine Other (See Comments)   Made her feel like she was floating.  That is how she feels when having a seizure. It can cause me to go into a seizure   Phenobarbital Other (See Comments)   Designed for genetic epilepsy, not encephalitic   Phenytoin Sodium Extended Other (See Comments)   Designed for genetic epilepsy, not encephalitic   Prednisone Other (See Comments)   Caused her to go into cluster seizures   Zoloft [sertraline] Other (See Comments)   Made seizures worse   Other    Adhesive [tape] Other (See Comments)   Welt to skin. Rash PAPER TAPE IS OKAY   Ativan [lorazepam] Nausea And Vomiting   Carbamazepine Other (See Comments)   Designed for genetic epilepsy, not encephalitic   Codeine Other (See Comments)   "makes me loopy"   Iodine Rash   Iodine on the skin causes hives   Montelukast Other (See Comments)   Patient was told that it was best that she NOT take this medication   Topiramate Other (See Comments)   Did not work for patient        Medication List     TAKE these medications    acetaminophen 500 MG tablet Commonly known  as: TYLENOL Take 1,000 mg by mouth every 6 (six) hours as needed (pain.).   Celontin 300 MG Caps Generic drug: Methsuximide Take 300 mg by mouth once a week. Friday at night   cephALEXin 500 MG capsule Commonly known as: KEFLEX Take 1 capsule (500 mg total) by mouth 2 (two) times daily for 7 days.   cetirizine 10 MG tablet Commonly known as: ZYRTEC TAKE ONE TABLET BY MOUTH ONCE DAILY   cholecalciferol 25 MCG (1000 UNIT) tablet Commonly known as: VITAMIN D3 Take 1,000 Units by mouth daily.   DELSYM COUGH/COLD DAYTIME PO Take by mouth.   fluticasone 50 MCG/ACT nasal spray Commonly known as: FLONASE Place 2 sprays into both nostrils daily.   levETIRAcetam 750 MG tablet Commonly known as: KEPPRA Take 750 mg by mouth 2 (two) times daily.   Melatonin 5 MG Caps Take 5 mg by mouth at bedtime.   OMEGA-3 + D PO Take 1 capsule by mouth daily in the afternoon.   ondansetron 4 MG tablet Commonly known as: ZOFRAN TAKE ONE TABLET BY MOUTH EVERY EIGHT HOURS  AS NEEDED FOR NAUSEA OR VOMITING   oxybutynin 10 MG 24 hr tablet Commonly known as: DITROPAN-XL TAKE ONE TABLET BY MOUTH EVERY NIGHT AT BEDTIME   Xcopri 100 MG Tabs Generic drug: Cenobamate Take by mouth at bedtime. 100mg  every morning, 200mg  qhs, except 300mg  qhs on Wed   zonisamide 100 MG capsule Commonly known as: ZONEGRAN Take 1 capsule (100 mg total) by mouth in the morning, at noon, and at bedtime.          Allergies  Allergen Reactions   Avocado Anaphylaxis   Aspirin Other (See Comments)    Aspirin and all aspirin products are not compatible with her seizure medications.   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  That is how she  feels when having a seizure. 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 Rash PAPER TAPE IS OKAY   Ativan [Lorazepam] Nausea And Vomiting   Carbamazepine Other (See Comments)    Designed for genetic epilepsy, not encephalitic   Codeine Other (See Comments)    "makes me loopy"   Iodine Rash    Iodine on the skin causes hives   Montelukast Other (See Comments)    Patient was told that it was best that she NOT take this medication   Topiramate Other (See Comments)    Did not work for patient     Subjective: pt reports feeling better this morning, still quite tired, eager for discharge if possible, no further sycope/seizure episodes   Discharge Exam: BP (!) 110/52 (BP Location: Left Arm)   Pulse 88   Temp 98.9 F (37.2 C)   Resp 18   Ht 5\' 3"  (1.6 m)   Wt 87.3 kg   SpO2 94%   BMI 34.09 kg/m  General: Pt is alert, awake, not in acute distress Cardiovascular: RRR, S1/S2 +, no rubs, no gallops Respiratory: CTA bilaterally, no wheezing, no rhonchi Abdominal: Soft, NT, ND, bowel sounds + Extremities: no edema, no cyanosis     The results of significant diagnostics from this hospitalization (including imaging, microbiology, ancillary and laboratory) are listed below for reference.     Microbiology: No results found for this or any previous visit (from the past 240 hour(s)).   Labs: BNP (last 3 results) No results for input(s): "BNP" in the last 8760 hours. Basic Metabolic Panel: Recent Labs  Lab 03/07/23 1029 03/07/23 1841 03/08/23 0536  NA 141 139 136  K 4.5 4.0 3.6  CL 108 106  110  CO2 22 20* 21*  GLUCOSE 119* 143* 124*  BUN 24* 21* 16  CREATININE 1.11* 1.04* 0.87  CALCIUM 8.9 9.1 7.8*  MG 2.3  --   --    Liver Function Tests: Recent Labs  Lab 03/07/23 1029  AST 31  ALT 36  ALKPHOS 157*  BILITOT 0.3  PROT 7.1  ALBUMIN 3.9   No results for input(s): "LIPASE", "AMYLASE" in the last 168 hours. No results for input(s): "AMMONIA" in the last 168 hours. CBC: Recent Labs  Lab 03/07/23 1029 03/07/23 1841 03/08/23 0536  WBC 6.8 11.4* 7.8  NEUTROABS 5.1  --   --   HGB 14.0 14.3 11.5*  HCT 44.4 45.5 33.7*  MCV 100.7* 100.4* 94.1  PLT 228 251 205   Cardiac Enzymes: Recent Labs  Lab 03/07/23 1029  CKTOTAL 59   BNP: Invalid input(s): "POCBNP" CBG: No results for input(s): "GLUCAP" in the last 168 hours. D-Dimer No results for input(s): "DDIMER" in the last 72 hours. Hgb A1c No results for input(s): "HGBA1C" in the last 72 hours. Lipid Profile No results for input(s): "CHOL", "HDL", "LDLCALC", "TRIG", "CHOLHDL", "LDLDIRECT" in the last 72 hours. Thyroid function studies No results for input(s): "TSH", "T4TOTAL", "T3FREE", "THYROIDAB" in the last 72 hours.  Invalid input(s): "FREET3" Anemia work up No results for input(s): "VITAMINB12", "FOLATE", "FERRITIN", "TIBC", "IRON", "RETICCTPCT" in the last 72 hours. Urinalysis    Component Value Date/Time   COLORURINE YELLOW (A) 03/07/2023 1133   APPEARANCEUR CLOUDY (A) 03/07/2023 1133   LABSPEC 1.020 03/07/2023 1133   PHURINE 7.0 03/07/2023 1133   GLUCOSEU NEGATIVE 03/07/2023 1133   HGBUR NEGATIVE 03/07/2023 1133   BILIRUBINUR NEGATIVE 03/07/2023 1133   BILIRUBINUR small 07/21/2020 1457   KETONESUR NEGATIVE 03/07/2023 1133   PROTEINUR NEGATIVE 03/07/2023 1133   UROBILINOGEN 0.2 07/21/2020 1457   NITRITE POSITIVE (A) 03/07/2023 1133   LEUKOCYTESUR TRACE (A) 03/07/2023 1133   Sepsis Labs Recent Labs  Lab 03/07/23 1029 03/07/23 1841 03/08/23 0536  WBC 6.8 11.4* 7.8   Microbiology No  results found for this or any previous visit (from the past 240 hour(s)). Imaging CT Head Wo Contrast  Result Date: 03/07/2023 CLINICAL DATA:  Moderate to severe head trauma. Seizures. Witnessed seizure with fall, striking back of head. Laceration with staples earlier this morning. EXAM: CT HEAD WITHOUT CONTRAST CT CERVICAL SPINE WITHOUT CONTRAST TECHNIQUE: Multidetector CT imaging of the head and cervical spine was performed following the standard protocol without intravenous contrast. Multiplanar CT image reconstructions of the cervical spine were also generated. RADIATION DOSE REDUCTION: This exam was performed according to the departmental dose-optimization program which includes automated exposure control, adjustment of the mA and/or kV according to patient size and/or use of iterative reconstruction technique. COMPARISON:  03/07/2023 at 09:29 a.m. FINDINGS: CT HEAD FINDINGS Brain: Streak artifact from surgical hardware severely limits the examination. Bilateral deep white matter stimulators are present, arising from posterior parietal region with tips terminating in the medial temporal lobes. Mild ventricular dilatation is unchanged since prior study. No mass-effect or midline shift. No abnormal extra-axial fluid collections are visible. No radiographic evidence of acute intracranial hemorrhage. Vascular: No hyperdense vessel or unexpected calcification. Skull: Postoperative changes with right frontoparietal temporal craniotomies and implanted device in the defect. Sinuses/Orbits: Paranasal sinuses and mastoid air cells are clear. Other: None. CT CERVICAL SPINE FINDINGS Alignment: Straightening of the usual cervical lordosis without anterior subluxation. This is likely positional. Normal alignment of the facet joints. Skull base and vertebrae: No acute fracture. No primary bone lesion or focal pathologic process. Soft tissues and spinal canal: No prevertebral fluid or swelling. No visible canal hematoma.  Disc levels: Degenerative changes with disc space narrowing and small osteophyte formation throughout. Degenerative changes in the facet  joints. Old ununited ossicles at the odontoid tip are likely degenerative. Upper chest: Motion artifact.  No definite abnormality. Other: None. IMPRESSION: 1. No significant changes since prior study from earlier today. 2. No acute intracranial abnormalities are demonstrated. Postoperative changes as described. 3. Nonspecific straightening of the usual cervical lordosis with mild degenerative changes. No acute displaced fractures are identified. Electronically Signed   By: Burman Nieves M.D.   On: 03/07/2023 20:40   CT Cervical Spine Wo Contrast  Result Date: 03/07/2023 CLINICAL DATA:  Moderate to severe head trauma. Seizures. Witnessed seizure with fall, striking back of head. Laceration with staples earlier this morning. EXAM: CT HEAD WITHOUT CONTRAST CT CERVICAL SPINE WITHOUT CONTRAST TECHNIQUE: Multidetector CT imaging of the head and cervical spine was performed following the standard protocol without intravenous contrast. Multiplanar CT image reconstructions of the cervical spine were also generated. RADIATION DOSE REDUCTION: This exam was performed according to the departmental dose-optimization program which includes automated exposure control, adjustment of the mA and/or kV according to patient size and/or use of iterative reconstruction technique. COMPARISON:  03/07/2023 at 09:29 a.m. FINDINGS: CT HEAD FINDINGS Brain: Streak artifact from surgical hardware severely limits the examination. Bilateral deep white matter stimulators are present, arising from posterior parietal region with tips terminating in the medial temporal lobes. Mild ventricular dilatation is unchanged since prior study. No mass-effect or midline shift. No abnormal extra-axial fluid collections are visible. No radiographic evidence of acute intracranial hemorrhage. Vascular: No hyperdense vessel  or unexpected calcification. Skull: Postoperative changes with right frontoparietal temporal craniotomies and implanted device in the defect. Sinuses/Orbits: Paranasal sinuses and mastoid air cells are clear. Other: None. CT CERVICAL SPINE FINDINGS Alignment: Straightening of the usual cervical lordosis without anterior subluxation. This is likely positional. Normal alignment of the facet joints. Skull base and vertebrae: No acute fracture. No primary bone lesion or focal pathologic process. Soft tissues and spinal canal: No prevertebral fluid or swelling. No visible canal hematoma. Disc levels: Degenerative changes with disc space narrowing and small osteophyte formation throughout. Degenerative changes in the facet joints. Old ununited ossicles at the odontoid tip are likely degenerative. Upper chest: Motion artifact.  No definite abnormality. Other: None. IMPRESSION: 1. No significant changes since prior study from earlier today. 2. No acute intracranial abnormalities are demonstrated. Postoperative changes as described. 3. Nonspecific straightening of the usual cervical lordosis with mild degenerative changes. No acute displaced fractures are identified. Electronically Signed   By: Burman Nieves M.D.   On: 03/07/2023 20:40   CT HEAD WO CONTRAST ( )  Result Date: 03/07/2023 CLINICAL DATA:  Head trauma, abnormal mental status (Age 84-64y); Neck trauma, impaired ROM (Age 61-64y). EXAM: CT HEAD WITHOUT CONTRAST CT CERVICAL SPINE WITHOUT CONTRAST TECHNIQUE: Multidetector CT imaging of the head and cervical spine was performed following the standard protocol without intravenous contrast. Multiplanar CT image reconstructions of the cervical spine were also generated. RADIATION DOSE REDUCTION: This exam was performed according to the departmental dose-optimization program which includes automated exposure control, adjustment of the mA and/or kV according to patient size and/or use of iterative reconstruction  technique. COMPARISON:  CT scan head from 11/24/2020 CT scan head/cervical spine from 06/15/2020. FINDINGS: CT HEAD FINDINGS Brain: Examination is markedly limited due to extensive streak artifacts through bilateral deep brain stimulators and metallic calvarial implants. No evidence of discrete acute infarction, hemorrhage, hydrocephalus, extra-axial collection or mass lesion/mass effect within the limitations of exam. Vascular: No hyperdense vessel or unexpected calcification. Skull: Normal. Negative for fracture or focal  lesion. Sinuses/Orbits: No acute finding. Other: None. CT CERVICAL SPINE FINDINGS Alignment: Normal. This examination does not assess for ligamentous injury or stability. Skull base and vertebrae: No acute fracture. No primary bone lesion or focal pathologic process. Soft tissues and spinal canal: No prevertebral fluid or swelling. No visible canal hematoma. Disc levels: Intervertebral disc heights are maintained. Mild multilevel facet arthropathy and marginal osteophyte formation. Upper chest: Patchy ground-glass changes and smooth interlobular septal thickening noted in the bilateral lung apices. Findings are nonspecific. Other: None. IMPRESSION: 1. No acute intracranial abnormality. 2. No acute cervical spine abnormality. 3. Patchy ground-glass changes and smooth interlobular septal thickening in the bilateral lung apices. Findings are nonspecific and may represent pulmonary edema or atypical infection. Electronically Signed   By: Jules Schick M.D.   On: 03/07/2023 10:17   CT Cervical Spine Wo Contrast  Result Date: 03/07/2023 CLINICAL DATA:  Head trauma, abnormal mental status (Age 54-64y); Neck trauma, impaired ROM (Age 34-64y). EXAM: CT HEAD WITHOUT CONTRAST CT CERVICAL SPINE WITHOUT CONTRAST TECHNIQUE: Multidetector CT imaging of the head and cervical spine was performed following the standard protocol without intravenous contrast. Multiplanar CT image reconstructions of the cervical  spine were also generated. RADIATION DOSE REDUCTION: This exam was performed according to the departmental dose-optimization program which includes automated exposure control, adjustment of the mA and/or kV according to patient size and/or use of iterative reconstruction technique. COMPARISON:  CT scan head from 11/24/2020 CT scan head/cervical spine from 06/15/2020. FINDINGS: CT HEAD FINDINGS Brain: Examination is markedly limited due to extensive streak artifacts through bilateral deep brain stimulators and metallic calvarial implants. No evidence of discrete acute infarction, hemorrhage, hydrocephalus, extra-axial collection or mass lesion/mass effect within the limitations of exam. Vascular: No hyperdense vessel or unexpected calcification. Skull: Normal. Negative for fracture or focal lesion. Sinuses/Orbits: No acute finding. Other: None. CT CERVICAL SPINE FINDINGS Alignment: Normal. This examination does not assess for ligamentous injury or stability. Skull base and vertebrae: No acute fracture. No primary bone lesion or focal pathologic process. Soft tissues and spinal canal: No prevertebral fluid or swelling. No visible canal hematoma. Disc levels: Intervertebral disc heights are maintained. Mild multilevel facet arthropathy and marginal osteophyte formation. Upper chest: Patchy ground-glass changes and smooth interlobular septal thickening noted in the bilateral lung apices. Findings are nonspecific. Other: None. IMPRESSION: 1. No acute intracranial abnormality. 2. No acute cervical spine abnormality. 3. Patchy ground-glass changes and smooth interlobular septal thickening in the bilateral lung apices. Findings are nonspecific and may represent pulmonary edema or atypical infection. Electronically Signed   By: Jules Schick M.D.   On: 03/07/2023 10:17   DG Chest Portable 1 View  Result Date: 03/07/2023 CLINICAL DATA:  fall EXAM: PORTABLE CHEST 1 VIEW COMPARISON:  04/28/2021. FINDINGS: Bilateral lung  fields are clear. Note is made of elevated right hemidiaphragm. Bilateral lateral costophrenic angles are clear. Normal cardio-mediastinal silhouette. A neurostimulator device battery pack is seen overlying the left middle chest. No acute osseous abnormalities. The soft tissues are within normal limits. There are surgical clips in the right upper quadrant, typical of a previous cholecystectomy. IMPRESSION: *No acute cardiopulmonary process. Electronically Signed   By: Jules Schick M.D.   On: 03/07/2023 10:05      Time coordinating discharge: over 30 minutes  SIGNED:  Sunnie Nielsen DO [facility]

## 2023-03-08 NOTE — Care Management Obs Status (Signed)
MEDICARE OBSERVATION STATUS NOTIFICATION   Patient Details  Name: Mandy Deleon MRN: [id number] Date of Birth: August 08, 1965   Medicare Observation Status Notification Given:  Yes    Carmisha Larusso, LCSW 03/08/2023, 12:22 PM

## 2023-03-09 ENCOUNTER — Telehealth: Payer: Self-pay

## 2023-03-09 ENCOUNTER — Ambulatory Visit: Payer: Self-pay | Admitting: *Deleted

## 2023-03-09 NOTE — Transitions of Care (Post Inpatient/ED Visit) (Signed)
   03/09/2023  Name: Mandy Deleon MRN: [id number] DOB: Nov 11, 1965  Today's TOC FU Call Status: Today's TOC FU Call Status:: Unsuccessful Call (1st Attempt) Unsuccessful Call (1st Attempt) Date: 03/09/23  Attempted to reach the patient regarding the most recent Inpatient/ED visit.  Follow Up Plan: Additional outreach attempts will be made to reach the patient to complete the Transitions of Care (Post Inpatient/ED visit) call.   Signature Karena Addison, LPN [facility] Nurse Health Advisor Direct Dial [phone]

## 2023-03-09 NOTE — Telephone Encounter (Signed)
  Chief Complaint: Vomiting Symptoms: Vomited x 3-4 today. States has been vomiting since had 2 seizures Tuesday, dizziness since then as well. States temp 99.0 Frequency: Tuesday Pertinent Negatives: Patient denies  Disposition: [] ED /[] Urgent Care (no appt availability in office) / [x] Appointment(In office/virtual)/ []  [facility]/ [] Home Care/ [] Refused Recommended Disposition /[] [facility]/ []  Follow-up with PCP Additional Notes:  Pt evasive historian. States 2 seizures Tuesday, admitted, had fallen, hit head. Reports called neurologist , "Bumped my medications back up." Has Zofran for vomiting, "Helps." Verified pt states she had vomiting and dizziness while in hospital. Head CTs , cervical, done x 2. Advised ED, declined, "I was this way when I left." Husband states the neurologist "Thinks the med he bumped back up will take care of it." Secured appt for AM. Advised ED for ANY worsening symptoms, reviewed S/S dehydration. ED  for lethargy, sleepiness, severe headache, seizures, confusion. DO you advise ED now? Reason for Disposition  [1] MILD or MODERATE vomiting AND [2] present > 48 hours (2 days) (Exception: Mild vomiting with associated diarrhea.)  Answer Assessment - Initial Assessment Questions 1. VOMITING SEVERITY: "How many times have you vomited in the past 24 hours?"     - MILD:  1 - 2 times/day    - MODERATE: 3 - 5 times/day, decreased oral intake without significant weight loss or symptoms of dehydration    - SEVERE: 6 or more times/day, vomits everything or nearly everything, with significant weight loss, symptoms of dehydration      3-4 2. ONSET: "When did the vomiting begin?"      Tuesday after seizure activity 3. FLUIDS: "What fluids or food have you vomited up today?" "Have you been able to keep any fluids down?"     "Drinking water" 4. ABDOMEN PAIN: "Are your having any abdomen pain?" If Yes : "How bad is it and what does it feel like?" (e.g.,  crampy, dull, intermittent, constant)      no 5. DIARRHEA: "Is there any diarrhea?" If Yes, ask: "How many times today?"      no 6. CONTACTS: "Is there anyone else in the family with the same symptoms?"      No 7. CAUSE: "What do you think is causing your vomiting?"     Unsure 8. HYDRATION STATUS: "Any signs of dehydration?" (e.g., dry mouth [not only dry lips], too weak to stand) "When did you last urinate?"      9. OTHER SYMPTOMS: "Do you have any other symptoms?" (e.g., fever, headache, vertigo, vomiting blood or coffee grounds, recent head injury)     Dizziness, vomiting  Protocols used: Vomiting-A-AH

## 2023-03-09 NOTE — Telephone Encounter (Signed)
FYI for appt tomorrow

## 2023-03-10 ENCOUNTER — Ambulatory Visit (INDEPENDENT_AMBULATORY_CARE_PROVIDER_SITE_OTHER): Payer: Medicare Other | Admitting: Physician Assistant

## 2023-03-10 VITALS — BP 95/66 | HR 88

## 2023-03-10 DIAGNOSIS — R112 Nausea with vomiting, unspecified: Secondary | ICD-10-CM | POA: Diagnosis not present

## 2023-03-10 DIAGNOSIS — W19XXXD Unspecified fall, subsequent encounter: Secondary | ICD-10-CM

## 2023-03-10 DIAGNOSIS — K219 Gastro-esophageal reflux disease without esophagitis: Secondary | ICD-10-CM

## 2023-03-10 DIAGNOSIS — Z09 Encounter for follow-up examination after completed treatment for conditions other than malignant neoplasm: Secondary | ICD-10-CM

## 2023-03-10 DIAGNOSIS — G40909 Epilepsy, unspecified, not intractable, without status epilepticus: Secondary | ICD-10-CM

## 2023-03-10 DIAGNOSIS — S0101XD Laceration without foreign body of scalp, subsequent encounter: Secondary | ICD-10-CM

## 2023-03-10 LAB — URINE CULTURE: Culture: >=100000 — AB

## 2023-03-10 NOTE — Progress Notes (Unsigned)
Established patient visit  Patient: Mandy Deleon   DOB: September 25, 1965   57 y.o. Female  MRN: [id number] Visit Date: 03/10/2023  Today's healthcare provider: Debera Lat, PA-C   Chief Complaint  Patient presents with   Medical Management of Chronic Issues    Dizziness, vomiting, and seizures, has had 2 since Tuesday, hit head during 1st seizure and 2nd was Tuesday afternoon while taking dog out,    Subjective     Discussed the use of AI scribe software for clinical note transcription with the patient, who gave verbal consent to proceed.  History of Present Illness   The patient, with a known history of epilepsy, presents with recent falls due to seizures. She reports two recent episodes where she fell and hit her head, one of which resulted in a head injury that required staples. The patient does not recall the events surrounding the falls but was informed by a neighbor and her partner about the incidents. The patient's partner witnessed a seizure in the shower, during which the patient fell and hit her head on the sink. The patient has been experiencing post-fall symptoms including stiffness, difficulty moving around, and a persistent headache. She also reports indigestion and loose bowel movements. The patient's partner has been assisting with mobility and care. The patient is currently on multiple anti-seizure medications including Keppra, Xcopri, and Zonegran.           09/23/2022   10:43 AM 07/11/2022    4:26 PM 03/17/2022    2:14 PM  Depression screen PHQ 2/9  Decreased Interest 0 0 0  Down, Depressed, Hopeless 0 0 0  PHQ - 2 Score 0 0 0  Altered sleeping 0 0 0  Tired, decreased energy 0 0 0  Change in appetite 0 0 0  Feeling bad or failure about yourself  0 0 0  Trouble concentrating 0 0 0  Moving slowly or fidgety/restless 0 1 0  Suicidal thoughts 0 0 0  PHQ-9 Score 0 1 0  Difficult doing work/chores Not difficult at all Not difficult at all Not difficult at all        No data to display          Medications: Outpatient Medications Prior to Visit  Medication Sig   acetaminophen (TYLENOL) 500 MG tablet Take 1,000 mg by mouth every 6 (six) hours as needed (pain.).    Cenobamate (XCOPRI) 100 MG TABS Take by mouth at bedtime. 100mg  every morning, 200mg  qhs, except 300mg  qhs on Wed   cephALEXin (KEFLEX) 500 MG capsule Take 1 capsule (500 mg total) by mouth 2 (two) times daily for 7 days.   cetirizine (ZYRTEC) 10 MG tablet TAKE ONE TABLET BY MOUTH ONCE DAILY   cholecalciferol (VITAMIN D3) 25 MCG (1000 UNIT) tablet Take 1,000 Units by mouth daily.   Fish Oil-Cholecalciferol (OMEGA-3 + D PO) Take 1 capsule by mouth daily in the afternoon.   fluticasone (FLONASE) 50 MCG/ACT nasal spray Place 2 sprays into both nostrils daily.   levETIRAcetam (KEPPRA) 750 MG tablet Take 750 mg by mouth 2 (two) times daily.   Melatonin 5 MG CAPS Take 5 mg by mouth at bedtime.    Methsuximide (CELONTIN) 300 MG CAPS Take 300 mg by mouth once a week. Friday at night   ondansetron (ZOFRAN) 4 MG tablet TAKE ONE TABLET BY MOUTH EVERY EIGHT HOURS AS NEEDED FOR NAUSEA OR VOMITING   oxybutynin (DITROPAN-XL) 10 MG 24 hr tablet TAKE ONE TABLET BY MOUTH EVERY NIGHT  AT BEDTIME   Phenylephrine-DM-GG-APAP (DELSYM COUGH/COLD DAYTIME PO) Take by mouth.   zonisamide (ZONEGRAN) 100 MG capsule Take 1 capsule (100 mg total) by mouth in the morning, at noon, and at bedtime.   No facility-administered medications prior to visit.    Review of Systems  All other systems reviewed and are negative.  Except see HPI   {Insert previous labs (optional):23779} {See past labs  Heme  Chem  Endocrine  Serology  Results Review (optional):1}   Objective    BP 95/66 (BP Location: Left Arm, Patient Position: Sitting, Cuff Size: Large)   Pulse 88   SpO2 100%  {Insert last BP/Wt (optional):23777}{See vitals history (optional):1}   Physical Exam Vitals reviewed.  Constitutional:      General: She is  not in acute distress.    Appearance: Normal appearance. She is well-developed. She is not diaphoretic.  HENT:     Head: Normocephalic and atraumatic.  Eyes:     General: No scleral icterus.    Conjunctiva/sclera: Conjunctivae normal.  Neck:     Thyroid: No thyromegaly.  Cardiovascular:     Rate and Rhythm: Normal rate and regular rhythm.     Pulses: Normal pulses.     Heart sounds: Normal heart sounds. No murmur heard. Pulmonary:     Effort: Pulmonary effort is normal. No respiratory distress.     Breath sounds: Normal breath sounds. No wheezing, rhonchi or rales.  Musculoskeletal:     Cervical back: Neck supple.     Right lower leg: No edema.     Left lower leg: No edema.  Lymphadenopathy:     Cervical: No cervical adenopathy.  Skin:    General: Skin is warm and dry.     Findings: No rash.  Neurological:     Mental Status: She is alert and oriented to person, place, and time. Mental status is at baseline.  Psychiatric:        Mood and Affect: Mood normal.        Behavior: Behavior normal.      Results for orders placed or performed in visit on 03/10/23  CBC with Differential/Platelet  Result Value Ref Range   WBC 7.0 3.4 - 10.8 x10E3/uL   RBC 3.95 3.77 - 5.28 x10E6/uL   Hemoglobin 12.6 11.1 - 15.9 g/dL   Hematocrit 69.6 29.5 - 46.6 %   MCV 98 (H) 79 - 97 fL   MCH 31.9 26.6 - 33.0 pg   MCHC 32.7 31.5 - 35.7 g/dL   RDW 28.4 13.2 - 44.0 %   Platelets 223 150 - 450 x10E3/uL   Neutrophils 53 Not Estab. %   Lymphs 34 Not Estab. %   Monocytes 10 Not Estab. %   Eos 2 Not Estab. %   Basos 1 Not Estab. %   Neutrophils Absolute 3.8 1.4 - 7.0 x10E3/uL   Lymphocytes Absolute 2.4 0.7 - 3.1 x10E3/uL   Monocytes Absolute 0.7 0.1 - 0.9 x10E3/uL   EOS (ABSOLUTE) 0.1 0.0 - 0.4 x10E3/uL   Basophils Absolute 0.0 0.0 - 0.2 x10E3/uL   Immature Granulocytes 0 Not Estab. %   Immature Grans (Abs) 0.0 0.0 - 0.1 x10E3/uL  Comprehensive metabolic panel  Result Value Ref Range    Glucose 96 70 - 99 mg/dL   BUN 12 6 - 24 mg/dL   Creatinine, Ser 1.02 0.57 - 1.00 mg/dL   eGFR 70 >72 ZD/GUY/4.03   BUN/Creatinine Ratio 13 9 - 23   Sodium 143 134 - 144 mmol/L  Potassium 4.7 3.5 - 5.2 mmol/L   Chloride 109 (H) 96 - 106 mmol/L   CO2 18 (L) 20 - 29 mmol/L   Calcium 9.4 8.7 - 10.2 mg/dL   Total Protein 6.4 6.0 - 8.5 g/dL   Albumin 4.2 3.8 - 4.9 g/dL   Globulin, Total 2.2 1.5 - 4.5 g/dL   Bilirubin Total 0.2 0.0 - 1.2 mg/dL   Alkaline Phosphatase 159 (H) 44 - 121 IU/L   AST 28 0 - 40 IU/L   ALT 30 0 - 32 IU/L    Assessment & Plan       Nausea and vomiting, unspecified vomiting type Fall, subsequent encounter  Head Trauma Recent head trauma due to falls during seizures. Staples placed in the scalp for wound closure. -Clean wound daily with antibacterial soap and apply triple antibiotic ointment. -Staples to be removed on 03/21/2023. - CBC with Differential/Platelet - Comprehensive metabolic panel  Laceration of scalp without foreign body, subsequent encounter *** - CBC with Differential/Platelet - Comprehensive metabolic panel   Seizure disorder (HCC) Recent increase in seizure frequency with associated falls and head trauma. Neurologist has adjusted medication regimen. Patient is currently on Keppra, Xcopri 200mg , and Zonegran 100mg . -Continue current medications as prescribed by neurologist. -Ensure safe environment to prevent injury during seizures. -Consider use of shower seat for safety. -Follow-up with neurologist in January.  Gastroesophageal reflux disease without esophagitis Complaints of indigestion. Currently self-treating with mustard packets. -Discontinue use of mustard packets for indigestion. -Recommend dietary modifications including avoidance of spicy, salty foods, and caffeine. -Encourage consumption of more fluid foods like soup and Ensure. -Use over-the-counter Tums for symptomatic relief.  General Health Maintenance -Repeat labs  today to monitor for any abnormalities. -Consider EKG to rule out cardiac issues related to recent falls and seizures. -Continue hydration with water or electrolyte-containing fluids like diluted Gatorade or Pedialyte.      No follow-ups on file.     The patient was advised to call back or seek an in-person evaluation if the symptoms worsen or if the condition fails to improve as anticipated.  I discussed the assessment and treatment plan with the patient. The patient was provided an opportunity to ask questions and all were answered. The patient agreed with the plan and demonstrated an understanding of the instructions.  I, Debera Lat, PA-C have reviewed all documentation for this visit. The documentation on  03/10/23 for the exam, diagnosis, procedures, and orders are all accurate and complete.  Debera Lat, [facility], MMS [facility] [phone] (phone) [phone] (fax)  [facility]

## 2023-03-11 LAB — COMPREHENSIVE METABOLIC PANEL
ALT: 30 [IU]/L (ref 0–32)
AST: 28 [IU]/L (ref 0–40)
Albumin: 4.2 g/dL (ref 3.8–4.9)
Alkaline Phosphatase: 159 [IU]/L — ABNORMAL HIGH (ref 44–121)
BUN/Creatinine Ratio: 13 (ref 9–23)
BUN: 12 mg/dL (ref 6–24)
Bilirubin Total: 0.2 mg/dL (ref 0.0–1.2)
CO2: 18 mmol/L — ABNORMAL LOW (ref 20–29)
Calcium: 9.4 mg/dL (ref 8.7–10.2)
Chloride: 109 mmol/L — ABNORMAL HIGH (ref 96–106)
Creatinine, Ser: 0.94 mg/dL (ref 0.57–1.00)
Globulin, Total: 2.2 g/dL (ref 1.5–4.5)
Glucose: 96 mg/dL (ref 70–99)
Potassium: 4.7 mmol/L (ref 3.5–5.2)
Sodium: 143 mmol/L (ref 134–144)
Total Protein: 6.4 g/dL (ref 6.0–8.5)
eGFR: 70 mL/min/{1.73_m2} (ref >59)

## 2023-03-11 LAB — CBC WITH DIFFERENTIAL/PLATELET
Basophils Absolute: 0 10*3/uL (ref 0.0–0.2)
Basos: 1 %
EOS (ABSOLUTE): 0.1 10*3/uL (ref 0.0–0.4)
Eos: 2 %
Hematocrit: 38.5 % (ref 34.0–46.6)
Hemoglobin: 12.6 g/dL (ref 11.1–15.9)
Immature Grans (Abs): 0 10*3/uL (ref 0.0–0.1)
Immature Granulocytes: 0 %
Lymphocytes Absolute: 2.4 10*3/uL (ref 0.7–3.1)
Lymphs: 34 %
MCH: 31.9 pg (ref 26.6–33.0)
MCHC: 32.7 g/dL (ref 31.5–35.7)
MCV: 98 fL — ABNORMAL HIGH (ref 79–97)
Monocytes Absolute: 0.7 10*3/uL (ref 0.1–0.9)
Monocytes: 10 %
Neutrophils Absolute: 3.8 10*3/uL (ref 1.4–7.0)
Neutrophils: 53 %
Platelets: 223 10*3/uL (ref 150–450)
RBC: 3.95 x10E6/uL (ref 3.77–5.28)
RDW: 12.3 % (ref 11.7–15.4)
WBC: 7 10*3/uL (ref 3.4–10.8)

## 2023-03-12 ENCOUNTER — Emergency Department: Payer: Medicare Other

## 2023-03-12 ENCOUNTER — Other Ambulatory Visit: Payer: Self-pay

## 2023-03-12 ENCOUNTER — Emergency Department
Admission: EM | Admit: 2023-03-12 | Discharge: 2023-03-12 | Disposition: A | Discharge status: Home / Self Care | Payer: Medicare Other | Attending: Emergency Medicine | Admitting: Emergency Medicine

## 2023-03-12 DIAGNOSIS — G40909 Epilepsy, unspecified, not intractable, without status epilepticus: Secondary | ICD-10-CM | POA: Insufficient documentation

## 2023-03-12 DIAGNOSIS — R112 Nausea with vomiting, unspecified: Secondary | ICD-10-CM | POA: Insufficient documentation

## 2023-03-12 DIAGNOSIS — R0789 Other chest pain: Secondary | ICD-10-CM | POA: Diagnosis not present

## 2023-03-12 DIAGNOSIS — W19XXXA Unspecified fall, initial encounter: Secondary | ICD-10-CM | POA: Insufficient documentation

## 2023-03-12 DIAGNOSIS — S0101XA Laceration without foreign body of scalp, initial encounter: Secondary | ICD-10-CM | POA: Insufficient documentation

## 2023-03-12 DIAGNOSIS — R569 Unspecified convulsions: Secondary | ICD-10-CM | POA: Diagnosis present

## 2023-03-12 LAB — CBC WITH DIFFERENTIAL/PLATELET
Abs Immature Granulocytes: 0.04 10*3/uL (ref 0.00–0.07)
Basophils Absolute: 0 10*3/uL (ref 0.0–0.1)
Basophils Relative: 0 %
Eosinophils Absolute: 0.2 10*3/uL (ref 0.0–0.5)
Eosinophils Relative: 2 %
HCT: 40.6 % (ref 36.0–46.0)
Hemoglobin: 12.9 g/dL (ref 12.0–15.0)
Immature Granulocytes: 0 %
Lymphocytes Relative: 14 %
Lymphs Abs: 1.3 10*3/uL (ref 0.7–4.0)
MCH: 31.7 pg (ref 26.0–34.0)
MCHC: 31.8 g/dL (ref 30.0–36.0)
MCV: 99.8 fL (ref 80.0–100.0)
Monocytes Absolute: 0.7 10*3/uL (ref 0.1–1.0)
Monocytes Relative: 8 %
Neutro Abs: 6.9 10*3/uL (ref 1.7–7.7)
Neutrophils Relative %: 76 %
Platelets: 220 10*3/uL (ref 150–400)
RBC: 4.07 MIL/uL (ref 3.87–5.11)
RDW: 13.3 % (ref 11.5–15.5)
WBC: 9.1 10*3/uL (ref 4.0–10.5)
nRBC: 0 % (ref 0.0–0.2)

## 2023-03-12 LAB — COMPREHENSIVE METABOLIC PANEL
ALT: 31 U/L (ref 0–44)
AST: 28 U/L (ref 15–41)
Albumin: 3.6 g/dL (ref 3.5–5.0)
Alkaline Phosphatase: 125 U/L (ref 38–126)
Anion gap: 9 (ref 5–15)
BUN: 12 mg/dL (ref 6–20)
CO2: 19 mmol/L — ABNORMAL LOW (ref 22–32)
Calcium: 8.8 mg/dL — ABNORMAL LOW (ref 8.9–10.3)
Chloride: 109 mmol/L (ref 98–111)
Creatinine, Ser: 0.89 mg/dL (ref 0.44–1.00)
GFR, Estimated: >60 mL/min (ref >60)
Glucose, Bld: 126 mg/dL — ABNORMAL HIGH (ref 70–99)
Potassium: 4.2 mmol/L (ref 3.5–5.1)
Sodium: 137 mmol/L (ref 135–145)
Total Bilirubin: 0.8 mg/dL (ref 0.3–1.2)
Total Protein: 6.7 g/dL (ref 6.5–8.1)

## 2023-03-12 LAB — URINALYSIS, ROUTINE W REFLEX MICROSCOPIC
Bilirubin Urine: NEGATIVE
Glucose, UA: NEGATIVE mg/dL
Hgb urine dipstick: NEGATIVE
Ketones, ur: 20 mg/dL — AB
Leukocytes,Ua: NEGATIVE
Nitrite: NEGATIVE
Protein, ur: NEGATIVE mg/dL
Specific Gravity, Urine: 1.032 — ABNORMAL HIGH (ref 1.005–1.030)
pH: 8 (ref 5.0–8.0)

## 2023-03-12 LAB — TROPONIN I (HIGH SENSITIVITY)
Troponin I (High Sensitivity): <2 ng/L (ref <18)
Troponin I (High Sensitivity): 5 ng/L (ref <18)

## 2023-03-12 LAB — LIPASE, BLOOD: Lipase: 31 U/L (ref 11–51)

## 2023-03-12 MED ORDER — ONDANSETRON HCL 4 MG/2ML IJ SOLN: 4.0000 mg | Freq: Once | INTRAMUSCULAR | Status: DC

## 2023-03-12 MED ORDER — ACETAMINOPHEN 500 MG PO TABS
1000.0000 mg | ORAL_TABLET | Freq: Once | ORAL | Status: AC
  Administered 2023-03-12: 1000 mg via ORAL
  Filled 2023-03-12: qty 2

## 2023-03-12 MED ORDER — IOHEXOL 300 MG/ML  SOLN
100.0000 mL | Freq: Once | INTRAMUSCULAR | Status: AC | PRN
  Administered 2023-03-12: 100 mL via INTRAVENOUS

## 2023-03-12 MED ORDER — LEVETIRACETAM IN NACL 1000 MG/100ML IV SOLN
1000.0000 mg | Freq: Once | INTRAVENOUS | Status: AC
  Administered 2023-03-12: 1000 mg via INTRAVENOUS
  Filled 2023-03-12: qty 100

## 2023-03-12 MED ORDER — ONDANSETRON 4 MG PO TBDP
4.0000 mg | ORAL_TABLET | Freq: Once | ORAL | Status: AC
  Administered 2023-03-12: 4 mg via ORAL
  Filled 2023-03-12: qty 1

## 2023-03-12 NOTE — ED Notes (Signed)
Pt assisted to toilet in room by this EDT.

## 2023-03-12 NOTE — ED Notes (Signed)
Pt taken to CT.

## 2023-03-12 NOTE — ED Triage Notes (Signed)
Pt arrives from home via [facility].  C/O seizures with HX. Seizure tonight approx 30 secs resolved by use of Vagus Nerve Stimulator.  States N/V x 5 days w/no relief from Zofran. Pt A&Ox4, NAD.

## 2023-03-12 NOTE — Discharge Instructions (Addendum)
Continue your Zofran as needed for nausea and vomiting.  Please continue your seizure medications as prescribed.  Please follow-up with your PCP and neurologist.

## 2023-03-12 NOTE — ED Provider Notes (Signed)
[facility] Provider Note    Event Date/Time   First MD Initiated Contact with Patient 03/12/23 [phone]     (approximate)   History   Seizures   HPI  Mandy Deleon is a 57 y.o. 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  Patient's husband reports she had a generalized tonic-clonic seizure that lasted about 30 seconds tonight.  She has a vagal nerve stimulator.  He was able to stop her seizure using a magnet over her stimulator.  She was postictal afterwards.  States that she has not been able to keep down her medications because she has had nausea and vomiting for the past 4 to 5 days.  She has Zofran at home.  No diarrhea.  Reports chest soreness from vomiting.  Also having abdominal soreness.  No dysuria, hematuria, vaginal bleeding or discharge.  She has had previous cholecystectomy.  Last seizure was 4 days ago.   History provided by patient, husband, EMS.    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   Frequent falls    d/t seizures   Gait instability    specifically w/ seizure   History of viral encephalitis 1980   causing current seizure disorder   Seizures (HCC)    Ulcerative colitis (HCC)     Past Surgical History:  Procedure Laterality Date   APPENDECTOMY     BREAST BIOPSY Right 10/28/2022   stereo bx, calcs, "RIBBON" clip-path pending   BREAST BIOPSY Right 10/28/2022   MM RT BREAST BX W LOC DEV 1ST LESION IMAGE BX SPEC STEREO GUIDE 10/28/2022 [facility]   CATARACT EXTRACTION W/PHACO Left 09/29/2020   Procedure: CATARACT EXTRACTION PHACO AND INTRAOCULAR LENS PLACEMENT (IOC) LEFT  11.62 01:05.0;  Surgeon: Galen Manila, MD;  Location: [facility];  Service: Ophthalmology;  Laterality: Left;   CATARACT EXTRACTION W/PHACO Right 10/13/2020   Procedure: CATARACT EXTRACTION PHACO AND INTRAOCULAR LENS PLACEMENT (IOC) RIGHT;  Surgeon: Galen Manila, MD;  Location: [facility];  Service: Ophthalmology;  Laterality: Right;  CDE 4.17 00:23.0 minutes   CHOLECYSTECTOMY     CRANIECTOMY / CRANIOTOMY FOR IMPLANTATION NEUROSTIMULATOR ELECTRODES  2011   EYE SURGERY  10/2019   HYSTEROSCOPY WITH D & C Bilateral 11/08/2019   Procedure: DILATATION AND CURETTAGE /HYSTEROSCOPY;  Surgeon: Linzie Collin, MD;  Location: [facility];  Service: Gynecology;  Laterality: Bilateral;   PARS PLANA VITRECTOMY Left 2021   RNS     responsive neurostimulation for seizures. (brain electrodes)   TONSILLECTOMY     vagas nerve stimulator  2011    MEDICATIONS:  Prior to Admission medications   Medication Sig Start Date End Date Taking? Authorizing Provider  acetaminophen (TYLENOL) 500 MG tablet Take 1,000 mg by mouth every 6 (six) hours as needed (pain.).     [provider]  Cenobamate (XCOPRI) 100 MG TABS Take by mouth at bedtime. 100mg  every morning, 200mg  qhs, except 300mg  qhs on Wed    [provider]  cephALEXin (KEFLEX) 500 MG capsule Take 1 capsule (500 mg total) by mouth 2 (two) times daily for 7 days. 03/07/23 03/14/23  Fuller Plan,  Alben Spittle, MD  cetirizine (ZYRTEC) 10 MG tablet TAKE ONE TABLET BY MOUTH ONCE DAILY 08/16/22   Erasmo Downer, MD  cholecalciferol (VITAMIN D3) 25 MCG (1000 UNIT) tablet Take 1,000 Units by mouth daily.    [provider]  Fish Oil-Cholecalciferol (OMEGA-3 + D PO) Take 1 capsule by mouth daily in the afternoon.    [provider]  fluticasone (FLONASE) 50 MCG/ACT nasal spray Place 2 sprays into both nostrils daily. 09/23/22   Erasmo Downer, MD  levETIRAcetam (KEPPRA) 750 MG tablet Take 750 mg by mouth 2 (two)  times daily.    [provider]  Melatonin 5 MG CAPS Take 5 mg by mouth at bedtime.     [provider]  Methsuximide (CELONTIN) 300 MG CAPS Take 300 mg by mouth once a week. Friday at night    [provider]  ondansetron (ZOFRAN) 4 MG tablet TAKE ONE TABLET BY MOUTH EVERY EIGHT HOURS AS NEEDED FOR NAUSEA OR VOMITING 11/08/22   Bacigalupo, Marzella Schlein, MD  oxybutynin (DITROPAN-XL) 10 MG 24 hr tablet TAKE ONE TABLET BY MOUTH EVERY NIGHT AT BEDTIME 01/16/23   Bacigalupo, Marzella Schlein, MD  Phenylephrine-DM-GG-APAP (DELSYM COUGH/COLD DAYTIME PO) Take by mouth.    [provider]  zonisamide (ZONEGRAN) 100 MG capsule Take 1 capsule (100 mg total) by mouth in the morning, at noon, and at bedtime. 03/08/23 07/19/26  Sunnie Nielsen, DO    Physical Exam   Triage Vital Signs: ED Triage Vitals  Encounter Vitals Group     BP 03/12/23 0242 120/84     Systolic BP Percentile --      Diastolic BP Percentile --      Pulse Rate 03/12/23 0242 76     Resp 03/12/23 0242 16     Temp 03/12/23 0242 (!) 97.5 F (36.4 C)     Temp Source 03/12/23 0242 Oral     SpO2 03/12/23 0241 99 %     Weight 03/12/23 0244 186 lb (84.4 kg)     Height 03/12/23 0244 5\' 3"  (1.6 m)     Head Circumference --      Peak Flow --      Pain Score 03/12/23 0242 10     Pain Loc --      Pain Education --      Exclude from Growth Chart --     Most recent vital signs: Vitals:   03/12/23 0248 03/12/23 0530  BP: 120/84 124/81  Pulse: 85 93  Resp: 12 14  Temp: (!) 97.5 F (36.4 C)   SpO2: 98% 97%    CONSTITUTIONAL: Alert, responds appropriately to questions. Well-appearing; well-nourished HEAD: Normocephalic, atraumatic EYES: Conjunctivae clear, pupils appear equal, sclera nonicteric ENT: normal nose; moist mucous membranes NECK: Supple, normal ROM CARD: RRR; S1 and S2 appreciated RESP: Normal chest excursion without splinting or tachypnea; breath sounds clear and equal bilaterally; no wheezes, no  rhonchi, no rales, no hypoxia or respiratory distress, speaking full sentences ABD/GI: Non-distended; soft, non-tender, no rebound, no guarding, no peritoneal signs BACK: The back appears normal EXT: Normal ROM in all joints; no deformity noted, no edema SKIN: Normal color for age and race; warm; no rash on exposed skin NEURO: Moves all extremities equally, normal speech, normal sensation, no facial asymmetry PSYCH: The patient's mood and manner are appropriate.   ED Results / Procedures / Treatments   LABS: (all labs ordered are listed, but only abnormal results are displayed) Labs Reviewed  COMPREHENSIVE METABOLIC  PANEL - Abnormal; Notable for the following components:      Result Value   CO2 19 (*)    Glucose, Bld 126 (*)    Calcium 8.8 (*)    All other components within normal limits  URINALYSIS, ROUTINE W REFLEX MICROSCOPIC - Abnormal; Notable for the following components:   Color, Urine YELLOW (*)    APPearance CLOUDY (*)    Specific Gravity, Urine 1.032 (*)    Ketones, ur 20 (*)    All other components within normal limits  CBC WITH DIFFERENTIAL/PLATELET  LIPASE, BLOOD  TROPONIN I (HIGH SENSITIVITY)  TROPONIN I (HIGH SENSITIVITY)     EKG:    Date: 03/12/2023 2:41 AM  Rate: 80  Rhythm: normal sinus rhythm  QRS Axis: normal  Intervals: normal  ST/T Wave abnormalities: normal  Conduction Disutrbances: none  Narrative Interpretation: T wave flattening in anterior leads, no significant change compared to previous       RADIOLOGY: My personal review and interpretation of imaging: Chest x-ray clear.  CT of the abdomen pelvis shows no acute abnormality.  I have personally reviewed all radiology reports.   CT ABDOMEN PELVIS W CONTRAST  Result Date: 03/12/2023 CLINICAL DATA:  57 year old female with history of acute onset of nonlocalized abdominal pain. Seizures. EXAM: CT ABDOMEN AND PELVIS WITH CONTRAST TECHNIQUE: Multidetector CT imaging of the abdomen and  pelvis was performed using the standard protocol following bolus administration of intravenous contrast. RADIATION DOSE REDUCTION: This exam was performed according to the departmental dose-optimization program which includes automated exposure control, adjustment of the mA and/or kV according to patient size and/or use of iterative reconstruction technique. CONTRAST:  OMNIPAQUE IOHEXOL 300 MG/ML  SOLN COMPARISON:  No priors. FINDINGS: Lower chest: Small to moderate hiatal hernia. Prominent but nonenlarged distal paraesophageal lymph node measuring 7 mm in short axis incidentally noted and nonspecific (no imaging follow-up recommended). Hepatobiliary: No suspicious cystic or solid hepatic lesions. No intra or extrahepatic biliary ductal dilatation. Status post cholecystectomy. Pancreas: No pancreatic mass. No pancreatic ductal dilatation. No pancreatic or peripancreatic fluid collections or inflammatory changes. Spleen: Small splenule adjacent to the spleen. Otherwise, unremarkable. Adrenals/Urinary Tract: Numerous large parapelvic cysts associated with both kidneys (benign, no imaging follow-up recommended), measuring up to 4.3 cm on the left. No other suspicious appearing renal lesions. No hydroureteronephrosis. Bilateral adrenal glands are normal in appearance. Urinary bladder is unremarkable in appearance. Stomach/Bowel: The appearance of the stomach is normal. No pathologic dilatation of small bowel or colon. Numerous colonic diverticula are noted, without surrounding inflammatory changes to suggest an acute diverticulitis at this time. Status post appendectomy. Vascular/Lymphatic: No significant atherosclerotic disease, aneurysm or dissection noted in the abdominal or pelvic vasculature. No lymphadenopathy noted in the abdomen or pelvis. Reproductive: Uterus and ovaries are unremarkable in appearance. Other: No significant volume of ascites.  No pneumoperitoneum. Musculoskeletal: There are no aggressive  appearing lytic or blastic lesions noted in the visualized portions of the skeleton. IMPRESSION: 1. No acute findings are noted in the abdomen or pelvis to account for the patient's symptoms. 2. Colonic diverticulosis without evidence of acute diverticulitis at this time. 3. Additional incidental findings, as above. Electronically Signed   By: Trudie Reed M.D.   On: 03/12/2023 05:53   DG Chest Portable 1 View  Result Date: 03/12/2023 CLINICAL DATA:  Chest pain and seizure EXAM: PORTABLE CHEST 1 VIEW COMPARISON:  Five days ago FINDINGS: Normal heart size and mediastinal contours. No acute infiltrate or edema. No effusion or  pneumothorax. No acute osseous findings. Vagal nerve stimulator on the left in grossly stable position. Artifact from EKG leads. IMPRESSION: Low volume chest without acute finding. Electronically Signed   By: Tiburcio Pea M.D.   On: 03/12/2023 04:12     PROCEDURES:  Critical Care performed: No   CRITICAL CARE Performed by: Baxter Hire Keliyah Spillman   Total critical care time: 0 minutes  Critical care time was exclusive of separately billable procedures and treating other patients.  Critical care was necessary to treat or prevent imminent or life-threatening deterioration.  Critical care was time spent personally by me on the following activities: development of treatment plan with patient and/or surrogate as well as nursing, discussions with consultants, evaluation of patient's response to treatment, examination of patient, obtaining history from patient or surrogate, ordering and performing treatments and interventions, ordering and review of laboratory studies, ordering and review of radiographic studies, pulse oximetry and re-evaluation of patient's condition.   Marland Kitchen1-3 Lead EKG Interpretation  Performed by: Tanuj Mullens, Layla Maw, DO Authorized by: Katy Brickell, Layla Maw, DO     Interpretation: normal     ECG rate:  93   ECG rate assessment: normal     Rhythm: sinus rhythm      Ectopy: none     Conduction: normal       IMPRESSION / MDM / ASSESSMENT AND PLAN / ED COURSE  I reviewed the triage vital signs and the nursing notes.    Patient here after seizure.  Not able to keep down medications due to nausea and vomiting.  Having chest and abdominal pain.  The patient is on the cardiac monitor to evaluate for evidence of arrhythmia and/or significant heart rate changes.   DIFFERENTIAL DIAGNOSIS (includes but not limited to):   Viral gastroenteritis, gastritis, GERD, pancreatitis, appendicitis, colitis, bowel obstruction, UTI, dehydration, ACS, doubt PE or dissection   Patient's presentation is most consistent with acute presentation with potential threat to life or bodily function.   PLAN: Will obtain labs, urine, chest x-ray, CT abdomen pelvis.  Will give Zofran, IV Keppra.   MEDICATIONS GIVEN IN ED: Medications  ondansetron (ZOFRAN) injection 4 mg (0 mg Intravenous Hold 03/12/23 0530)  levETIRAcetam (KEPPRA) IVPB 1000 mg/100 mL premix (0 mg Intravenous Stopped 03/12/23 0545)  ondansetron (ZOFRAN-ODT) disintegrating tablet 4 mg (4 mg Oral Given 03/12/23 0345)  iohexol (OMNIPAQUE) 300 MG/ML solution 100 mL (100 mLs Intravenous Contrast Given 03/12/23 0445)  acetaminophen (TYLENOL) tablet 1,000 mg (1,000 mg Oral Given 03/12/23 0541)     ED COURSE: Patient's labs show no leukocytosis, normal hemoglobin, normal electrolytes, glucose.  Normal LFTs and lipase.  Troponin x 2 negative.  Urine shows no sign of infection or dehydration.  Chest x-ray and CT abdomen pelvis reviewed and interpreted by myself and the radiologist and shows no acute abnormality.  Patient is feeling better, tolerating p.o.  Vital signs unremarkable.  No further seizure-like activity.  She is at her neurologic baseline currently.  I feel she is safe to be discharged home.  She states she has Zofran for home.  Declines any other antiemetics at this time.  Recommended a bland diet for several days.   Recommended she take her seizure medication as prescribed and follow-up with her PCP and neurologist.  At this time, I do not feel there is any life-threatening condition present. I reviewed all nursing notes, vitals, pertinent previous records.  All lab and urine results, EKGs, imaging ordered have been independently reviewed and interpreted by myself.  I reviewed  all available radiology reports from any imaging ordered this visit.  Based on my assessment, I feel the patient is safe to be discharged home without further emergent workup and can continue workup as an outpatient as needed. Discussed all findings, treatment plan as well as usual and customary return precautions.  They verbalize understanding and are comfortable with this plan.  Outpatient follow-up has been provided as needed.  All questions have been answered.     CONSULTS:  none   OUTSIDE RECORDS REVIEWED: Reviewed recent admission on 03/07/2023.       FINAL CLINICAL IMPRESSION(S) / ED DIAGNOSES   Final diagnoses:  Seizure (HCC)  Nausea and vomiting in adult  Atypical chest pain     Rx / DC Orders   ED Discharge Orders     None        Note:  This document was prepared using Dragon voice recognition software and may include unintentional dictation errors.   Avital Dancy, Layla Maw, DO 03/12/23 0730

## 2023-03-13 NOTE — Progress Notes (Signed)
Lab results improved.

## 2023-03-14 NOTE — Transitions of Care (Post Inpatient/ED Visit) (Signed)
03/14/2023  Name: Mandy Deleon MRN: [id number] DOB: Jun 02, 1966  Today's TOC FU Call Status: Today's TOC FU Call Status:: Successful TOC FU Call Completed Unsuccessful Call (1st Attempt) Date: 03/09/23 [facility] FU Call Complete Date: 03/14/23 Patient's Name and Date of Birth confirmed.  Transition Care Management Follow-up Telephone Call Date of Discharge: 03/08/23 Discharge Facility: [facility]) Type of Discharge: Inpatient Admission Primary Inpatient Discharge Diagnosis:: syncope How have you been since you were released from the hospital?: Better Any questions or concerns?: No  Items Reviewed: Did you receive and understand the discharge instructions provided?: Yes Medications obtained,verified, and reconciled?: Yes (Medications Reviewed) Any new allergies since your discharge?: No Dietary orders reviewed?: Yes Do you have support at home?: Yes People in Home: significant other  Medications Reviewed Today: Medications Reviewed Today     Reviewed by Karena Addison, LPN (Licensed Practical Nurse) on 03/14/23 at 1518  Med List Status: <None>   Medication Order Taking? Sig Documenting Provider Last Dose Status Informant  acetaminophen (TYLENOL) 500 MG tablet [id number] No Take 1,000 mg by mouth every 6 (six) hours as needed (pain.).  [provider] Taking Active Self  Cenobamate (XCOPRI) 100 MG TABS [id number] No Take by mouth at bedtime. 100mg  every morning, 200mg  qhs, except 300mg  qhs on Wed [provider] Taking Active Self  cephALEXin (KEFLEX) 500 MG capsule [id number] No Take 1 capsule (500 mg total) by mouth 2 (two) times daily for 7 days. Concha Se, MD Taking Active            Med Note [facility], [place]   Wed Mar 08, 2023 10:24 AM) Unable to pick up PTA. Ordered fro UTI  cetirizine (ZYRTEC) 10 MG tablet [id number] No TAKE ONE TABLET BY MOUTH ONCE DAILY Erasmo Downer, MD Taking Active            Med Note  [facility], [place]   Wed Mar 08, 2023 10:25 AM) Leanora Ivanoff every night  cholecalciferol (VITAMIN D3) 25 MCG (1000 UNIT) tablet [id number] No Take 1,000 Units by mouth daily. [provider] Taking Active   Fish Oil-Cholecalciferol (OMEGA-3 + D PO) [id number] No Take 1 capsule by mouth daily in the afternoon. [provider] Taking Active   fluticasone (FLONASE) 50 MCG/ACT nasal spray [id number] No Place 2 sprays into both nostrils daily. Erasmo Downer, MD Taking Active   levETIRAcetam (KEPPRA) 750 MG tablet [id number] No Take 750 mg by mouth 2 (two) times daily. [provider] Taking Active   Melatonin 5 MG CAPS [id number] No Take 5 mg by mouth at bedtime.  [provider] Taking Active Self  Methsuximide (CELONTIN) 300 MG CAPS [id number] No Take 300 mg by mouth once a week. Friday at night [provider] Taking Active   ondansetron (ZOFRAN) 4 MG tablet [id number] No TAKE ONE TABLET BY MOUTH EVERY EIGHT HOURS AS NEEDED FOR NAUSEA OR VOMITING Bacigalupo, Marzella Schlein, MD Taking Active   oxybutynin (DITROPAN-XL) 10 MG 24 hr tablet [id number] No TAKE ONE TABLET BY MOUTH EVERY NIGHT AT BEDTIME Erasmo Downer, MD Taking Active   Phenylephrine-DM-GG-APAP (DELSYM COUGH/COLD DAYTIME PO) [id number] No Take by mouth. [provider] Taking Active   zonisamide (ZONEGRAN) 100 MG capsule [id number] No Take 1 capsule (100 mg total) by mouth in the morning, at noon, and at bedtime. Sunnie Nielsen, DO Taking Active             Home Care and Equipment/Supplies: Were Home Health Services Ordered?: NA  Any new equipment or medical supplies ordered?: NA  Functional Questionnaire: Do you need assistance with bathing/showering or dressing?: No Do you need assistance with meal preparation?: No Do you need assistance with eating?: No Do you have difficulty maintaining continence: No Do you need assistance with getting out of bed/getting out of a  chair/moving?: No Do you have difficulty managing or taking your medications?: No  Follow up appointments reviewed: PCP Follow-up appointment confirmed?: Yes Date of PCP follow-up appointment?: 03/21/23 Follow-up Provider[facility] Follow-up appointment confirmed?: NA Do you need transportation to your follow-up appointment?: No Do you understand care options if your condition(s) worsen?: Yes-patient verbalized understanding    SIGNATURE Karena Addison, LPN [facility] Nurse Health Advisor Direct Dial [phone]

## 2023-03-17 ENCOUNTER — Other Ambulatory Visit: Payer: Self-pay

## 2023-03-17 MED ORDER — SHINGRIX 50 MCG/0.5ML IM SUSR
0.5000 mL | Freq: Once | INTRAMUSCULAR | 0 refills | Status: DC
  Filled 2023-03-17: qty 0.5, 1d supply, fill #0

## 2023-03-20 ENCOUNTER — Other Ambulatory Visit: Payer: Self-pay | Admitting: Family Medicine

## 2023-03-21 ENCOUNTER — Ambulatory Visit: Payer: Medicare Other | Admitting: Family Medicine

## 2023-03-21 ENCOUNTER — Encounter: Payer: Self-pay | Admitting: Family Medicine

## 2023-03-21 VITALS — BP 113/87 | HR 119 | Temp 97.3°F | Ht 63.0 in

## 2023-03-21 DIAGNOSIS — R110 Nausea: Secondary | ICD-10-CM

## 2023-03-21 DIAGNOSIS — G40909 Epilepsy, unspecified, not intractable, without status epilepticus: Secondary | ICD-10-CM | POA: Diagnosis not present

## 2023-03-21 DIAGNOSIS — R197 Diarrhea, unspecified: Secondary | ICD-10-CM

## 2023-03-21 DIAGNOSIS — Z4802 Encounter for removal of sutures: Secondary | ICD-10-CM

## 2023-03-21 MED ORDER — ONDANSETRON HCL 4 MG PO TABS: 4.0000 mg | ORAL_TABLET | Freq: Three times a day (TID) | ORAL | 2 refills | Status: AC | PRN

## 2023-03-21 NOTE — Telephone Encounter (Signed)
Requested medication (s) are due for refill today: yes  Requested medication (s) are on the active medication list: yes  Last refill:  11/08/22  Future visit scheduled: yes  Notes to clinic:  Unable to refill per protocol, cannot delegate.      Requested Prescriptions  Pending Prescriptions Disp Refills   ondansetron (ZOFRAN) 4 MG tablet [Pharmacy Med Name: ONDANSETRON HYDROCHLORIDE 4MG  TABLET] 20 tablet 0    Sig: TAKE ONE TABLET BY MOUTH EVERY EIGHT HOURS AS NEEDED FOR NAUSEA OR VOMITING     Not Delegated - Gastroenterology: Antiemetics - ondansetron Failed - 03/20/2023  9:03 AM      Failed - This refill cannot be delegated      Passed - AST in normal range and within 360 days    AST  Date Value Ref Range Status  03/12/2023 28 15 - 41 U/L Final         Passed - ALT in normal range and within 360 days    ALT  Date Value Ref Range Status  03/12/2023 31 0 - 44 U/L Final         Passed - Valid encounter within last 6 months    Recent Outpatient Visits           1 week ago Nausea and vomiting, unspecified vomiting type   [place] [facility] [place], [place], PA-C   2 months ago Animator bite, accidental or unintentional, initial encounter   [facility], [place], PA-C   5 months ago Audiological scientist for annual wellness visit (AWV) in Medicare patient   [place] [facility] [place], Marzella Schlein, MD   8 months ago Worried well   [place] [facility] [place], Marzella Schlein, MD   1 year ago Gastroesophageal reflux disease without esophagitis   [place] [facility] Bacigalupo, Marzella Schlein, MD       Future Appointments             Today Bacigalupo, Marzella Schlein, MD [facility]

## 2023-03-21 NOTE — Progress Notes (Signed)
Established Patient Office Visit  Subjective   Patient ID: Mandy Deleon, female    DOB: 10-14-1965  Age: 57 y.o. MRN: [id number]  Chief Complaint  Patient presents with   Follow-up    Hospital Follow up and suture removal left side of head, pt states it hurts her to wash her hair and lay her head down.     HPI  Discussed the use of AI scribe software for clinical note transcription with the patient, who gave verbal consent to proceed.  History of Present Illness   The patient, with a history of seizures, presents two weeks after a head injury that required staples. She reports having multiple visits to the ER and hospital due to seizures. The most recent seizure episode occurred while taking her dog out, resulting in a fall and subsequent head injury. The patient's neighbor witnessed the event and called 911. The patient also reports having nausea and diarrhea since the first of the month, which coincides with an increase in her Celontin medication dosage from once a week to twice a week. The patient has been staying with her caretaker for safety and monitoring.         ROS    Objective:     BP 113/87   Pulse (!) 119   Temp (!) 97.3 F (36.3 C) (Oral)   Ht 5\' 3"  (1.6 m)   SpO2 98%   BMI 32.95 kg/m    Physical Exam  Physical Exam   SKIN: Staples removed from scalp, small amount of blood at site of removal, no active bleeding.        No results found for any visits on 03/21/23.    The 10-year ASCVD risk score (Arnett DK, et al., 2019) is: 1.9%    Assessment & Plan:   Problem List Items Addressed This Visit       Nervous and Auditory   Seizure disorder (HCC) - Primary   Other Visit Diagnoses     Encounter for removal of sutures       Nausea       Diarrhea, unspecified type               Seizure Disorder Recent increase in seizure frequency leading to head injury and hospitalization. Medication adjustment made by neurologist, increasing  Celontin from once to twice weekly. -Continue with neurologist's plan of increased Celontin dosing. Continue zonisamide and Keppra at current doses -Encouraged to maintain regular communication with neurologist.  Head Injury Staples from scalp laceration successfully removed today. Possible post-concussion syndrome with reported headaches. -Advise to minimize eye strain and avoid prolonged reading or screen time. -Encourage hydration and rest.  Gastrointestinal Upset New onset nausea and diarrhea since October 1st, possibly related to increased Celontin dose. -Prescribe Zofran for nausea. -Advise bland diet and hydration. -If symptoms persist or patient develops abdominal pain or fever, reassess.       Procedure: Staple Removal Description: Staples were removed from the scalp. One staple was firmly in place and required additional effort to remove. A tiny drop of blood was noted at the edge of one staple site, which stopped bleeding shortly after. Informed Consent: The patient was informed about the risks, benefits, and alternatives of the procedure. The process of crimping the staple in the middle to pull out the ends was explained. The patient was advised to remove her ponytail to avoid pulling hair during the procedure.   Return if symptoms worsen or fail to improve.  [name], MD

## 2023-03-23 ENCOUNTER — Telehealth: Payer: Self-pay

## 2023-03-28 ENCOUNTER — Ambulatory Visit: Payer: Self-pay | Admitting: *Deleted

## 2023-03-28 NOTE — Telephone Encounter (Signed)
  Chief Complaint: Vomiting after eating a grilled cheese sandwich.  Took some Zofran 30 min. Ago and not vomiting now. Symptoms: Has a headache and feels weak after vomiting. Frequency: Today after eating a sandwich. Pertinent Negatives: Patient denies diarrhea or fever. Disposition: [] ED /[] Urgent Care (no appt availability in office) / [] Appointment(In office/virtual)/ []  [facility]/ [x] Home Care/ [] Refused Recommended Disposition /[] [facility]/ []  Follow-up with PCP Additional Notes: Instructed her to call back if the Zofran and Pepto Bismol had not helped in a few hours or if after 5:00 PM to go to the urgent care.    She was agreeable to this plan.   Her next medication she normally takes is not due until 7:00 PM.   She has some Ginger Ale she's going sip on.

## 2023-03-28 NOTE — Telephone Encounter (Signed)
Reason for Disposition  MILD or MODERATE vomiting (e.g., 1 - 5 times / day)  Answer Assessment - Initial Assessment Questions 1. VOMITING SEVERITY: "How many times have you vomited in the past 24 hours?"     - MILD:  1 - 2 times/day    - MODERATE: 3 - 5 times/day, decreased oral intake without significant weight loss or symptoms of dehydration    - SEVERE: 6 or more times/day, vomits everything or nearly everything, with significant weight loss, symptoms of dehydration      I ate a grilled cheese sandwich at lunch.  Now I'm vomiting.   No fever.    I took a Zofran 30 min. Ago.   No diarrhea.    No other symptoms.   I feel very weak.   I have a headache so I'm going to take a Tylenol.     I saw Dr. Leonard Schwartz.   I had 3 seizures and hit my head and I got a concussion.  She remoed the Staples last week.  I'm having some  pain in throat and upper chest after vomiting. 2. ONSET: "When did the vomiting begin?"      After eating a grilled cheese sandwich a few minutes ago. 3. FLUIDS: "What fluids or food have you vomited up today?" "Have you been able to keep any fluids down?"     See above 4. ABDOMEN PAIN: "Are your having any abdomen pain?" If Yes : "How bad is it and what does it feel like?" (e.g., crampy, dull, intermittent, constant)      No  5. DIARRHEA: "Is there any diarrhea?" If Yes, ask: "How many times today?"      No 6. CONTACTS: "Is there anyone else in the family with the same symptoms?"      No 7. CAUSE: "What do you think is causing your vomiting?"     The grilled cheese sandwich 8. HYDRATION STATUS: "Any signs of dehydration?" (e.g., dry mouth [not only dry lips], too weak to stand) "When did you last urinate?"     No 9. OTHER SYMPTOMS: "Do you have any other symptoms?" (e.g., fever, headache, vertigo, vomiting blood or coffee grounds, recent head injury)     Burning in my throat and upper chest after vomiting.   I also have a headache. 10. PREGNANCY: "Is there any chance you are  pregnant?" "When was your last menstrual period?"       N/A due to age  Protocols used: Vomiting-A-AH

## 2023-03-31 ENCOUNTER — Other Ambulatory Visit: Payer: Self-pay

## 2023-03-31 ENCOUNTER — Emergency Department
Admission: EM | Admit: 2023-03-31 | Discharge: 2023-03-31 | Disposition: A | Discharge status: Home / Self Care | Payer: Medicare Other | Attending: Emergency Medicine | Admitting: Emergency Medicine

## 2023-03-31 ENCOUNTER — Encounter: Payer: Self-pay | Admitting: Emergency Medicine

## 2023-03-31 DIAGNOSIS — R569 Unspecified convulsions: Secondary | ICD-10-CM | POA: Insufficient documentation

## 2023-03-31 LAB — COMPREHENSIVE METABOLIC PANEL
ALT: 29 U/L (ref 0–44)
AST: 23 U/L (ref 15–41)
Albumin: 3.8 g/dL (ref 3.5–5.0)
Alkaline Phosphatase: 144 U/L — ABNORMAL HIGH (ref 38–126)
Anion gap: 11 (ref 5–15)
BUN: 20 mg/dL (ref 6–20)
CO2: 23 mmol/L (ref 22–32)
Calcium: 9.2 mg/dL (ref 8.9–10.3)
Chloride: 105 mmol/L (ref 98–111)
Creatinine, Ser: 1.07 mg/dL — ABNORMAL HIGH (ref 0.44–1.00)
GFR, Estimated: >60 mL/min (ref >60)
Glucose, Bld: 123 mg/dL — ABNORMAL HIGH (ref 70–99)
Potassium: 3.7 mmol/L (ref 3.5–5.1)
Sodium: 139 mmol/L (ref 135–145)
Total Bilirubin: 0.4 mg/dL (ref 0.3–1.2)
Total Protein: 7.6 g/dL (ref 6.5–8.1)

## 2023-03-31 LAB — CBC WITH DIFFERENTIAL/PLATELET
Abs Immature Granulocytes: 0.01 10*3/uL (ref 0.00–0.07)
Basophils Absolute: 0 10*3/uL (ref 0.0–0.1)
Basophils Relative: 1 %
Eosinophils Absolute: 0.3 10*3/uL (ref 0.0–0.5)
Eosinophils Relative: 6 %
HCT: 42.7 % (ref 36.0–46.0)
Hemoglobin: 13.7 g/dL (ref 12.0–15.0)
Immature Granulocytes: 0 %
Lymphocytes Relative: 28 %
Lymphs Abs: 1.5 10*3/uL (ref 0.7–4.0)
MCH: 31.9 pg (ref 26.0–34.0)
MCHC: 32.1 g/dL (ref 30.0–36.0)
MCV: 99.3 fL (ref 80.0–100.0)
Monocytes Absolute: 0.5 10*3/uL (ref 0.1–1.0)
Monocytes Relative: 10 %
Neutro Abs: 2.8 10*3/uL (ref 1.7–7.7)
Neutrophils Relative %: 55 %
Platelets: 399 10*3/uL (ref 150–400)
RBC: 4.3 MIL/uL (ref 3.87–5.11)
RDW: 14.4 % (ref 11.5–15.5)
WBC: 5.1 10*3/uL (ref 4.0–10.5)
nRBC: 0 % (ref 0.0–0.2)

## 2023-03-31 NOTE — ED Triage Notes (Signed)
Pt bib ems from home for witnessed seizure was seated throughout seizure, no injuries. Pt has hx of seizures 2 on 10/1 and 1 on 10/5. Seizure meds recently adjusted unsure of new med.  Pt a&o x4, complains of dizziness

## 2023-03-31 NOTE — Discharge Instructions (Signed)
You were seen in the ER today after a seizure-like episode. Please make sure to take your medication as directed. Let your neurologist know you were seen in the ER and arrange follow-up with them as soon as possible.  Please do not drive, swim or bathe unsupervised, or climb to heights for six months or until otherwise instructed by your doctor.  Return to the ER for new or worsening symptoms.

## 2023-03-31 NOTE — ED Provider Notes (Signed)
[facility] Provider Note    Event Date/Time   First MD Initiated Contact with Patient 03/31/23 1642     (approximate)   History   Seizures   HPI  Mandy Deleon is a 57 year old female with history of epilepsy on multiple AEDs as well as vagal nerve stimulator in place presenting to the emergency department for evaluation after a seizure.  Patient had a witnessed tonic-clonic seizure episode by her husband.  Episode lasted for about 10 minutes.  Reports this is similar to prior seizure episodes.  Patient does feel that she is back at baseline.  No head injury or other injuries noted.  Has had some ongoing issues with nausea that was thought to possibly be related to a concussion from recent head trauma.  She was seen in our ER on 03/12/2023 for the same and had a reassuring CT abdomen pelvis at that time.  Reports she did have a recent change in her AEDs to increase her Celontin from once a week to twice a week.  No fevers or chills.  No chest pain or shortness of breath.  Has Zofran for her nausea and has been tolerating p.o. intake with this.     Physical Exam   Triage Vital Signs: ED Triage Vitals  Encounter Vitals Group     BP 03/31/23 1654 103/62     Systolic BP Percentile --      Diastolic BP Percentile --      Pulse Rate 03/31/23 1654 81     Resp --      Temp 03/31/23 1654 97.6 F (36.4 C)     Temp Source 03/31/23 1654 Oral     SpO2 03/31/23 1654 100 %     Weight 03/31/23 1650 178 lb 4.8 oz (80.9 kg)     Height 03/31/23 1650 5\' 3"  (1.6 m)     Head Circumference --      Peak Flow --      Pain Score --      Pain Loc --      Pain Education --      Exclude from Growth Chart --     Most recent vital signs: Vitals:   03/31/23 1654  BP: 103/62  Pulse: 81  Temp: 97.6 F (36.4 C)  SpO2: 100%     General: Awake, interactive  CV:  Regular rate, good peripheral perfusion.  Resp:  Unlabored respirations.  Abd:  Soft, nontender,  nondistended Neuro:  Alert and oriented, normal extraocular movements, symmetric facial movement, sensation intact over bilateral upper and lower extremities with 5 out of 5 strength.  Normal finger-to-nose testing.  ED Results / Procedures / Treatments   Labs (all labs ordered are listed, but only abnormal results are displayed) Labs Reviewed  COMPREHENSIVE METABOLIC PANEL - Abnormal; Notable for the following components:      Result Value   Glucose, Bld 123 (*)    Creatinine, Ser 1.07 (*)    Alkaline Phosphatase 144 (*)    All other components within normal limits  CBC WITH DIFFERENTIAL/PLATELET  LEVETIRACETAM LEVEL     EKG EKG independently reviewed interpreted by myself (ER attending) demonstrates:    RADIOLOGY Imaging independently reviewed and interpreted by myself demonstrates:    PROCEDURES:  Critical Care performed: No  Procedures   MEDICATIONS ORDERED IN ED: Medications - No data to display   IMPRESSION / MDM / ASSESSMENT AND PLAN / ED COURSE  I reviewed the triage vital signs and  the nursing notes.  Differential diagnosis includes, but is not limited to, breakthrough seizure secondary to poor p.o. intake, electrolyte abnormality, recent AED change, medication nonadherence  Patient's presentation is most consistent with acute presentation with potential threat to life or bodily function.  57 year old female presenting following a seizure-like episode.  Vital signs stable on presentation.  Labs overall reassuring including sodium at 139.  Creatinine slightly elevated from baseline at 1.07.  Suspect patient may have mild component of dehydration given her nausea.  However, she is tolerating p.o. intake and does not appear significantly volume depleted on exam.  Keppra level sent for outpatient planning.  On reevaluation, patient remains at her baseline.  She is eager to be discharged home.  With her complex AED regimen, I will defer on any changes, but patient  reports she is in close contact with neurologist and will contact them regarding her breakthrough seizure.  Strict return precautions were provided.  Patient was discharged stable condition.      FINAL CLINICAL IMPRESSION(S) / ED DIAGNOSES   Final diagnoses:  Seizure-like activity (HCC)     Rx / DC Orders   ED Discharge Orders     None        Note:  This document was prepared using Dragon voice recognition software and may include unintentional dictation errors.   Trinna Post, MD 03/31/23 [facility]

## 2023-04-03 LAB — LEVETIRACETAM LEVEL: Levetiracetam Lvl: 25.3 ug/mL (ref 10.0–40.0)

## 2023-04-05 ENCOUNTER — Telehealth: Payer: Self-pay

## 2023-04-05 NOTE — Telephone Encounter (Signed)
Copied from CRM [id number]. Topic: General - Other >> Apr 05, 2023  2:21 PM Lennox Pippins wrote: Patient called and states she has had 5 seizures this month with alot of vomiting and diarrhea, she has been in and out of the hospital and she had an appointment scheduled in January with her Neurologist but they changed her appointment to next Wednesday,04/12/2023, Dr Craig Staggers in [place] at the Neurology office and patient wanted to let Dr B know this information. Offered patient a HFU appt and patient states she just saw Dr B a few weeks ago and she is aware of all of this.   Patients callback # right now : [phone]

## 2023-04-10 ENCOUNTER — Telehealth: Payer: Self-pay

## 2023-04-12 ENCOUNTER — Telehealth: Payer: Self-pay | Admitting: Family Medicine

## 2023-04-12 NOTE — Telephone Encounter (Signed)
Pt is calling in because she saw her neurologist and they requested she see speak with her PCP regarding getting a CT scan done. Please follow up with pt at [phone].

## 2023-04-14 NOTE — Telephone Encounter (Signed)
Looks like neuro ordered a CT scan themselves. They do not need me involved for this study

## 2023-04-17 NOTE — Telephone Encounter (Signed)
Patient has appt 04/18/23 at 10.

## 2023-04-18 ENCOUNTER — Encounter: Payer: Self-pay | Admitting: Family Medicine

## 2023-04-18 ENCOUNTER — Ambulatory Visit (INDEPENDENT_AMBULATORY_CARE_PROVIDER_SITE_OTHER): Payer: Medicare Other | Admitting: Family Medicine

## 2023-04-18 VITALS — BP 100/62 | HR 94 | Ht 63.0 in | Wt 183.1 lb

## 2023-04-18 DIAGNOSIS — G40909 Epilepsy, unspecified, not intractable, without status epilepticus: Secondary | ICD-10-CM | POA: Diagnosis not present

## 2023-04-18 DIAGNOSIS — L219 Seborrheic dermatitis, unspecified: Secondary | ICD-10-CM

## 2023-04-18 DIAGNOSIS — S0990XD Unspecified injury of head, subsequent encounter: Secondary | ICD-10-CM

## 2023-04-18 NOTE — Progress Notes (Signed)
   Established Patient Office Visit  Subjective   Patient ID: Mandy Deleon, female    DOB: 1965-10-04  Age: 57 y.o. MRN: [id number]  No chief complaint on file.   HPI  Discussed the use of AI scribe software for clinical note transcription with the patient, who gave verbal consent to proceed.  History of Present Illness   The patient, with a history of seizures and a recent head injury, presents for follow-up after a neurology visit. They report ongoing soreness and a scab on the scalp, which they believe may be related to the head injury. They deny receiving a call from the neurologist regarding a planned CT scan to assess for possible delayed bleeding post-injury. The patient is currently staying with her boyfriend for safety and support, given their recent health issues.         ROS    Objective:     BP 100/62 (BP Location: Left Arm, Patient Position: Sitting, Cuff Size: Large)   Pulse 94   Ht 5\' 3"  (1.6 m)   Wt 183 lb 1.6 oz (83.1 kg)   SpO2 98%   BMI 32.43 kg/m    Physical Exam  Physical Exam   HEENT: Scab on scalp. Seborrheic dermatitis on scalp.        No results found for any visits on 04/18/23.    The 10-year ASCVD risk score (Arnett DK, et al., 2019) is: 1.5%    Assessment & Plan:   Problem List Items Addressed This Visit       Nervous and Auditory   Seizure disorder (HCC)   Other Visit Diagnoses     Injury of head, subsequent encounter    -  Primary   Seborrheic dermatitis               Head Injury with Possible Delayed Intracranial Hemorrhage Ongoing symptoms post-head injury and seizure. Initial CT scan showed no bleeding, but concern for delayed intracranial hemorrhage remains. Follow-up CT scan ordered by neurologist; scheduling delayed due to communication issues. Outcome depends on CT scan results; significant bleeding may require surgical intervention, otherwise, reabsorption by the body is possible. - Provide phone number for  [facility] to schedule the CT scan - Ensure CT scan is scheduled promptly - Update neurologist on situation and confirm CT scan scheduling  Seborrheic Dermatitis Scalp scab identified as seborrheic dermatitis, characterized by dandruff and plaque buildup. - Recommend using Head & Shoulders shampoo weekly, ensuring thorough scrubbing of the scalp  Follow-up - Ensure attendance at follow-up appointments with the neurologist - Schedule next primary care visit for April 20th at 2:40 PM.        Return in about 5 months (around 09/16/2023) for as scheduled.    [name], MD

## 2023-05-22 ENCOUNTER — Other Ambulatory Visit: Payer: Self-pay | Admitting: Family Medicine

## 2023-07-10 ENCOUNTER — Other Ambulatory Visit: Payer: Self-pay | Admitting: Family Medicine

## 2023-09-25 ENCOUNTER — Ambulatory Visit (INDEPENDENT_AMBULATORY_CARE_PROVIDER_SITE_OTHER): Payer: Self-pay | Admitting: Family Medicine

## 2023-09-25 ENCOUNTER — Encounter: Payer: Self-pay | Admitting: Family Medicine

## 2023-09-25 VITALS — BP 91/62 | HR 84 | Temp 97.7°F | Resp 16 | Ht 63.0 in | Wt 189.9 lb

## 2023-09-25 DIAGNOSIS — R509 Fever, unspecified: Secondary | ICD-10-CM

## 2023-09-25 DIAGNOSIS — R739 Hyperglycemia, unspecified: Secondary | ICD-10-CM | POA: Diagnosis not present

## 2023-09-25 DIAGNOSIS — Z1231 Encounter for screening mammogram for malignant neoplasm of breast: Secondary | ICD-10-CM

## 2023-09-25 DIAGNOSIS — N3000 Acute cystitis without hematuria: Secondary | ICD-10-CM

## 2023-09-25 DIAGNOSIS — E559 Vitamin D deficiency, unspecified: Secondary | ICD-10-CM

## 2023-09-25 DIAGNOSIS — M7711 Lateral epicondylitis, right elbow: Secondary | ICD-10-CM

## 2023-09-25 DIAGNOSIS — Z79899 Other long term (current) drug therapy: Secondary | ICD-10-CM | POA: Diagnosis not present

## 2023-09-25 DIAGNOSIS — Z0000 Encounter for general adult medical examination without abnormal findings: Secondary | ICD-10-CM

## 2023-09-25 LAB — POCT URINALYSIS DIPSTICK
Bilirubin, UA: NEGATIVE
Blood, UA: NEGATIVE
Glucose, UA: NEGATIVE
Ketones, UA: NEGATIVE
Nitrite, UA: NEGATIVE
Protein, UA: POSITIVE — AB
Spec Grav, UA: 1.02 (ref 1.010–1.025)
Urobilinogen, UA: 0.2 U/dL
pH, UA: 7.5 (ref 5.0–8.0)

## 2023-09-25 MED ORDER — CEPHALEXIN 500 MG PO CAPS: 500.0000 mg | ORAL_CAPSULE | Freq: Three times a day (TID) | ORAL | 0 refills | Status: DC

## 2023-09-25 NOTE — Progress Notes (Signed)
 Annual Wellness Visit     Patient: Mandy Deleon, Female    DOB: December 02, 1965, 58 y.o.   MRN: [id number] Visit Date: 09/25/2023  Today's Provider: Aden Agreste, MD   Chief Complaint  Patient presents with   Medicare Wellness   Subjective    Mandy Deleon is a 58 y.o. female who presents today for her Annual Wellness Visit. She reports consuming a general diet.  She generally feels fairly well. She reports sleeping well. She does have additional problems to discuss today.   Discussed the use of AI scribe software for clinical note transcription with the patient, who gave verbal consent to proceed.  History of Present Illness   A 58 year old patient with a history of hypertension and epilepsy presents with recent episodes of seizures and vomiting. The patient reports two seizures and two episodes of vomiting on a Friday night, followed by another episode of vomiting the following night. The patient denies any preceding symptoms such as cold or belly symptoms before the onset of the fever on Friday. The patient's partner reports that during one of the seizures, the patient was disoriented and did not recognize her. The patient also reports feeling weak and tired after the episodes of vomiting.  In addition to the seizures and vomiting, the patient has been experiencing pain in her right arm for several months. The pain is exacerbated when she picks up objects. The patient denies any new rashes or other signs of infection. She also reports increased frequency of urination, which she attributes to increased water intake.  The patient has a family history of diabetes and expresses concern about the possibility of having the condition. She also mentions that she is due for a mammogram next month, following an abnormal result and biopsy the previous year.             Medications: Outpatient Medications Prior to Visit  Medication Sig   acetaminophen  (TYLENOL ) 500 MG tablet  Take 1,000 mg by mouth every 6 (six) hours as needed (pain.).    Cenobamate (XCOPRI) 100 MG TABS Take by mouth at bedtime. 100mg  every morning, 200mg  qhs, except 300mg  qhs on Wed   cetirizine (ZYRTEC) 10 MG tablet TAKE ONE TABLET BY MOUTH ONCE DAILY   cholecalciferol (VITAMIN D3) 25 MCG (1000 UNIT) tablet Take 1,000 Units by mouth daily.   Fish Oil-Cholecalciferol (OMEGA-3 + D PO) Take 1 capsule by mouth daily in the afternoon.   lansoprazole (PREVACID) 15 MG capsule Take 15 mg by mouth.   levETIRAcetam  (KEPPRA ) 750 MG tablet Take 750 mg by mouth 2 (two) times daily.   Melatonin 5 MG CAPS Take 5 mg by mouth at bedtime.    Methsuximide  (CELONTIN ) 300 MG CAPS Take 300 mg by mouth 2 (two) times a week.   ondansetron  (ZOFRAN ) 4 MG tablet Take 1 tablet (4 mg total) by mouth every 8 (eight) hours as needed for nausea or vomiting.   oxybutynin  (DITROPAN -XL) 10 MG 24 hr tablet TAKE 1 TABLET BY MOUTH EVERY NIGHT AT   BEDTIME   Phenylephrine-DM-GG-APAP (DELSYM COUGH/COLD DAYTIME PO) Take by mouth.   zonisamide  (ZONEGRAN ) 100 MG capsule Take 1 capsule (100 mg total) by mouth in the morning, at noon, and at bedtime.   fluticasone  (FLONASE ) 50 MCG/ACT nasal spray Place 2 sprays into both nostrils daily.   No facility-administered medications prior to visit.    Allergies  Allergen Reactions   Avocado Anaphylaxis   Aspirin Other (See Comments)  Aspirin and all aspirin products are not compatible with her seizure medications.   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  That is how she feels when having a seizure. It can cause me to go into a seizure   Phenobarbital Other (See Comments)    Designed for genetic epilepsy, not  encephalitic   Phenytoin Sodium Extended Other (See Comments)    Designed for genetic epilepsy, not encephalitic   Prednisone Other (See Comments)    Caused her to go into cluster seizures   Zoloft [Sertraline] Other (See Comments)    Made seizures worse   Other    Adhesive [Tape] Other (See Comments)    Welt to skin. Rash PAPER TAPE IS OKAY   Ativan  [Lorazepam ] Nausea And Vomiting   Carbamazepine Other (See Comments)    Designed for genetic epilepsy, not encephalitic   Codeine Other (See Comments)    "makes me loopy"   Iodine Rash    Iodine on the skin causes hives   Montelukast Other (See Comments)    Patient was told that it was best that she NOT take this medication   Topiramate Other (See Comments)    Did not work for patient    Patient Care Team: Mazie Speed, MD as PCP - General (Family Medicine) Paulina Bors as Referring Physician (Psychiatry)  Review of Systems       Objective    Vitals: BP 91/62 (BP Location: Left Arm, Patient Position: Sitting, Cuff Size: Large)   Pulse 84   Temp 97.7 F (36.5 C) (Oral)   Resp 16   Ht 5\' 3"  (1.6 m)   Wt 189 lb 14.4 oz (86.1 kg)   SpO2 99%   BMI 33.64 kg/m      Physical Exam Vitals reviewed.  Constitutional:      General: She is not in acute distress.    Appearance: Normal appearance. She is well-developed. She is not diaphoretic.  HENT:     Head: Normocephalic and atraumatic.     Right Ear: Tympanic membrane, ear canal and external ear normal.     Left Ear: Tympanic membrane, ear canal and external ear normal.     Nose: Nose normal.     Mouth/Throat:     Mouth: Mucous membranes are moist.     Pharynx: Oropharynx is clear. No oropharyngeal exudate.  Eyes:     General: No scleral icterus.    Conjunctiva/sclera: Conjunctivae normal.     Pupils: Pupils are equal, round, and reactive to light.  Neck:     Thyroid: No thyromegaly.  Cardiovascular:     Rate and Rhythm: Normal rate and regular rhythm.      Heart sounds: Normal heart sounds. No murmur heard. Pulmonary:     Effort: Pulmonary effort is normal. No respiratory distress.     Breath sounds: Normal breath sounds. No wheezing or rales.  Abdominal:     General: There is no distension.     Palpations: Abdomen is soft.     Tenderness: There is no abdominal tenderness.  Musculoskeletal:        General: No deformity.     Cervical back: Neck supple.  Right lower leg: No edema.     Left lower leg: No edema.  Lymphadenopathy:     Cervical: No cervical adenopathy.  Skin:    General: Skin is warm and dry.     Findings: No rash.  Neurological:     Mental Status: She is alert and oriented to person, place, and time. Mental status is at baseline.  Psychiatric:        Mood and Affect: Mood normal.        Behavior: Behavior normal.     Most recent functional status assessment:    03/08/2023    1:00 AM  In your present state of health, do you have any difficulty performing the following activities:  Hearing? 0  Vision? 0  Difficulty concentrating or making decisions? 0   Most recent fall risk assessment:    09/25/2023    2:46 PM  Fall Risk   Falls in the past year? 1  Number falls in past yr: 1  Injury with Fall? 1  Risk for fall due to : History of fall(s)  Risk for fall due to: Comment seizures    Most recent depression screenings:    09/25/2023    2:46 PM 03/21/2023    3:44 PM  PHQ 2/9 Scores  PHQ - 2 Score 1 0  PHQ- 9 Score 6 2   Most recent cognitive screening:     No data to display         Most recent Audit-C alcohol use screening    09/23/2022   10:44 AM  Alcohol Use Disorder Test (AUDIT)  1. How often do you have a drink containing alcohol? 0  2. How many drinks containing alcohol do you have on a typical day when you are drinking? 0  3. How often do you have six or more drinks on one occasion? 0  AUDIT-C Score 0   A score of 3 or more in women, and 4 or more in men indicates increased risk for  alcohol abuse, EXCEPT if all of the points are from question 1   No results found.  Results for orders placed or performed in visit on 09/25/23  POCT urinalysis dipstick  Result Value Ref Range   Color, UA Yellow    Clarity, UA Cloudy    Glucose, UA Negative Negative   Bilirubin, UA Negative    Ketones, UA Negative    Spec Grav, UA 1.020 1.010 - 1.025   Blood, UA Negative    pH, UA 7.5 5.0 - 8.0   Protein, UA Positive (A) Negative   Urobilinogen, UA 0.2 0.2 or 1.0 E.U./dL   Nitrite, UA Negative    Leukocytes, UA Small (1+) (A) Negative   Appearance     Odor      Assessment & Plan     Annual wellness visit done today including the all of the following: Reviewed patient's Family Medical History Reviewed and updated list of patient's medical providers Assessment of cognitive impairment was done Assessed patient's functional ability Established a written schedule for health screening services Health Risk Assessent Completed and Reviewed  Exercise Activities and Dietary recommendations  Goals   None     Immunization History  Administered Date(s) Administered   Influenza, Seasonal, Injecte, Preservative Fre 04/20/2007, 03/19/2009, 03/04/2011, 03/07/2012, 03/26/2013   Influenza,inj,Quad PF,6+ Mos 03/20/2014, 03/13/2015, 03/14/2016, 03/10/2017, 03/07/2018, 03/29/2019, 03/24/2020, 03/22/2021, 03/09/2022   Influenza-Unspecified 02/14/2023   MenQuadfi_Meningococcal Groups ACYW Conjugate 01/10/2006   Moderna Covid-19 Vaccine Bivalent Booster 45yrs &  up 04/20/2021, 02/11/2022   PFIZER(Purple Top)SARS-COV-2 Vaccination 08/28/2019, 09/18/2019, 05/05/2020   Pfizer(Comirnaty )Fall Seasonal Vaccine 12 years and older 02/22/2023   Tdap 01/19/2005, 01/04/2011    Health Maintenance  Topic Date Due   Zoster Vaccines- Shingrix  (1 of 2) Never done   DTaP/Tdap/Td (3 - Td or Tdap) 01/03/2021   COVID-19 Vaccine (7 - Pfizer risk 2024-25 season) 08/22/2023   INFLUENZA VACCINE  01/05/2024    Medicare Annual Wellness (AWV)  09/24/2024   MAMMOGRAM  10/18/2024   Colonoscopy  04/22/2025   Cervical Cancer Screening (HPV/Pap Cotest)  07/21/2025   Hepatitis C Screening  Completed   HIV Screening  Completed   HPV VACCINES  Aged Out   Meningococcal B Vaccine  Aged Out     Discussed health benefits of physical activity, and encouraged her to engage in regular exercise appropriate for her age and condition.    Problem List Items Addressed This Visit       Other   Avitaminosis D   Relevant Orders   VITAMIN D  25 Hydroxy (Vit-D Deficiency, Fractures)   Other Visit Diagnoses       Encounter for annual wellness visit (AWV) in Medicare patient    -  Primary     Breast cancer screening by mammogram       Relevant Orders   MM 3D SCREENING MAMMOGRAM BILATERAL BREAST     Long-term use of high-risk medication       Relevant Orders   Hemoglobin A1c   Comprehensive metabolic panel with GFR   Lipid panel   CBC w/Diff/Platelet   VITAMIN D  25 Hydroxy (Vit-D Deficiency, Fractures)     Hyperglycemia       Relevant Orders   Hemoglobin A1c     Fever, unspecified fever cause       Relevant Medications   cephALEXin  (KEFLEX ) 500 MG capsule   Other Relevant Orders   POCT urinalysis dipstick (Completed)   Urine Culture     Lateral epicondylitis of right elbow         Acute cystitis without hematuria       Relevant Medications   cephALEXin  (KEFLEX ) 500 MG capsule   Other Relevant Orders   POCT urinalysis dipstick (Completed)   Urine Culture           Epilepsy Recent seizures, possibly triggered by fever. Neurologist informed. No infection detected. Vomiting managed with Zofran . - Continue Keppra  750 mg BID, Celontin  300 mg twice weekly, zonisamide  100 mg TID - Communicate with neurologist regarding recent seizures  Wellness Visit Routine wellness visit. Reports recent fever, seizures, and vomiting. No infection detected. Blood pressure well-controlled, hypertension removed from  problem list. Discussed immunizations and screenings. Shingles vaccine not covered until age 76. Tetanus vaccine administered at [facility]. Mammogram due next month due to previous abnormal result and biopsy. - Perform head-to-toe examination - Order urine test to rule out UTI - Order blood tests for kidney and liver function, cholesterol, blood sugar, and blood counts - Check A1c for diabetes screening - Order mammogram - Follow up on tetanus vaccine record  Lateral epicondylitis Chronic right elbow pain, exacerbated by certain movements, diagnosed as lateral epicondylitis. - Advise icing the elbow - Avoid activities that exacerbate pain - Recommend over-the-counter Voltaren gel for topical application      UA with small leuks - will send culture and empirically treat with Keflex  500mg  TID x5d  Return in about 6 months (around 03/26/2024) for chronic disease f/u.     Janayia Burggraf  David Escort, MD  [facility] [phone] (phone) [phone] (fax)  [facility]

## 2023-09-26 ENCOUNTER — Other Ambulatory Visit: Payer: Self-pay | Admitting: Family Medicine

## 2023-09-26 DIAGNOSIS — R509 Fever, unspecified: Secondary | ICD-10-CM

## 2023-09-26 DIAGNOSIS — N3000 Acute cystitis without hematuria: Secondary | ICD-10-CM

## 2023-09-26 LAB — CBC WITH DIFFERENTIAL/PLATELET
Basophils Absolute: 0 10*3/uL (ref 0.0–0.2)
Basos: 1 %
EOS (ABSOLUTE): 0.2 10*3/uL (ref 0.0–0.4)
Eos: 4 %
Hematocrit: 39.7 % (ref 34.0–46.6)
Hemoglobin: 13.3 g/dL (ref 11.1–15.9)
Immature Grans (Abs): 0 10*3/uL (ref 0.0–0.1)
Immature Granulocytes: 0 %
Lymphocytes Absolute: 2.1 10*3/uL (ref 0.7–3.1)
Lymphs: 41 %
MCH: 32.4 pg (ref 26.6–33.0)
MCHC: 33.5 g/dL (ref 31.5–35.7)
MCV: 97 fL (ref 79–97)
Monocytes Absolute: 0.5 10*3/uL (ref 0.1–0.9)
Monocytes: 11 %
Neutrophils Absolute: 2.2 10*3/uL (ref 1.4–7.0)
Neutrophils: 43 %
Platelets: 259 10*3/uL (ref 150–450)
RBC: 4.11 x10E6/uL (ref 3.77–5.28)
RDW: 13.1 % (ref 11.7–15.4)
WBC: 5.1 10*3/uL (ref 3.4–10.8)

## 2023-09-26 LAB — COMPREHENSIVE METABOLIC PANEL WITH GFR
ALT: 25 IU/L (ref 0–32)
AST: 18 IU/L (ref 0–40)
Albumin: 4.5 g/dL (ref 3.8–4.9)
Alkaline Phosphatase: 198 IU/L — ABNORMAL HIGH (ref 44–121)
BUN/Creatinine Ratio: 20 (ref 9–23)
BUN: 21 mg/dL (ref 6–24)
Bilirubin Total: <0.2 mg/dL (ref 0.0–1.2)
CO2: 22 mmol/L (ref 20–29)
Calcium: 9.2 mg/dL (ref 8.7–10.2)
Chloride: 106 mmol/L (ref 96–106)
Creatinine, Ser: 1.06 mg/dL — ABNORMAL HIGH (ref 0.57–1.00)
Globulin, Total: 1.9 g/dL (ref 1.5–4.5)
Glucose: 80 mg/dL (ref 70–99)
Potassium: 4.7 mmol/L (ref 3.5–5.2)
Sodium: 142 mmol/L (ref 134–144)
Total Protein: 6.4 g/dL (ref 6.0–8.5)
eGFR: 61 mL/min/{1.73_m2} (ref >59)

## 2023-09-26 LAB — HEMOGLOBIN A1C
Est. average glucose Bld gHb Est-mCnc: 105 mg/dL
Hgb A1c MFr Bld: 5.3 % (ref 4.8–5.6)

## 2023-09-26 LAB — LIPID PANEL
Chol/HDL Ratio: 3.2 ratio (ref 0.0–4.4)
Cholesterol, Total: 215 mg/dL — ABNORMAL HIGH (ref 100–199)
HDL: 68 mg/dL (ref >39)
LDL Chol Calc (NIH): 120 mg/dL — ABNORMAL HIGH (ref 0–99)
Triglycerides: 156 mg/dL — ABNORMAL HIGH (ref 0–149)
VLDL Cholesterol Cal: 27 mg/dL (ref 5–40)

## 2023-09-26 LAB — VITAMIN D 25 HYDROXY (VIT D DEFICIENCY, FRACTURES): Vit D, 25-Hydroxy: 41 ng/mL (ref 30.0–100.0)

## 2023-09-26 NOTE — Telephone Encounter (Signed)
 Copied from CRM [id number]. Topic: Clinical - Medication Refill >> Sep 26, 2023 10:48 AM Rosaria Common wrote: Most Recent Primary Care Visit:  Provider: Mazie Speed  Department: [facility]  Visit Type: MEDICARE AWV, SEQUENTIAL  Date: 09/25/2023  Medication:  Keflex  500mg . Pt presents with UTI took 1 pill and lost the rest of the bottle.   Has the patient contacted their pharmacy? Yes (Agent: If no, request that the patient contact the pharmacy for the refill. If patient does not wish to contact the pharmacy document the reason why and proceed with request.) (Agent: If yes, when and what did the pharmacy advise?)  Is this the correct pharmacy for this prescription? Yes If no, delete pharmacy and type the correct one.  This is the patient's preferred pharmacy:  [facility] - [place], [place] - [address] [place] [address] Phone: [phone] Fax: [phone]   Has the prescription been filled recently? Yes  Is the patient out of the medication? Yes  Has the patient been seen for an appointment in the last year OR does the patient have an upcoming appointment? Yes  Can we respond through MyChart? No.   Agent: Please be advised that Rx refills may take up to 3 business days. We ask that you follow-up with your pharmacy.

## 2023-09-27 ENCOUNTER — Other Ambulatory Visit (HOSPITAL_COMMUNITY): Payer: Self-pay

## 2023-09-27 NOTE — Telephone Encounter (Signed)
 Requested medication (s) are due for refill today -no  Requested medication (s) are on the active medication list yes  Future visit scheduled no  Last refill: 09/25/23 #15  Notes to clinic: off protocol- provider review , Medication: Keflex  500mg . Pt presents with UTI took 1 pill and lost the rest of the bottle.   Requested Prescriptions  Pending Prescriptions Disp Refills   cephALEXin  (KEFLEX ) 500 MG capsule 15 capsule 0    Sig: Take 1 capsule (500 mg total) by mouth 3 (three) times daily for 5 days.     Off-Protocol Failed - 09/27/2023  8:17 AM      Failed - Medication not assigned to a protocol, review manually.      Passed - Valid encounter within last 12 months    Recent Outpatient Visits           2 days ago Encounter for annual wellness visit (AWV) in Medicare patient   [facility] [place], Stan Eans, MD                 Requested Prescriptions  Pending Prescriptions Disp Refills   cephALEXin  (KEFLEX ) 500 MG capsule 15 capsule 0    Sig: Take 1 capsule (500 mg total) by mouth 3 (three) times daily for 5 days.     Off-Protocol Failed - 09/27/2023  8:17 AM      Failed - Medication not assigned to a protocol, review manually.      Passed - Valid encounter within last 12 months    Recent Outpatient Visits           2 days ago Encounter for annual wellness visit (AWV) in Medicare patient   [facility], Stan Eans, MD

## 2023-09-28 ENCOUNTER — Other Ambulatory Visit: Payer: Self-pay

## 2023-09-28 ENCOUNTER — Telehealth: Payer: Self-pay

## 2023-09-28 DIAGNOSIS — R509 Fever, unspecified: Secondary | ICD-10-CM

## 2023-09-28 DIAGNOSIS — N3000 Acute cystitis without hematuria: Secondary | ICD-10-CM

## 2023-09-28 DIAGNOSIS — R7989 Other specified abnormal findings of blood chemistry: Secondary | ICD-10-CM

## 2023-09-28 LAB — URINE CULTURE

## 2023-09-28 MED ORDER — CEPHALEXIN 500 MG PO CAPS: 500.0000 mg | ORAL_CAPSULE | Freq: Three times a day (TID) | ORAL | 0 refills | Status: AC

## 2023-09-28 NOTE — Telephone Encounter (Signed)
 Patient is asking for replacement prescription of the antibiotic   Copied from CRM [id number]. Topic: Clinical - Medication Refill >> Sep 26, 2023 10:48 AM Rosaria Common wrote: Most Recent Primary Care Visit:  Provider: Mazie Speed  Department: [facility]  Visit Type: MEDICARE AWV, SEQUENTIAL  Date: 09/25/2023  Medication:  Keflex  500mg . Pt presents with UTI took 1 pill and lost the rest of the bottle.   Has the patient contacted their pharmacy? Yes (Agent: If no, request that the patient contact the pharmacy for the refill. If patient does not wish to contact the pharmacy document the reason why and proceed with request.) (Agent: If yes, when and what did the pharmacy advise?)  Is this the correct pharmacy for this prescription? Yes If no, delete pharmacy and type the correct one.  This is the patient's preferred pharmacy:  [facility] - [place], [place] - [address] [place] [address] Phone: [phone] Fax: [phone]   Has the prescription been filled recently? Yes  Is the patient out of the medication? Yes  Has the patient been seen for an appointment in the last year OR does the patient have an upcoming appointment? Yes  Can we respond through MyChart? No.   Agent: Please be advised that Rx refills may take up to 3 business days. We ask that you follow-up with your pharmacy. >> Sep 28, 2023  2:43 PM Leory Rands wrote: Pt is calling back to report that she found her medication. And no longer needs refill request.

## 2023-09-28 NOTE — Telephone Encounter (Signed)
 Ok to resend keflex  rx

## 2023-09-28 NOTE — Addendum Note (Signed)
 Addended by: [place] on: 09/28/2023 04:31 PM   Modules accepted: Orders

## 2023-10-02 ENCOUNTER — Telehealth: Payer: Self-pay

## 2023-10-02 NOTE — Telephone Encounter (Signed)
 Copied from CRM [id number]. Topic: Appointments - Scheduling Inquiry for Clinic >> Oct 02, 2023  8:54 AM Dorthula Gavel H wrote: Reason for CRM: Pt is wanting to get Mammo scheduled.

## 2023-10-02 NOTE — Telephone Encounter (Signed)
 Order was placed on 09/25/23 Pt needs to contact [phone] to schedule her mammogram.  [facility] for E2C[facility] to advise

## 2023-10-03 ENCOUNTER — Telehealth: Payer: Self-pay

## 2023-10-03 NOTE — Telephone Encounter (Signed)
 Copied from CRM [id number]. Topic: Clinical - Medical Advice >> Oct 03, 2023  1:00 PM Mandy Deleon wrote: Reason for CRM: has vomited 5 times this week, not sure if related to medication, it only happens after going to sleep.

## 2023-10-03 NOTE — Telephone Encounter (Signed)
 Yes. Needs to be re-assessed.  She was treated for UTI at recent visit.

## 2023-10-03 NOTE — Telephone Encounter (Signed)
 Patient advised to contact [phone] to schedule mammogram. Transferred patient to phone number.

## 2023-10-04 NOTE — Telephone Encounter (Signed)
 Pt appointment scheduled for 10/05/23 with Dr.Robinson

## 2023-10-05 ENCOUNTER — Telehealth: Payer: Self-pay

## 2023-10-05 ENCOUNTER — Ambulatory Visit (INDEPENDENT_AMBULATORY_CARE_PROVIDER_SITE_OTHER): Admitting: Family Medicine

## 2023-10-05 ENCOUNTER — Encounter: Payer: Self-pay | Admitting: Family Medicine

## 2023-10-05 VITALS — BP 96/70 | HR 91 | Ht 63.0 in | Wt 187.0 lb

## 2023-10-05 DIAGNOSIS — G40909 Epilepsy, unspecified, not intractable, without status epilepticus: Secondary | ICD-10-CM | POA: Diagnosis not present

## 2023-10-05 DIAGNOSIS — Z9689 Presence of other specified functional implants: Secondary | ICD-10-CM | POA: Diagnosis not present

## 2023-10-05 DIAGNOSIS — R112 Nausea with vomiting, unspecified: Secondary | ICD-10-CM | POA: Diagnosis not present

## 2023-10-05 MED ORDER — ONDANSETRON 4 MG PO TBDP: 4.0000 mg | ORAL_TABLET | Freq: Three times a day (TID) | ORAL | 0 refills | Status: AC | PRN

## 2023-10-05 NOTE — Telephone Encounter (Signed)
 Called but was only able to leave a voice message informing the pt per Dr R recommendation she has been scheduled to see Neurology on May 7 at 9am with Dr Andrena Ke. She has been advised to reach and reschedule if the time didn't fit for her scheduled.

## 2023-10-05 NOTE — Patient Instructions (Addendum)
  VISIT SUMMARY: During your visit, we discussed your recent episodes of recurrent vomiting that began on April 18th. We reviewed your history of generalized epileptic seizures and recent treatment for a urinary tract infection. We also examined potential causes for your symptoms and planned further evaluations.  YOUR PLAN: -VOMITING: Vomiting is the forceful expulsion of stomach contents through the mouth. You have been experiencing intermittent vomiting since April 18th, sometimes associated with your seizures. We will conduct blood tests (CBC and CMP) to check for dehydration or metabolic issues, and blood cultures to rule out infection. Additionally, we will perform chest and abdominal x-rays to look for any underlying problems. We will also contact your neurologist to discuss your case and expedite your appointment. Ensure you have enough Zofran  to manage nausea.  Please report to [facility] located at:  [address]  [place], [place] [address]  You do not need an appointment to have xrays completed.   Our office will follow up with  results once available.    -GENERALIZED EPILEPTIC SEIZURES: Generalized epileptic seizures are episodes of abnormal electrical activity in the brain that affect both sides of the brain. Your recent increase in vomiting may be related to your seizure activity. We will contact your neurologist to discuss your case and possibly expedite your appointment. Brain imaging may be considered if advised by your neurologist.  INSTRUCTIONS: Please follow up with the recommended blood tests (CBC and CMP), blood cultures, and chest and abdominal x-rays. We will also be in touch with your neurologist to discuss your case and expedite your appointment. Ensure you have an adequate supply of Zofran  for nausea management. Monitor for any recurrence of urinary symptoms and report them if they  occur.                      Contains text generated by Abridge.                                 Contains text generated by Abridge.

## 2023-10-05 NOTE — Progress Notes (Signed)
 ACUTE PATIENT VISIT    Patient: Mandy Deleon   DOB: 03/27/1966   58 y.o. Female  MRN: [id number] Visit Date: 10/05/2023  Today's healthcare provider: Mimi Alt, MD   PCP: Mazie Speed, MD   Chief Complaint  Patient presents with   Emesis    She has 6 episodes of vomiting   18th she vomited twice and had a seizure she also vomited 20th 22nd  27th and  29th she is not sure why she is vomiting.     Subjective     HPI     Emesis    Additional comments: She has 6 episodes of vomiting   18th she vomited twice and had a seizure she also vomited 20th 22nd  27th and  29th she is not sure why she is vomiting.       Last edited by Bart Lieu, CMA on 10/05/2023 10:00 AM.       Discussed the use of AI scribe software for clinical note transcription with the patient, who gave verbal consent to proceed.  History of Present Illness          Discussed the use of AI scribe software for clinical note transcription with the patient, who gave verbal consent to proceed.  History of Present Illness Mandy Deleon "Hazen Liter" is a 58 year old female with non-interactable generalized epileptic seizures who presents with recurrent vomiting.  She has been experiencing recurrent vomiting since April 18th, with episodes on April 18th, 20th, 22nd, 27th, and 29th. The vomiting is intermittent, often occurring when she is lying in bed, and is preceded by hypersalivation. She uses a plastic trash can for emesis, takes Zofran  for nausea relief, and returns to bed after oral hygiene. No associated symptoms such as headache, diarrhea, fever, or chills are present, although she notes her temperature has been running low. She denies any recent upper respiratory infections or flu-like symptoms.  She has a history of non-interactable generalized epileptic seizures and has undergone five brain surgeries. A vagus nerve stimulator is implanted. Her last neurology follow-up was on  April 2nd, prior to the onset of vomiting. She experienced a seizure on April 18th, coinciding with the vomiting episodes.  She was treated for acute cystitis on April 21st with antibiotics, which she completed after a brief interruption. The vomiting began before the UTI treatment and persisted despite it.  She keeps a detailed record of her symptoms, marking seizures with an asterisk and vomiting with a date. She notes that the vomit sometimes appears green, brown, or dark red, depending on her last meal.       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    Cenobamate (XCOPRI) 100 MG TABS Take by mouth at bedtime. 100mg  every morning,  200mg  qhs, except 300mg  qhs on Wed   cetirizine (ZYRTEC) 10 MG tablet TAKE ONE TABLET BY MOUTH ONCE DAILY   cholecalciferol (VITAMIN D3) 25 MCG (1000 UNIT) tablet Take 1,000 Units by mouth daily.   Fish Oil-Cholecalciferol (OMEGA-3 + D PO) Take 1 capsule by mouth daily in the afternoon.   fluticasone  (FLONASE ) 50 MCG/ACT nasal spray Place 2 sprays into both nostrils daily.   lansoprazole (PREVACID) 15 MG capsule Take 15 mg by mouth.   levETIRAcetam  (KEPPRA ) 750 MG tablet Take 750 mg by mouth 2 (two) times daily.   Melatonin 5 MG CAPS Take 5 mg by mouth at bedtime.    Methsuximide  (CELONTIN ) 300 MG CAPS Take 300 mg by mouth 2 (two) times a  week.   ondansetron  (ZOFRAN ) 4 MG tablet Take 1 tablet (4 mg total) by mouth every 8 (eight) hours as needed for nausea or vomiting.   oxybutynin  (DITROPAN -XL) 10 MG 24 hr tablet TAKE 1 TABLET BY MOUTH EVERY NIGHT AT   BEDTIME   Phenylephrine-DM-GG-APAP (DELSYM COUGH/COLD DAYTIME PO) Take by mouth.   zonisamide  (ZONEGRAN ) 100 MG capsule Take 1 capsule (100 mg total) by mouth in the morning, at noon, and at bedtime.   No facility-administered medications prior to visit.    Review of Systems  Lab Results  Component Value Date   WBC 5.1 09/25/2023   HGB 13.3 09/25/2023   HCT 39.7 09/25/2023   MCV 97 09/25/2023   PLT 259 09/25/2023    Last metabolic panel Lab Results  Component Value Date   GLUCOSE 80 09/25/2023   NA 142 09/25/2023   K 4.7 09/25/2023   CL 106 09/25/2023   CO2 22 09/25/2023   BUN 21 09/25/2023   CREATININE 1.06 (H) 09/25/2023   EGFR 61 09/25/2023   CALCIUM 9.2 09/25/2023   PROT 6.4 09/25/2023   ALBUMIN 4.5 09/25/2023   LABGLOB 1.9 09/25/2023   AGRATIO 2.2 09/23/2022   BILITOT <0.2 09/25/2023   ALKPHOS 198 (H) 09/25/2023   AST 18 09/25/2023   ALT 25 09/25/2023   ANIONGAP 11 03/31/2023   Last lipids Lab Results  Component Value Date   CHOL 215 (H) 09/25/2023   HDL 68 09/25/2023   LDLCALC 120 (H) 09/25/2023   TRIG 156 (H) 09/25/2023   CHOLHDL 3.2 09/25/2023   Last hemoglobin A1c Lab Results  Component Value Date   HGBA1C 5.3 09/25/2023   Last thyroid functions Lab Results  Component Value Date   TSH 1.960 09/23/2022        Objective    BP 96/70   Pulse 91   Ht 5\' 3"  (1.6 m)   Wt 187 lb (84.8 kg)   SpO2 96%   BMI 33.13 kg/m  BP Readings from Last 3 Encounters:  10/05/23 96/70  09/25/23 91/62  04/18/23 100/62   Wt Readings from Last 3 Encounters:  10/05/23 187 lb (84.8 kg)  09/25/23 189 lb 14.4 oz (86.1 kg)  04/18/23 183 lb 1.6 oz (83.1 kg)        Physical Exam  Physical Exam VITALS: BP- 97/70 GENERAL: No acute  distress HEENT: Moist mucous membranes CARDIOVASCULAR: Regular rate and rhythm ABDOMEN: Abdomen normal. Normal bowel sounds. NEUROLOGICAL: Pupils equal and reactive to light bilaterally. Mental status at baseline.    No results found for any visits on 10/05/23.  Assessment & Plan     Problem List Items Addressed This Visit       Digestive   Nausea and vomiting - Primary  Relevant Medications   ondansetron  (ZOFRAN -ODT) 4 MG disintegrating tablet   Other Relevant Orders   CBC   CMP14+EGFR   Blood culture (routine single)   DG Chest 2 View   DG Abd 1 View     Nervous and Auditory   Seizure disorder [facility])   Relevant Orders   CBC   CMP14+EGFR   Blood culture (routine single)   Other Visit Diagnoses       S/P placement of VNS (vagus nerve stimulation) device       Relevant Orders   CBC   CMP14+EGFR   Blood culture (routine single)         Assessment & Plan Vomiting Intermittent vomiting since April 18, with no clear trigger related to food intake or other illnesses. Episodes sometimes associated with seizures. No fever, diarrhea, or upper respiratory symptoms. Vomitus varies in color, sometimes green, brown, or dark red. No abdominal pain or tenderness on examination. Differential diagnosis includes possible infection or neurological cause related to seizures and recent antibiotic use for UTI. - Order CBC and CMP to assess for dehydration or metabolic abnormalities. - Order blood cultures to rule out systemic infection. - Order chest and abdominal x-ray to evaluate for underlying abnormalities. - Contact neurologist to discuss the case and expedite appointment. - Ensure adequate supply of Zofran  for nausea management.  Generalized epileptic seizures Non-intractable generalized epileptic seizures. Recent increase in vomiting may be related to seizure activity. Last neurology follow-up was on April 2. Next appointment is in September. Possible increased sensitivity  due to seizures, potentially exacerbating vomiting. - Contact neurologist to discuss the case and expedite appointment. - Consider brain imaging if advised by neurology.  Acute cystitis Recently treated with antibiotics. Vomiting began prior to treatment and persists despite completion. No current symptoms suggestive of ongoing UTI. Consider possible systemic infection despite treatment. - Monitor for recurrence of urinary symptoms.   Will request earlier visit with neurology office due to hx of vagus nerve stimulator     Return in about 4 weeks (around 11/02/2023) for VOMITING .         Mimi Alt, MD  [facility] [phone] (phone) [phone] (fax)  [facility]

## 2023-10-06 ENCOUNTER — Other Ambulatory Visit: Payer: Self-pay | Admitting: Family Medicine

## 2023-10-06 DIAGNOSIS — E875 Hyperkalemia: Secondary | ICD-10-CM

## 2023-10-07 LAB — CMP14+EGFR
ALT: 22 IU/L (ref 0–32)
AST: 19 IU/L (ref 0–40)
Albumin: 4.2 g/dL (ref 3.8–4.9)
Alkaline Phosphatase: 194 IU/L — ABNORMAL HIGH (ref 44–121)
BUN/Creatinine Ratio: 16 (ref 9–23)
BUN: 17 mg/dL (ref 6–24)
Bilirubin Total: 0.2 mg/dL (ref 0.0–1.2)
CO2: 20 mmol/L (ref 20–29)
Calcium: 9.5 mg/dL (ref 8.7–10.2)
Chloride: 108 mmol/L — ABNORMAL HIGH (ref 96–106)
Creatinine, Ser: 1.07 mg/dL — ABNORMAL HIGH (ref 0.57–1.00)
Globulin, Total: 2 g/dL (ref 1.5–4.5)
Glucose: 93 mg/dL (ref 70–99)
Potassium: 6.1 mmol/L — ABNORMAL HIGH (ref 3.5–5.2)
Sodium: 143 mmol/L (ref 134–144)
Total Protein: 6.2 g/dL (ref 6.0–8.5)
eGFR: 60 mL/min/{1.73_m2} (ref >59)

## 2023-10-07 LAB — CBC
Hematocrit: 38 % (ref 34.0–46.6)
Hemoglobin: 13 g/dL (ref 11.1–15.9)
MCH: 32.8 pg (ref 26.6–33.0)
MCHC: 34.2 g/dL (ref 31.5–35.7)
MCV: 96 fL (ref 79–97)
Platelets: 255 10*3/uL (ref 150–450)
RBC: 3.96 x10E6/uL (ref 3.77–5.28)
RDW: 13 % (ref 11.7–15.4)
WBC: 5 10*3/uL (ref 3.4–10.8)

## 2023-10-11 LAB — CULTURE, BLOOD (SINGLE)

## 2023-10-13 MED ORDER — LOKELMA 5 G PO PACK: 10.0000 g | PACK | Freq: Every day | ORAL | 0 refills | Status: AC

## 2023-10-13 NOTE — Addendum Note (Signed)
 Addended by: SIMMONS-ROBINSON, Tamitha Norell L on: 10/13/2023 02:27 PM   Modules accepted: Orders

## 2023-10-16 ENCOUNTER — Other Ambulatory Visit
Admission: RE | Admit: 2023-10-16 | Discharge: 2023-10-16 | Disposition: A | Discharge status: Home / Self Care | Attending: Nurse Practitioner | Admitting: Nurse Practitioner

## 2023-10-16 ENCOUNTER — Other Ambulatory Visit: Payer: Self-pay | Admitting: Nurse Practitioner

## 2023-10-16 ENCOUNTER — Encounter: Payer: Self-pay | Admitting: Nurse Practitioner

## 2023-10-16 ENCOUNTER — Ambulatory Visit (INDEPENDENT_AMBULATORY_CARE_PROVIDER_SITE_OTHER): Admitting: Nurse Practitioner

## 2023-10-16 VITALS — BP 90/60 | HR 98 | Ht 63.0 in | Wt 187.0 lb

## 2023-10-16 DIAGNOSIS — E875 Hyperkalemia: Secondary | ICD-10-CM

## 2023-10-16 DIAGNOSIS — R112 Nausea with vomiting, unspecified: Secondary | ICD-10-CM | POA: Diagnosis present

## 2023-10-16 LAB — COMPREHENSIVE METABOLIC PANEL WITH GFR
ALT: 31 U/L (ref 0–44)
AST: 30 U/L (ref 15–41)
Albumin: 3.7 g/dL (ref 3.5–5.0)
Alkaline Phosphatase: 145 U/L — ABNORMAL HIGH (ref 38–126)
Anion gap: 9 (ref 5–15)
BUN: 23 mg/dL — ABNORMAL HIGH (ref 6–20)
CO2: 25 mmol/L (ref 22–32)
Calcium: 9.1 mg/dL (ref 8.9–10.3)
Chloride: 107 mmol/L (ref 98–111)
Creatinine, Ser: 1.3 mg/dL — ABNORMAL HIGH (ref 0.44–1.00)
GFR, Estimated: 47 mL/min — ABNORMAL LOW (ref >60)
Glucose, Bld: 143 mg/dL — ABNORMAL HIGH (ref 70–99)
Potassium: 4.8 mmol/L (ref 3.5–5.1)
Sodium: 141 mmol/L (ref 135–145)
Total Bilirubin: 0.4 mg/dL (ref 0.0–1.2)
Total Protein: 7 g/dL (ref 6.5–8.1)

## 2023-10-16 MED ORDER — OMEPRAZOLE 20 MG PO CPDR: 20.0000 mg | DELAYED_RELEASE_CAPSULE | Freq: Every day | ORAL | 0 refills | Status: DC

## 2023-10-16 NOTE — Progress Notes (Signed)
 BP 90/60   Pulse 98   Ht 5\' 3"  (1.6 m)   Wt 187 lb (84.8 kg)   SpO2 96%   BMI 33.13 kg/m    Subjective:    Patient ID: Mandy Deleon, female    DOB: April 17, 1966, 58 y.o.   MRN: [id number]  HPI: Mandy Deleon is a 58 y.o. female  Chief Complaint  Patient presents with   Emesis    Discussed the use of AI scribe software for clinical note transcription with the patient, who gave verbal consent to proceed.  History of Present Illness Mandy Deleon "Hazen Liter" is a 58 year old female who presents with recurrent nausea and vomiting.  She has been experiencing recurrent vomiting since April 18th, often occurring intermittently when lying in bed. There have been nine episodes of nausea and vomiting since the last visit. The vomiting is sometimes preceded by increased saliva production while lying down, prompting her to sit up and vomit into a trash can.  She uses Zofran  for nausea relief, which she finds helpful, usually taking it after vomiting. She has not yet started taking Lokelma, which was prescribed to manage her high potassium levels. During her last visit, her potassium level was noted to be 6.1, and she was prescribed Lokelma 10 grams daily for five days. She picked up the medication from the pharmacy but has not yet started taking it.  She reports experiencing diarrhea today but no abdominal pain. She occasionally takes medication for acid reflux if needed. No recent illness other than the vomiting and diarrhea.  She is engaged and plans to get married next month.         09/25/2023    2:46 PM 03/21/2023    3:44 PM 09/23/2022   10:43 AM  Depression screen PHQ 2/9  Decreased Interest 1 0 0  Down, Depressed, Hopeless 0 0 0  PHQ - 2 Score 1 0 0  Altered sleeping 0 1 0  Tired, decreased energy 2 1 0  Change in appetite 3 0 0  Feeling bad or failure about yourself  0 0 0  Trouble concentrating 0 0 0  Moving slowly or fidgety/restless 0 0 0  Suicidal thoughts 0 0 0   PHQ-9 Score 6 2 0  Difficult doing work/chores Extremely dIfficult Somewhat difficult Not difficult at all    Relevant past medical, surgical, family and social history reviewed and updated as indicated. Interim medical history since our last visit reviewed. Allergies and medications reviewed and updated.  Review of Systems  Ten systems reviewed and is negative except as mentioned in HPI      Objective:      BP 90/60   Pulse 98   Ht 5\' 3"  (1.6 m)   Wt 187 lb (84.8 kg)   SpO2 96%   BMI 33.13 kg/m    Wt Readings from Last 3 Encounters:  10/16/23 187 lb (84.8 kg)  10/05/23 187 lb (84.8 kg)  09/25/23 189 lb 14.4 oz (86.1 kg)    Physical Exam Vitals reviewed.  Constitutional:      Appearance: Normal appearance.  HENT:     Head: Normocephalic.  Cardiovascular:     Rate and Rhythm: Normal rate and regular rhythm.  Pulmonary:     Effort: Pulmonary effort is normal.     Breath sounds: Normal breath sounds.  Musculoskeletal:        General: Normal range of motion.  Skin:    General: Skin is warm and  dry.  Neurological:     General: No focal deficit present.     Mental Status: She is alert and oriented to person, place, and time. Mental status is at baseline.  Psychiatric:        Mood and Affect: Mood normal.        Behavior: Behavior normal.        Thought Content: Thought content normal.        Judgment: Judgment normal.        Results for orders placed or performed in visit on 10/06/23  CMP14+EGFR   Collection Time: 10/06/23 10:54 AM  Result Value Ref Range   Glucose 93 70 - 99 mg/dL   BUN 17 6 - 24 mg/dL   Creatinine, Ser 1.61 (H) 0.57 - 1.00 mg/dL   eGFR 60 >09 UE/AVW/0.98   BUN/Creatinine Ratio 16 9 - 23   Sodium 143 134 - 144 mmol/L   Potassium 6.1 (H) 3.5 - 5.2 mmol/L   Chloride 108 (H) 96 - 106 mmol/L   CO2 20 20 - 29 mmol/L   Calcium 9.5 8.7 - 10.2 mg/dL   Total Protein 6.2 6.0 - 8.5 g/dL   Albumin 4.2 3.8 - 4.9 g/dL   Globulin, Total 2.0 1.5 -  4.5 g/dL   Bilirubin Total 0.2 0.0 - 1.2 mg/dL   Alkaline Phosphatase 194 (H) 44 - 121 IU/L   AST 19 0 - 40 IU/L   ALT 22 0 - 32 IU/L  CBC   Collection Time: 10/06/23 10:54 AM  Result Value Ref Range   WBC 5.0 3.4 - 10.8 x10E3/uL   RBC 3.96 3.77 - 5.28 x10E6/uL   Hemoglobin 13.0 11.1 - 15.9 g/dL   Hematocrit 11.9 14.7 - 46.6 %   MCV 96 79 - 97 fL   MCH 32.8 26.6 - 33.0 pg   MCHC 34.2 31.5 - 35.7 g/dL   RDW 82.9 56.2 - 13.0 %   Platelets 255 150 - 450 x10E3/uL          Assessment & Plan:   Problem List Items Addressed This Visit       Digestive   Nausea and vomiting - Primary   Relevant Orders   Comprehensive metabolic panel with GFR   Other Visit Diagnoses       Hyperkalemia       Relevant Orders   Comprehensive metabolic panel with GFR        Assessment and Plan Assessment & Plan Hyperkalemia Previously elevated potassium level of 6.1 mmol/L, potentially contributing to nausea and vomiting. High potassium can affect cardiac function, posing a risk to heart health. Immediate concern is to assess current potassium level to determine need for emergency intervention. - Order blood test for potassium level at the hospital to assess current status. - Consider emergency room visit if potassium level is elevated. - Initiate treatment with Lokelma if potassium level is not higher than previous measurement.  Nausea and vomiting Recurrent vomiting since April 18, often occurring when lying in bed. Intermittent episodes without associated pain. Differential diagnosis includes hyperkalemia and possible gastroesophageal reflux disease (GERD). Zofran  provides relief for nausea. - Prescribe acid reduction medication to be taken daily if potassium level is not elevated.        Follow up plan: Return if symptoms worsen or fail to improve, for with pcp.

## 2023-10-17 ENCOUNTER — Ambulatory Visit: Payer: Self-pay

## 2023-10-20 ENCOUNTER — Ambulatory Visit
Admission: RE | Admit: 2023-10-20 | Discharge: 2023-10-20 | Disposition: A | Discharge status: Home / Self Care | Source: Ambulatory Visit | Attending: Family Medicine | Admitting: Family Medicine

## 2023-10-20 DIAGNOSIS — Z1231 Encounter for screening mammogram for malignant neoplasm of breast: Secondary | ICD-10-CM | POA: Insufficient documentation

## 2023-10-23 ENCOUNTER — Telehealth: Payer: Self-pay

## 2023-10-23 NOTE — Telephone Encounter (Signed)
 Tried returning call to patient but had to leave a message.  Dr B believes the elevated potassium is probably a result of her increased episodes of seizures.  She should follow up with neuro regarding her seizures.  She is to let us  know if she is vomiting when she has the seizures or unrelated to the seizures and if she is still having episodes of vomiting.    Copied from CRM [id number]. Topic: Clinical - Medical Advice >> Oct 23, 2023  1:17 PM Carlatta H wrote: Reason for CRM: Patient called to advised that she has had episodes of vomiting and seizures from 4/14 up until now//He potassium level is over 6//please call patient

## 2023-10-24 ENCOUNTER — Telehealth: Payer: Self-pay

## 2023-10-24 NOTE — Telephone Encounter (Signed)
 Mandy Deleon saw her last week and changed lansoprazole to omeprazole . Is she taking either right now?  If not taking either, ok to eRx lansoprazole 15mg  daily #30 r2. Make sure only 1 PPI on med list.

## 2023-10-24 NOTE — Telephone Encounter (Signed)
 Copied from CRM [id number]. Topic: Clinical - Medication Question >> Oct 24, 2023  9:43 AM Carlatta H wrote: Reason for CRM: Patient neurologist would like PCP to prescribe Lansoprazole for stomach issues//Please call patient to advise//Patient does not know if vomiting is related to seizures or not//

## 2023-10-26 ENCOUNTER — Ambulatory Visit: Payer: Self-pay | Admitting: Family Medicine

## 2023-10-27 NOTE — Telephone Encounter (Signed)
 LVMTCB

## 2023-11-02 ENCOUNTER — Encounter: Payer: Self-pay | Admitting: Family Medicine

## 2023-11-02 ENCOUNTER — Ambulatory Visit (INDEPENDENT_AMBULATORY_CARE_PROVIDER_SITE_OTHER): Admitting: Family Medicine

## 2023-11-02 VITALS — BP 102/73 | HR 87 | Temp 95.8°F | Ht 63.0 in | Wt 185.2 lb

## 2023-11-02 DIAGNOSIS — K219 Gastro-esophageal reflux disease without esophagitis: Secondary | ICD-10-CM | POA: Diagnosis not present

## 2023-11-02 DIAGNOSIS — R112 Nausea with vomiting, unspecified: Secondary | ICD-10-CM

## 2023-11-02 MED ORDER — LANSOPRAZOLE 15 MG PO CPDR: 15.0000 mg | DELAYED_RELEASE_CAPSULE | Freq: Every day | ORAL | 3 refills | Status: AC

## 2023-11-02 NOTE — Assessment & Plan Note (Signed)
 Patient has been experiencing episodes of indigestion with nausea and vomiting since discontinuing her lansoprazole. Zofran  helps to abort these symptoms but she is not taking the medication until after the episode of emesis. She and her neurologist would like her to start that medication back up which we are in agreement with. We informed her to not take the omeprazole  with the lansoprazole and if her symptoms do not improve we can consider increasing her dose. -Lansoprazole sent in -following up in October for routine exam or sooner if necessary.

## 2023-11-02 NOTE — Assessment & Plan Note (Signed)
 Patient has been experiencing nausea and vomiting since discontinuing her lansoprazole. Zofran  helps to abort these symptoms but she is not taking the medication until after the episode of emesis. We will resume her lansoprazole to  see if this improves her symptoms. -Continue zofran  prn -resume lansoprazole

## 2023-11-02 NOTE — Progress Notes (Signed)
 Established Patient Office Visit  Subjective   Patient ID: Mandy Deleon, female    DOB: 06-18-1965  Age: 58 y.o. MRN: [id number]  Chief Complaint  Patient presents with   Follow-up   Emesis    Reports that she doing pretty well with vomiting since she is taking Ondansetron , stated that it has improved.  She stated that once she vomits she will take one and it does help.  Vomited on April 27, April 29, May 4, May 7, May 11, and May 14.    Reports that her neurologist had her on Lansoprazole and he took her off it on April 2.  He suggested that the provider put her back on Lanoprazole and patient states when she was took off this medication that is when all the problems started.       Mandy Deleon is a 58 y/o patient with history of GERD and seizures presenting today for follow-up on episodes of vomiting following discontinuation of her lansoprazole in April. She has noted episodes of vomiting over the past month that is worse when she lays down at night. She also endorses some indigestion. She was initially taken off of the medication because she was feeling better from a GI perspective. During this time, she has also been taking omeprazole  in the morning but this has not been helping her so much. She was prescribed Zofran  to take as needed which has been helpful for her nausea and episodes of vomiting. She denies any changes in bowel or bladder habits, unintentional weight loss, or hematemesis.  She was hyperkalemic 3 weeks ago at 6.1 but labs have normalized for her as of two weeks ago. She thinks this could be because she was drinking many ensure shakes which were high in potassium but she has stopped taking them so much. She denies any chest pain, palpitations, or shortness of breath.  She also recently had a seizure but is doing okay today and is actively being managed by neurology..    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   CATARACT EXTRACTION W/PHACO Left 09/29/2020   Procedure: CATARACT EXTRACTION PHACO AND INTRAOCULAR LENS PLACEMENT (IOC) LEFT 11.62 01:05.0;  Surgeon: Clair Crews, MD;  Location: [facility];  Service: Ophthalmology;  Laterality: Left;   CATARACT EXTRACTION W/PHACO Right 10/13/2020   Procedure: CATARACT EXTRACTION PHACO AND INTRAOCULAR LENS PLACEMENT (IOC) RIGHT;  Surgeon: Clair Crews, MD;  Location: [facility];  Service: Ophthalmology;  Laterality: Right;  CDE 4.17 00:23.0 minutes   CHOLECYSTECTOMY     CRANIECTOMY / CRANIOTOMY FOR IMPLANTATION NEUROSTIMULATOR ELECTRODES  2011   EYE SURGERY  10/2019   HYSTEROSCOPY WITH D & C Bilateral 11/08/2019   Procedure: DILATATION AND CURETTAGE /HYSTEROSCOPY;  Surgeon: Luster Salters,  Gretel Leaven, MD;  Location: [facility];  Service: Gynecology;  Laterality: Bilateral;   PARS PLANA VITRECTOMY Left 2021   RNS     responsive neurostimulation for seizures. (brain electrodes)   TONSILLECTOMY     vagas nerve stimulator  2011   Review of Systems  Constitutional: Negative.   HENT: Negative.    Eyes: Negative.   Respiratory: Negative.    Cardiovascular: Negative.   Gastrointestinal:  Positive for nausea and vomiting.  Genitourinary: Negative.   Musculoskeletal: Negative.   Skin: Negative.       Objective:     BP 102/73 (BP Location: Left Arm, Patient Position: Sitting, Cuff Size: Normal)   Pulse 87   Temp (!) 95.8 F (35.4 C) (Oral)   Ht 5\' 3"  (1.6 m)   Wt 185 lb 3.2 oz (84 kg)   SpO2 100%   BMI 32.81 kg/m  BP Readings from Last 3 Encounters:  11/02/23 102/73  10/16/23 90/60  10/05/23 96/70   Wt Readings from Last 3 Encounters:  11/02/23 185 lb 3.2 oz (84 kg)  10/16/23 187 lb (84.8 kg)  10/05/23 187 lb (84.8 kg)      Physical Exam Constitutional:      General: She is not in acute distress.    Appearance: Normal appearance. She is not ill-appearing.  HENT:     Head: Normocephalic and atraumatic.     Right Ear: External ear normal.     Left Ear: External ear normal.     Nose: Nose normal.     Mouth/Throat:     Mouth: Mucous membranes are moist.     Pharynx: Oropharynx is clear. No oropharyngeal exudate.  Eyes:     General: No scleral icterus.       Right eye: No discharge.        Left eye: No discharge.     Extraocular Movements: Extraocular movements intact.     Conjunctiva/sclera: Conjunctivae normal.     Pupils: Pupils are equal, round, and reactive to light.  Cardiovascular:     Rate and Rhythm: Normal rate and regular rhythm.     Pulses: Normal pulses.     Heart sounds: Normal heart sounds. No murmur heard.    No friction rub. No gallop.  Pulmonary:     Effort: Pulmonary effort is normal. No respiratory distress.     Breath sounds: Normal breath sounds. No wheezing or rales.  Abdominal:     General: Abdomen is flat. Bowel sounds are normal. There is no distension.     Palpations: Abdomen is soft.     Tenderness: There is no abdominal tenderness. There is no guarding.  Musculoskeletal:        General: No swelling or deformity. Normal range of motion.     Cervical back: Normal range of motion.  Skin:    General: Skin is warm and dry.     Coloration: Skin is not jaundiced.  Neurological:     Mental Status: She is alert.    No results found  for any visits on 11/02/23.  Last CBC Lab Results  Component Value Date   WBC 5.0 10/06/2023   HGB 13.0 10/06/2023   HCT 38.0 10/06/2023   MCV 96 10/06/2023   MCH 32.8 10/06/2023   RDW 13.0 10/06/2023   PLT 255 10/06/2023   Last metabolic panel Lab Results  Component Value Date   GLUCOSE 143 (H) 10/16/2023   NA 141 10/16/2023   K 4.8 10/16/2023   CL 107  10/16/2023   CO2 25 10/16/2023   BUN 23 (H) 10/16/2023   CREATININE 1.30 (H) 10/16/2023   GFRNONAA 47 (L) 10/16/2023   CALCIUM 9.1 10/16/2023   PROT 7.0 10/16/2023   ALBUMIN 3.7 10/16/2023   LABGLOB 2.0 10/06/2023   AGRATIO 2.2 09/23/2022   BILITOT 0.4 10/16/2023   ALKPHOS 145 (H) 10/16/2023   AST 30 10/16/2023   ALT 31 10/16/2023   ANIONGAP 9 10/16/2023   Last lipids Lab Results  Component Value Date   CHOL 215 (H) 09/25/2023   HDL 68 09/25/2023   LDLCALC 120 (H) 09/25/2023   TRIG 156 (H) 09/25/2023   CHOLHDL 3.2 09/25/2023   Last hemoglobin A1c Lab Results  Component Value Date   HGBA1C 5.3 09/25/2023   Last thyroid functions Lab Results  Component Value Date   TSH 1.960 09/23/2022      The 10-year ASCVD risk score (Arnett DK, et al., 2019) is: 1.7%    Assessment & Plan:   Problem List Items Addressed This Visit       Digestive   Gastroesophageal reflux disease without esophagitis   Patient has been experiencing episodes of indigestion with nausea and vomiting since discontinuing her lansoprazole. Zofran  helps to abort these symptoms but she is not taking the medication until after the episode of emesis. She and her neurologist would like her to start that medication back up which we are in agreement with. We informed her to not take the omeprazole  with the lansoprazole and if her symptoms do not improve we can consider increasing her dose. -Lansoprazole sent in -following up in October for routine exam or sooner if necessary.      Relevant Medications   lansoprazole (PREVACID) 15 MG capsule    Nausea and vomiting - Primary   Patient has been experiencing nausea and vomiting since discontinuing her lansoprazole. Zofran  helps to abort these symptoms but she is not taking the medication until after the episode of emesis. We will resume her lansoprazole to  see if this improves her symptoms. -Continue zofran  prn -resume lansoprazole       Return for as scheduled.    Monda Angry, Medical Student    Patient seen along with MS3 student, Luther Saltness. I personally evaluated this patient along with the student, and verified all aspects of the history, physical exam, and medical decision making as documented by the student. I agree with the student's documentation and have made all necessary edits.  Heer Justiss, Stan Eans, MD, MPH [facility]

## 2023-11-13 ENCOUNTER — Other Ambulatory Visit: Payer: Self-pay | Admitting: Family Medicine

## 2023-12-05 ENCOUNTER — Other Ambulatory Visit: Payer: Self-pay

## 2023-12-05 ENCOUNTER — Emergency Department
Admission: EM | Admit: 2023-12-05 | Discharge: 2023-12-06 | Disposition: A | Discharge status: Home / Self Care | Attending: Emergency Medicine | Admitting: Emergency Medicine

## 2023-12-05 ENCOUNTER — Emergency Department

## 2023-12-05 DIAGNOSIS — W01198A Fall on same level from slipping, tripping and stumbling with subsequent striking against other object, initial encounter: Secondary | ICD-10-CM | POA: Insufficient documentation

## 2023-12-05 DIAGNOSIS — S0101XA Laceration without foreign body of scalp, initial encounter: Secondary | ICD-10-CM | POA: Diagnosis not present

## 2023-12-05 DIAGNOSIS — R569 Unspecified convulsions: Secondary | ICD-10-CM | POA: Insufficient documentation

## 2023-12-05 DIAGNOSIS — G40919 Epilepsy, unspecified, intractable, without status epilepticus: Secondary | ICD-10-CM

## 2023-12-05 DIAGNOSIS — N189 Chronic kidney disease, unspecified: Secondary | ICD-10-CM | POA: Insufficient documentation

## 2023-12-05 DIAGNOSIS — S60221A Contusion of right hand, initial encounter: Secondary | ICD-10-CM | POA: Insufficient documentation

## 2023-12-05 DIAGNOSIS — S0990XA Unspecified injury of head, initial encounter: Secondary | ICD-10-CM | POA: Diagnosis present

## 2023-12-05 LAB — CBC WITH DIFFERENTIAL/PLATELET
Abs Immature Granulocytes: 0.03 10*3/uL (ref 0.00–0.07)
Basophils Absolute: 0 10*3/uL (ref 0.0–0.1)
Basophils Relative: 1 %
Eosinophils Absolute: 0.3 10*3/uL (ref 0.0–0.5)
Eosinophils Relative: 5 %
HCT: 40.7 % (ref 36.0–46.0)
Hemoglobin: 12.8 g/dL (ref 12.0–15.0)
Immature Granulocytes: 1 %
Lymphocytes Relative: 31 %
Lymphs Abs: 1.8 10*3/uL (ref 0.7–4.0)
MCH: 32.2 pg (ref 26.0–34.0)
MCHC: 31.4 g/dL (ref 30.0–36.0)
MCV: 102.5 fL — ABNORMAL HIGH (ref 80.0–100.0)
Monocytes Absolute: 0.6 10*3/uL (ref 0.1–1.0)
Monocytes Relative: 11 %
Neutro Abs: 3.1 10*3/uL (ref 1.7–7.7)
Neutrophils Relative %: 51 %
Platelets: 237 10*3/uL (ref 150–400)
RBC: 3.97 MIL/uL (ref 3.87–5.11)
RDW: 13.7 % (ref 11.5–15.5)
WBC: 5.8 10*3/uL (ref 4.0–10.5)
nRBC: 0 % (ref 0.0–0.2)

## 2023-12-05 LAB — COMPREHENSIVE METABOLIC PANEL WITH GFR
ALT: 23 U/L (ref 0–44)
AST: 21 U/L (ref 15–41)
Albumin: 3.5 g/dL (ref 3.5–5.0)
Alkaline Phosphatase: 126 U/L (ref 38–126)
Anion gap: 9 (ref 5–15)
BUN: 21 mg/dL — ABNORMAL HIGH (ref 6–20)
CO2: 18 mmol/L — ABNORMAL LOW (ref 22–32)
Calcium: 8.5 mg/dL — ABNORMAL LOW (ref 8.9–10.3)
Chloride: 109 mmol/L (ref 98–111)
Creatinine, Ser: 1.22 mg/dL — ABNORMAL HIGH (ref 0.44–1.00)
GFR, Estimated: 51 mL/min — ABNORMAL LOW (ref >60)
Glucose, Bld: 96 mg/dL (ref 70–99)
Potassium: 4.1 mmol/L (ref 3.5–5.1)
Sodium: 136 mmol/L (ref 135–145)
Total Bilirubin: 0.5 mg/dL (ref 0.0–1.2)
Total Protein: 6.4 g/dL — ABNORMAL LOW (ref 6.5–8.1)

## 2023-12-05 MED ORDER — LIDOCAINE-EPINEPHRINE 2 %-1:100000 IJ SOLN
20.0000 mL | Freq: Once | INTRAMUSCULAR | Status: AC
  Administered 2023-12-05: 20 mL
  Filled 2023-12-05: qty 1

## 2023-12-05 NOTE — ED Provider Notes (Signed)
 Mandy Deleon    Event Date/Time   First MD Initiated Contact with Patient 12/05/23 2038     (approximate)   History   Chief Complaint Seizures   HPI  Mandy Deleon is a 58 y.o. female with past medical history of seizures, ulcerative colitis, and GERD who presents to the ED complaining of seizure.  Per EMS, patient had a generalized tonic-clonic seizure lasting about 1 minute just prior to arrival that was witnessed by her husband.  She reportedly fell backwards and struck her head on the floor, was noted to have a laceration.  She was somewhat confused with EMS, but is awake and alert on arrival to the ED.  She complained some of a headache, but denies any other complaints.  She states that she has been compliant with her seizure medications and has already taken her evening dose of medication.  She has otherwise felt well with no recent fevers, cough, chest pain, shortness of breath, nausea, vomiting, diarrhea, or dysuria.     Physical Exam   Triage Vital Signs: ED Triage Vitals  Encounter Vitals Group     BP --      Girls Systolic BP Percentile --      Girls Diastolic BP Percentile --      Boys Systolic BP Percentile --      Boys Diastolic BP Percentile --      Pulse Rate 12/05/23 2045 77     Resp 12/05/23 2045 18     Temp --      Temp src --      SpO2 12/05/23 2045 100 %     Weight 12/05/23 2047 204 lb (92.5 kg)     Height 12/05/23 2047 5' 3 (1.6 m)     Head Circumference --      Peak Flow --      Pain Score 12/05/23 2046 3     Pain Loc --      Pain Education --      Exclude from Growth Chart --     Most recent vital signs: Vitals:   12/05/23 2045 12/05/23 2053  BP:  131/79  Pulse: 77   Resp: 18   Temp:  98.1 F (36.7 C)  SpO2: 99%     Constitutional: Alert and oriented. Eyes: Conjunctivae are normal. Head: Approximately 6 cm linear laceration to left parietal scalp. Nose: No congestion/rhinnorhea. Mouth/Throat:  Mucous membranes are moist.  Neck: No midline cervical spine tenderness to palpation. Cardiovascular: Normal rate, regular rhythm. Grossly normal heart sounds.  2+ radial pulses bilaterally. Respiratory: Normal respiratory effort.  No retractions. Lungs CTAB.  No chest wall tenderness to palpation. Gastrointestinal: Soft and nontender. No distention. Musculoskeletal: No lower extremity tenderness nor edema.  No upper extremity bony tenderness to palpation. Neurologic:  Normal speech and language. No gross focal neurologic deficits are appreciated.    ED Results / Procedures / Treatments   Labs (all labs ordered are listed, but only abnormal results are displayed) Labs Reviewed  CBC WITH DIFFERENTIAL/PLATELET - Abnormal; Notable for the following components:      Result Value   MCV 102.5 (*)    All other components within normal limits  COMPREHENSIVE METABOLIC PANEL WITH GFR - Abnormal; Notable for the following components:   CO2 18 (*)    BUN 21 (*)    Creatinine, Ser 1.22 (*)    Calcium 8.5 (*)    Total Protein 6.4 (*)  GFR, Estimated 51 (*)    All other components within normal limits     EKG  ED ECG REPORT I, Carlin Palin, the attending physician, personally viewed and interpreted this ECG.   Date: 12/05/2023  EKG Time: 20:46  Rate: 69  Rhythm: normal sinus rhythm  Axis: Normal  Intervals:none  ST&T Change: None  RADIOLOGY CT head reviewed and interpreted by me with no hemorrhage or midline shift.  PROCEDURES:  Critical Care performed: No  .Laceration Repair  Date/Time: 12/05/2023 11:20 PM  Performed by: Palin Carlin, MD Authorized by: Palin Carlin, MD   Consent:    Consent obtained:  Verbal   Consent given by:  Patient   Risks, benefits, and alternatives were discussed: yes   Universal protocol:    Patient identity confirmed:  Verbally with patient and arm band Anesthesia:    Anesthesia method:  Local infiltration   Local anesthetic:   Lidocaine  2% WITH epi Laceration details:    Location:  Scalp   Scalp location:  L parietal   Length (cm):  6 Exploration:    Limited defect created (wound extended): no     Hemostasis achieved with:  Direct pressure and epinephrine    Imaging obtained comment:  CT   Imaging outcome: foreign body not noted     Wound exploration: wound explored through full range of motion and entire depth of wound visualized     Wound extent: areolar tissue not violated, fascia not violated, no foreign body, no signs of injury, no nerve damage, no tendon damage, no underlying fracture and no vascular damage     Contaminated: no   Treatment:    Area cleansed with:  Saline   Amount of cleaning:  Standard   Irrigation solution:  Sterile saline   Irrigation method:  Pressure wash   Debridement:  None   Undermining:  None   Scar revision: no   Skin repair:    Repair method:  Staples   Number of staples:  5 Approximation:    Approximation:  Loose Repair type:    Repair type:  Simple Post-procedure details:    Dressing:  Open (no dressing)   Procedure completion:  Tolerated well, no immediate complications    MEDICATIONS ORDERED IN ED: Medications  lidocaine -EPINEPHrine  (XYLOCAINE  W/EPI) 2 %-1:100000 (with pres) injection 20 mL (20 mLs Infiltration Given by Other 12/05/23 2128)     IMPRESSION / MDM / ASSESSMENT AND PLAN / ED COURSE  I reviewed the triage vital signs and the nursing notes.                              58 y.o. female with past medical history of seizures, ulcerative colitis, and GERD who presents to the ED following witnessed generalized seizure at home with fall striking her head.  Patient's presentation is most consistent with acute presentation with potential threat to life or bodily function.  Differential diagnosis includes, but is not limited to, breakthrough seizure, medication noncompliance, electrolyte abnormality, traumatic injury.  Patient nontoxic-appearing and in  no acute distress, vital signs are unremarkable.  Patient awake and alert on arrival to the ED, appears mildly postictal but denies any complaints other than headache and has a nonfocal neurologic exam.  CT head and cervical spine are negative for acute process, laceration to her scalp was repaired with staples.  Labs are reassuring with stable CKD, no significant anemia, leukocytosis, electrolyte abnormality, or AKI.  LFTs are also  unremarkable.  Patient reports compliance with her seizure medications, has been observed here in the ED for almost 3 hours with no additional seizure episodes.  Suspect breakthrough seizure and she is appropriate for discharge home with outpatient neurology follow-up, was counseled to return to the ED for new or worsening symptoms.  Patient and spouse agree with plan.      FINAL CLINICAL IMPRESSION(S) / ED DIAGNOSES   Final diagnoses:  Breakthrough seizure (HCC)  Laceration of scalp, initial encounter     Rx / DC Orders   ED Discharge Orders     None        Deleon:  This document was prepared using Dragon voice recognition software and may include unintentional dictation errors.   Willo Dunnings, MD 12/05/23 2322

## 2023-12-05 NOTE — Discharge Instructions (Addendum)
 Please follow-up with your primary care doctor or at an urgent care in about a week to have the staples removed from your scalp.  You can use the provided splint for comfort on your hand, but fortunately you do not have any sign of broken bones.  You can use over-the-counter pain medication and ice packs to help with the discomfort.  Follow-up with the next available opportunity with your doctor and return to the emergency department with new or worsening symptoms that concern you.

## 2023-12-05 NOTE — ED Triage Notes (Addendum)
 Arrives [facility] from home, witnessed seizure at home, hx of same, compliant with meds. Pt unresponsive on EMS arrival, somewhat post ictal with EMS, acting appropriately on arrival to ER. Lac to head, bleeding controlled. No thinners.pt reports pain to right thumb

## 2023-12-05 NOTE — ED Provider Notes (Signed)
-----------------------------------------   11:52 PM on 12/05/2023 -----------------------------------------  Assuming care from Dr. Willo.  In short, Mandy Deleon is a 58 y.o. female with a chief complaint of seizure and resultant injury.  Refer to the original H&P for additional details.  The current plan of care is to follow up hand x-ray.  Anticipate discharge.   Clinical Course as of 12/06/23 0650  Wed Dec 06, 2023  0110 DG Hand Complete Right I independently viewed and interpreted the patient's hand x-rays and I see no evidence of fracture nor dislocation.  Insert normal report.  Updated patient, discharged with Dr. Jacklin AVS. [CF]    Clinical Course User Index [CF] Gordan Huxley, MD     Medications  lidocaine -EPINEPHrine  (XYLOCAINE  W/EPI) 2 %-1:100000 (with pres) injection 20 mL (20 mLs Infiltration Given by Other 12/05/23 2128)  acetaminophen  (TYLENOL ) tablet 1,000 mg (1,000 mg Oral Given 12/06/23 0047)     ED Discharge Orders     None      Final diagnoses:  Breakthrough seizure (HCC)  Laceration of scalp, initial encounter  Contusion of right hand, initial encounter     Gordan Huxley, MD 12/06/23 [id number]

## 2023-12-06 ENCOUNTER — Emergency Department

## 2023-12-06 DIAGNOSIS — S0101XA Laceration without foreign body of scalp, initial encounter: Secondary | ICD-10-CM | POA: Diagnosis not present

## 2023-12-06 MED ORDER — HYDROCODONE-ACETAMINOPHEN 5-325 MG PO TABS: 2.0000 | ORAL_TABLET | Freq: Once | ORAL | Status: DC

## 2023-12-06 MED ORDER — ACETAMINOPHEN 500 MG PO TABS
1000.0000 mg | ORAL_TABLET | Freq: Once | ORAL | Status: AC
  Administered 2023-12-06: 1000 mg via ORAL
  Filled 2023-12-06: qty 2

## 2023-12-06 NOTE — ED Notes (Signed)
 Patient discharged home in good condition, d/c papers provided and reviewed, IV d/cd. Pt with wrist splint to right wrist. Wheelchair to discharge by staff member accompanied by spouse.

## 2023-12-14 ENCOUNTER — Ambulatory Visit: Admitting: Family Medicine

## 2023-12-14 ENCOUNTER — Encounter: Payer: Self-pay | Admitting: Family Medicine

## 2023-12-14 VITALS — BP 102/79 | HR 86 | Temp 98.1°F | Ht 63.0 in | Wt 187.9 lb

## 2023-12-14 DIAGNOSIS — G40909 Epilepsy, unspecified, not intractable, without status epilepticus: Secondary | ICD-10-CM | POA: Diagnosis not present

## 2023-12-14 DIAGNOSIS — Z4802 Encounter for removal of sutures: Secondary | ICD-10-CM | POA: Diagnosis not present

## 2023-12-14 NOTE — Progress Notes (Signed)
 Acute Office Visit  Introduced to nurse practitioner role and practice setting.  All questions answered.  Discussed provider/patient relationship and expectations.   Subjective:     Patient ID: Mandy Deleon, female    DOB: [birth year], 58 y.o.   MRN: [id number]  Chief Complaint  Patient presents with   Hospitalization Follow-up    Patient was not hospitalized, she went to ER and was treated there.  Patient had a seizure, she hit her head on the dresser which caused her of having to have staples to her scalp.   Discussed the use of AI scribe software for clinical note transcription with the patient, who gave verbal consent to proceed.  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 She is accompanied by her husband. Staples were placed in ED on 12/05/23. Five staples were placed, discharged included staple removal in one week. Today is 12/14/23, 9 days later.   She was unable to have them removed at her neurologist's office due to lack of equipment.  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 Since then, she has experienced seizures with varying frequency. During a recent trip to [place], she had a seizure while on a carriage ride.    Review of Systems  All other systems reviewed and are negative.       Objective:    BP 102/79 (BP Location: Left Arm, Patient Position: Sitting, Cuff Size: Normal)   Pulse 86   Temp 98.1 F (36.7 C) (Oral)   Ht 5' 3 (1.6 m)   Wt 187 lb 14.4 oz (85.2 kg)   SpO2 97%   BMI 33.28 kg/m    Physical Exam Constitutional:      General: She is not in acute distress.    Appearance: Normal appearance. She is obese. She is not toxic-appearing or diaphoretic.  HENT:     Head:  Normocephalic.     Nose: Nose normal.     Mouth/Throat:     Mouth: Mucous membranes are moist.     Pharynx: Oropharynx is clear.  Eyes:     Extraocular Movements: Extraocular movements intact.     Pupils: Pupils are equal, round, and reactive to light.  Cardiovascular:     Rate and Rhythm: Normal rate and regular rhythm.     Pulses: Normal pulses.     Heart sounds: Normal heart sounds. No murmur heard.    No friction rub. No gallop.  Pulmonary:     Effort: No respiratory distress.     Breath sounds: No stridor. No wheezing, rhonchi or rales.  Chest:     Chest wall: No tenderness.  Musculoskeletal:     Right lower leg: No edema.     Left lower leg: No edema.  Skin:    General: Skin is warm and dry.     Capillary Refill: Capillary refill takes less than 2 seconds.     Findings: Wound present.     Comments: Healing laceration, superior scalp, 6cm in length, 5 staples presents. Scab present, no drainage, edema, or erythema present. No warmth at site. Appears to have healed well  Neurological:     General: No focal deficit present.     Mental Status: She is alert and oriented to person, place,  and time. Mental status is at baseline.  Psychiatric:        Attention and Perception: Attention and perception normal.        Mood and Affect: Mood and affect normal.        Speech: Speech is delayed.        Behavior: Behavior is slowed.        Thought Content: Thought content normal.        Judgment: Judgment normal.     No results found for any visits on 12/14/23.      Assessment & Plan:  Assessment and Plan Assessment & Plan Staple removal Area well-healed, some scabbing, no erythema, edema, drainage. - Cleanse area with betadine prior to removal - Removed 5 staples from scalp using staple removal. - May apply petrolatum to moisturize area. - Advise washing hair with shampoo and conditioner, avoid scab. - Advise against wearing hat outside until scab resolves. - If you start  to develop fever, drainage, erythema, bleeding, swelling at site, please follow up sooner.   Seizure Disorder - follow up with neurology for mgmt - advised on fall risks   Problem List Items Addressed This Visit       Nervous and Auditory   Seizure disorder [facility])   Other Visit Diagnoses       Encounter for staple removal    -  Primary       No orders of the defined types were placed in this encounter.   Return if symptoms worsen or fail to improve.  Curtis DELENA Boom, FNP  I, Curtis DELENA Boom, FNP, have reviewed all documentation for this visit. The documentation on 12/14/23 for the exam, diagnosis, procedures, and orders are all accurate and complete.

## 2024-01-12 ENCOUNTER — Other Ambulatory Visit: Payer: Self-pay | Admitting: Family Medicine

## 2024-01-22 ENCOUNTER — Other Ambulatory Visit: Payer: Self-pay | Admitting: Family Medicine

## 2024-03-26 ENCOUNTER — Ambulatory Visit: Admitting: Family Medicine

## 2024-03-26 ENCOUNTER — Encounter: Payer: Self-pay | Admitting: Family Medicine

## 2024-03-26 VITALS — BP 104/78 | HR 84 | Ht 63.0 in | Wt 195.3 lb

## 2024-03-26 DIAGNOSIS — E782 Mixed hyperlipidemia: Secondary | ICD-10-CM | POA: Diagnosis not present

## 2024-03-26 DIAGNOSIS — Z23 Encounter for immunization: Secondary | ICD-10-CM

## 2024-03-26 DIAGNOSIS — N3946 Mixed incontinence: Secondary | ICD-10-CM

## 2024-03-26 DIAGNOSIS — N1831 Chronic kidney disease, stage 3a: Secondary | ICD-10-CM | POA: Diagnosis not present

## 2024-03-26 DIAGNOSIS — K219 Gastro-esophageal reflux disease without esophagitis: Secondary | ICD-10-CM

## 2024-03-26 NOTE — Progress Notes (Signed)
 Established patient visit   Patient: Mandy Deleon   DOB: [birth year]   58 y.o. Female  MRN: [id number] Visit Date: 03/26/2024  Today's healthcare provider: Jon Eva, MD   Chief Complaint  Patient presents with   Medical Management of Chronic Issues   Subjective    HPI   Discussed the use of AI scribe software for clinical note transcription with the patient, who gave verbal consent to proceed.  History of Present Illness   Mandy Deleon is a 58 year old female with epilepsy and chronic kidney disease who presents for a follow-up visit. She is accompanied by her husband.  She has experienced multiple falls recently, with three occurring last week and one yesterday, resulting in a bruise but no serious injuries. She attributes one fall to a shifted mattress and another to avoiding stepping on her cat.  She continues to experience seizures and occasionally uses Ensure drinks after seizures due to difficulty eating from tongue biting.  She is taking oxybutynin  for mixed stress and urge incontinence, which is effective. A PPI has resolved her acid reflux symptoms.  Recent lab work showed mildly elevated kidney function markers, and her GFR is around 50-60. She has reduced her Ensure intake to once or twice a week.  She inquires about receiving a shingles vaccine and is due for a tetanus shot, with her last dose in 2012.         Medications: Outpatient Medications Prior to Visit  Medication Sig   acetaminophen  (TYLENOL ) 500 MG tablet Take 1,000 mg by mouth every 6 (six) hours as needed (pain.).    Cenobamate (XCOPRI) 100 MG TABS Take by mouth at bedtime. 100mg  every morning, 200mg  qhs, except 300mg  qhs on Wed (Patient taking differently: Take 100 mg by mouth in the morning and at bedtime. 150mg  every morning, 200mg  at night)   cetirizine (ZYRTEC) 10 MG tablet TAKE ONE TABLET BY MOUTH ONCE DAILY   cholecalciferol (VITAMIN D3) 25 MCG (1000 UNIT) tablet Take  1,000 Units by mouth daily.   Fish Oil-Cholecalciferol (OMEGA-3 + D PO) Take 1 capsule by mouth daily in the afternoon.   fluticasone  (FLONASE ) 50 MCG/ACT nasal spray Place 2 sprays into both nostrils daily.   lansoprazole  (PREVACID ) 15 MG capsule Take 1 capsule (15 mg total) by mouth daily.   levETIRAcetam  (KEPPRA ) 750 MG tablet Take 750 mg by mouth 2 (two) times daily.   Melatonin 5 MG CAPS Take 5 mg by mouth at bedtime.    Methsuximide  (CELONTIN ) 300 MG CAPS Take 300 mg by mouth 2 (two) times a week.   ondansetron  (ZOFRAN ) 4 MG tablet Take 1 tablet (4 mg total) by mouth every 8 (eight) hours as needed for nausea or vomiting.   ondansetron  (ZOFRAN -ODT) 4 MG disintegrating tablet Take 1 tablet (4 mg total) by mouth every 8 (eight) hours as needed for nausea or vomiting.   oxybutynin  (DITROPAN -XL) 10 MG 24 hr tablet TAKE ONE TABLET BY MOUTH EVERY NIGHT AT BEDTIME   phenazopyridine (PYRIDIUM) 95 MG tablet Take 95 mg by mouth as needed for pain.   Phenylephrine-DM-GG-APAP (DELSYM COUGH/COLD DAYTIME PO) Take by mouth.   zonisamide  (ZONEGRAN ) 100 MG capsule Take 1 capsule (100 mg total) by mouth in the morning, at noon, and at bedtime.   No facility-administered medications prior to visit.    Review of Systems     Objective    BP 104/78 (BP Location: Left Arm, Patient Position: Sitting, Cuff Size:  Normal)   Pulse 84   Ht 5' 3 (1.6 m)   Wt 195 lb 4.8 oz (88.6 kg)   SpO2 99%   BMI 34.60 kg/m    Physical Exam Vitals reviewed.  Constitutional:      General: She is not in acute distress.    Appearance: Normal appearance. She is well-developed. She is not diaphoretic.  HENT:     Head: Normocephalic and atraumatic.  Eyes:     General: No scleral icterus.    Conjunctiva/sclera: Conjunctivae normal.  Neck:     Thyroid: No thyromegaly.  Cardiovascular:     Rate and Rhythm: Normal rate and regular rhythm.     Heart sounds: Normal heart sounds. No murmur heard. Pulmonary:     Effort:  Pulmonary effort is normal. No respiratory distress.     Breath sounds: Normal breath sounds. No wheezing, rhonchi or rales.  Musculoskeletal:     Cervical back: Neck supple.     Right lower leg: No edema.     Left lower leg: No edema.  Lymphadenopathy:     Cervical: No cervical adenopathy.  Skin:    General: Skin is warm and dry.     Findings: No rash.  Neurological:     Mental Status: She is alert and oriented to person, place, and time. Mental status is at baseline.  Psychiatric:        Mood and Affect: Mood normal.        Behavior: Behavior normal.      The 10-year ASCVD risk score (Arnett DK, et al., 2019) is: 1.8%   No results found for any visits on 03/26/24.  Assessment & Plan     Problem List Items Addressed This Visit       Digestive   Gastroesophageal reflux disease without esophagitis - Primary   Well-controlled with lansoprazole , with no current symptoms of acid reflux. - Continue lansoprazole  for gastroesophageal reflux disease.        Genitourinary   Stage 3a chronic kidney disease (HCC)   Chronic kidney disease stage 2-3a with mildly elevated kidney function tests. Recent reduction in Ensure consumption may improve kidney function. - Ordered blood and urine tests to assess kidney function and protein levels.      Relevant Orders   Urine Microalbumin w/creat. ratio   Basic Metabolic Panel (BMET)     Other   Mixed stress and urge urinary incontinence   Managed with oxybutynin , effectively controlling symptoms. - Continue oxybutynin  for urinary incontinence.      Moderate mixed hyperlipidemia not requiring statin therapy   Slightly elevated cholesterol levels with a 10-year ASCVD risk of 1.8%, not requiring medication.      Other Visit Diagnoses       Immunization due       Relevant Orders   Flu vaccine trivalent PF, 6mos and older(Flulaval,Afluria,Fluarix,Fluzone)   Pneumococcal conjugate vaccine 20-valent           Falls Recent  falls with minor injuries, including bruising. Falls may be related to seizures or environmental factors such as pets and unstable surfaces.  General Health Maintenance Discussion of vaccinations including flu, pneumonia, shingles, and tetanus. Shingles vaccine to be obtained at the hospital pharmacy. Tetanus vaccine status to be confirmed. - Administered flu and pneumonia vaccines today. - Schedule shingles vaccine at the hospital pharmacy in a couple of weeks. - Confirm tetanus vaccine status and update if necessary.        Return in about 6 months (around  09/24/2024) for CPE.       Jon Eva, MD  [facility] [phone] (phone) [phone] (fax)  [facility]

## 2024-03-26 NOTE — Assessment & Plan Note (Signed)
 Managed with oxybutynin , effectively controlling symptoms. - Continue oxybutynin  for urinary incontinence.

## 2024-03-26 NOTE — Assessment & Plan Note (Signed)
 Slightly elevated cholesterol levels with a 10-year ASCVD risk of 1.8%, not requiring medication.

## 2024-03-26 NOTE — Assessment & Plan Note (Signed)
 Chronic kidney disease stage 2-3a with mildly elevated kidney function tests. Recent reduction in Ensure consumption may improve kidney function. - Ordered blood and urine tests to assess kidney function and protein levels.

## 2024-03-26 NOTE — Assessment & Plan Note (Signed)
 Well-controlled with lansoprazole , with no current symptoms of acid reflux. - Continue lansoprazole  for gastroesophageal reflux disease.

## 2024-03-27 LAB — BASIC METABOLIC PANEL WITH GFR
BUN/Creatinine Ratio: 21 (ref 9–23)
BUN: 22 mg/dL (ref 6–24)
CO2: 19 mmol/L — ABNORMAL LOW (ref 20–29)
Calcium: 8.8 mg/dL (ref 8.7–10.2)
Chloride: 106 mmol/L (ref 96–106)
Creatinine, Ser: 1.03 mg/dL — ABNORMAL HIGH (ref 0.57–1.00)
Glucose: 86 mg/dL (ref 70–99)
Potassium: 4.3 mmol/L (ref 3.5–5.2)
Sodium: 140 mmol/L (ref 134–144)
eGFR: 63 mL/min/1.73 (ref >59)

## 2024-03-27 LAB — MICROALBUMIN / CREATININE URINE RATIO
Creatinine, Urine: 96.7 mg/dL
Microalb/Creat Ratio: 5 mg/g{creat} (ref 0–29)
Microalbumin, Urine: 4.9 ug/mL

## 2024-03-29 ENCOUNTER — Ambulatory Visit: Payer: Self-pay | Admitting: Family Medicine

## 2024-04-09 ENCOUNTER — Other Ambulatory Visit: Payer: Self-pay

## 2024-04-09 ENCOUNTER — Other Ambulatory Visit (HOSPITAL_COMMUNITY): Payer: Self-pay

## 2024-04-09 MED ORDER — SHINGRIX 50 MCG/0.5ML IM SUSR
INTRAMUSCULAR | 1 refills | Status: AC
  Filled 2024-04-09: qty 0.5, 1d supply, fill #0
  Filled 2024-06-11: qty 0.5, 1d supply, fill #1

## 2024-05-07 ENCOUNTER — Other Ambulatory Visit: Payer: Self-pay | Admitting: Family Medicine

## 2024-06-11 ENCOUNTER — Other Ambulatory Visit: Payer: Self-pay

## 2024-07-09 ENCOUNTER — Other Ambulatory Visit: Payer: Self-pay

## 2024-07-15 ENCOUNTER — Ambulatory Visit: Admitting: Family Medicine

## 2024-08-08 ENCOUNTER — Ambulatory Visit: Admitting: Family Medicine

## 2024-09-26 ENCOUNTER — Encounter: Admitting: Family Medicine
